# Patient Record
Sex: Female | Born: 1990 | Race: White | Hispanic: No | Marital: Single | Smoking: Never smoker
Health system: Southern US, Community
[De-identification: ages and names within clinical notes are randomized; demographics above are authoritative.]

## PROBLEM LIST (undated history)

## (undated) DIAGNOSIS — E66811 Obesity, class 1: Secondary | ICD-10-CM

## (undated) DIAGNOSIS — Z8742 Personal history of other diseases of the female genital tract: Secondary | ICD-10-CM

## (undated) DIAGNOSIS — T8859XA Other complications of anesthesia, initial encounter: Secondary | ICD-10-CM

## (undated) DIAGNOSIS — Z8619 Personal history of other infectious and parasitic diseases: Secondary | ICD-10-CM

## (undated) DIAGNOSIS — Z9289 Personal history of other medical treatment: Secondary | ICD-10-CM

## (undated) DIAGNOSIS — E119 Type 2 diabetes mellitus without complications: Secondary | ICD-10-CM

## (undated) DIAGNOSIS — Z8489 Family history of other specified conditions: Secondary | ICD-10-CM

## (undated) DIAGNOSIS — E669 Obesity, unspecified: Secondary | ICD-10-CM

## (undated) DIAGNOSIS — E7800 Pure hypercholesterolemia, unspecified: Secondary | ICD-10-CM

## (undated) DIAGNOSIS — T4145XA Adverse effect of unspecified anesthetic, initial encounter: Secondary | ICD-10-CM

## (undated) HISTORY — DX: Personal history of other diseases of the female genital tract: Z87.42

## (undated) HISTORY — DX: Type 2 diabetes mellitus without complications: E11.9

## (undated) HISTORY — DX: Personal history of other medical treatment: Z92.89

## (undated) HISTORY — DX: Obesity, class 1: E66.811

## (undated) HISTORY — DX: Obesity, unspecified: E66.9

## (undated) HISTORY — PX: VAGINA SURGERY: SHX829

## (undated) HISTORY — DX: Personal history of other infectious and parasitic diseases: Z86.19

## (undated) HISTORY — PX: WISDOM TOOTH EXTRACTION: SHX21

---
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
## 2010-05-29 ENCOUNTER — Emergency Department: Payer: Self-pay | Admitting: Emergency Medicine

## 2010-07-15 ENCOUNTER — Ambulatory Visit: Payer: Self-pay

## 2010-08-13 ENCOUNTER — Ambulatory Visit: Payer: Self-pay

## 2012-09-28 ENCOUNTER — Ambulatory Visit: Payer: Self-pay | Admitting: Unknown Physician Specialty

## 2013-07-16 ENCOUNTER — Emergency Department: Payer: Self-pay | Admitting: Emergency Medicine

## 2013-07-16 LAB — URINALYSIS, COMPLETE
BACTERIA: NONE SEEN
Bilirubin,UR: NEGATIVE
Glucose,UR: NEGATIVE mg/dL (ref 0–75)
NITRITE: NEGATIVE
Ph: 6 (ref 4.5–8.0)
Protein: NEGATIVE
SPECIFIC GRAVITY: 1.021 (ref 1.003–1.030)
WBC UR: 3 /HPF (ref 0–5)

## 2013-07-16 LAB — CBC
HCT: 36.6 % (ref 35.0–47.0)
HGB: 12.4 g/dL (ref 12.0–16.0)
MCH: 28.9 pg (ref 26.0–34.0)
MCHC: 33.8 g/dL (ref 32.0–36.0)
MCV: 85 fL (ref 80–100)
PLATELETS: 317 10*3/uL (ref 150–440)
RBC: 4.29 10*6/uL (ref 3.80–5.20)
RDW: 14.1 % (ref 11.5–14.5)
WBC: 7.2 10*3/uL (ref 3.6–11.0)

## 2013-07-16 LAB — BASIC METABOLIC PANEL
Anion Gap: 8 (ref 7–16)
BUN: 17 mg/dL (ref 7–18)
CREATININE: 0.8 mg/dL (ref 0.60–1.30)
Calcium, Total: 8.6 mg/dL (ref 8.5–10.1)
Chloride: 107 mmol/L (ref 98–107)
Co2: 22 mmol/L (ref 21–32)
EGFR (African American): >60
EGFR (Non-African Amer.): >60
Glucose: 75 mg/dL (ref 65–99)
Osmolality: 274 (ref 275–301)
Potassium: 3.2 mmol/L — ABNORMAL LOW (ref 3.5–5.1)
Sodium: 137 mmol/L (ref 136–145)

## 2013-07-16 LAB — HCG, QUANTITATIVE, PREGNANCY: Beta Hcg, Quant.: 4169 m[IU]/mL — ABNORMAL HIGH

## 2013-07-16 LAB — GC/CHLAMYDIA PROBE AMP

## 2013-07-16 LAB — WET PREP, GENITAL

## 2013-10-19 ENCOUNTER — Emergency Department: Payer: Self-pay | Admitting: Emergency Medicine

## 2013-10-20 LAB — BASIC METABOLIC PANEL
Anion Gap: 7 (ref 7–16)
BUN: 19 mg/dL — ABNORMAL HIGH (ref 7–18)
Calcium, Total: 8.9 mg/dL (ref 8.5–10.1)
Chloride: 104 mmol/L (ref 98–107)
Co2: 28 mmol/L (ref 21–32)
Creatinine: 0.91 mg/dL (ref 0.60–1.30)
EGFR (Non-African Amer.): >60
Glucose: 77 mg/dL (ref 65–99)
Osmolality: 279 (ref 275–301)
POTASSIUM: 3.8 mmol/L (ref 3.5–5.1)
Sodium: 139 mmol/L (ref 136–145)

## 2013-10-20 LAB — CBC
HCT: 40 % (ref 35.0–47.0)
HGB: 13 g/dL (ref 12.0–16.0)
MCH: 28.7 pg (ref 26.0–34.0)
MCHC: 32.6 g/dL (ref 32.0–36.0)
MCV: 88 fL (ref 80–100)
Platelet: 291 10*3/uL (ref 150–440)
RBC: 4.54 10*6/uL (ref 3.80–5.20)
RDW: 13.5 % (ref 11.5–14.5)
WBC: 7.4 10*3/uL (ref 3.6–11.0)

## 2013-10-20 LAB — TROPONIN I: Troponin-I: <0.02 ng/mL

## 2014-01-12 ENCOUNTER — Encounter: Payer: Self-pay | Admitting: Maternal & Fetal Medicine

## 2014-02-28 ENCOUNTER — Ambulatory Visit: Payer: Self-pay | Admitting: Obstetrics and Gynecology

## 2014-03-07 ENCOUNTER — Encounter: Payer: Self-pay | Admitting: Pediatric Cardiology

## 2014-03-14 ENCOUNTER — Ambulatory Visit: Payer: Self-pay | Admitting: Obstetrics and Gynecology

## 2014-03-15 ENCOUNTER — Observation Stay: Payer: Self-pay

## 2014-03-15 LAB — URINALYSIS, COMPLETE
BILIRUBIN, UR: NEGATIVE
Blood: NEGATIVE
Glucose,UR: NEGATIVE mg/dL (ref 0–75)
Hyaline Cast: 2
Ketone: NEGATIVE
NITRITE: NEGATIVE
PH: 7 (ref 4.5–8.0)
Protein: NEGATIVE
SPECIFIC GRAVITY: 1.014 (ref 1.003–1.030)
WBC UR: 64 /HPF (ref 0–5)

## 2014-04-18 ENCOUNTER — Observation Stay: Payer: Self-pay | Admitting: Obstetrics and Gynecology

## 2014-04-18 LAB — FETAL FIBRONECTIN
Appearance: NORMAL
Fetal Fibronectin: NEGATIVE

## 2014-04-18 LAB — URINALYSIS, COMPLETE
BLOOD: NEGATIVE
Bilirubin,UR: NEGATIVE
Glucose,UR: 50 mg/dL (ref 0–75)
Ketone: NEGATIVE
NITRITE: NEGATIVE
PH: 7 (ref 4.5–8.0)
PROTEIN: NEGATIVE
RBC,UR: 2 /HPF (ref 0–5)
SPECIFIC GRAVITY: 1.008 (ref 1.003–1.030)
Squamous Epithelial: 1
WBC UR: 7 /HPF (ref 0–5)

## 2014-04-18 LAB — WET PREP, GENITAL

## 2014-04-18 LAB — GC/CHLAMYDIA PROBE AMP

## 2014-04-20 LAB — URINE CULTURE

## 2014-05-13 ENCOUNTER — Observation Stay: Payer: Self-pay

## 2014-05-13 LAB — URINALYSIS, COMPLETE
Bilirubin,UR: NEGATIVE
Glucose,UR: NEGATIVE mg/dL (ref 0–75)
NITRITE: NEGATIVE
PROTEIN: NEGATIVE
Ph: 7 (ref 4.5–8.0)
Specific Gravity: 1.008 (ref 1.003–1.030)
Squamous Epithelial: 6

## 2014-05-15 LAB — URINE CULTURE

## 2014-06-01 ENCOUNTER — Observation Stay: Payer: Self-pay | Admitting: Obstetrics and Gynecology

## 2014-06-29 ENCOUNTER — Inpatient Hospital Stay: Payer: Self-pay | Admitting: Obstetrics and Gynecology

## 2014-08-22 NOTE — H&P (Signed)
L&D Evaluation:  History:  HPI 24 y/o G2P0010 @ 32wks EDC 07/07/14. Here with complaints of pelvic pressure, cramping and pink spotting with wiping. Care @ [facility]. Pregnancy complicated by Type I DM. 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 Denies leaking fluid or red vaginal bleeding, uti sx, baby is active. No recent IC. Blood sugars within normal range.   Presents with above   Patient's Medical History Diabetes  Obesity   Patient's Surgical History none   Medications Pre Natal Vitamins   Allergies NKDA   Social History none   Family History Non-Contributory   ROS:  ROS All systems were reviewed.  HEENT, CNS, GI, GU, Respiratory, CV, Renal and Musculoskeletal systems were found to be normal.   Exam:  Vital Signs stable   Urine Protein not completed   General no apparent distress   Mental Status clear   Chest clear   Heart normal sinus rhythm   Abdomen gravid, non-tender   Estimated Fetal Weight Average for gestational age   Fundal Height 32wks   Edema no edema   Reflexes 1+   Clonus negative   Pelvic no external lesions, cervix closed and thick, LTC PPOOP BOWI  no pink or red bleeding   Mebranes Intact   FHT normal rate with no decels, baseline 130's 140's avg variabililty nl accels   Fetal Heart Rate 136   Ucx rare irritability   Impression:  Impression 32wks cramping   Plan:  Plan EFM/NST, monitor contractions and for cervical change   Comments Occasional irritability per EFM, uterus soft. UA returned 1+ blood, negative protein, leukocytes 2+, bacteria 1+ epithelial 6. Will save for culture. Advised working no longer than 8hrs per shift, which may reduce as pregnancy progresses. Note given for rest tomorrow. Has BW NST and growth US this week. Normal blood sugars at office 05/12/14.   Electronic Signatures: Albertina ParrLugiano, Jashanti Clinkscale B (CNM)  (Signed 30-Jan-16 16:07)  Authored: L&D Evaluation   Last Updated: 30-Jan-16 16:07 by Albertina ParrLugiano, Setsuko Robins B  (CNM)

## 2014-08-22 NOTE — H&P (Signed)
L&D Evaluation:  History:  HPI 24 yo G2P0010 at 38+6 by 14 wk US with T1DM presenting for IOL. No complaints currently. FSBS have been well controlled on Humalog 4/4/8 and Lantus 22 QHS.   Patient's Medical History Diabetes  HLD, Obesity, Kidney stones, mild mitral reguritation   Patient's Surgical History none   Medications Pre Natal Vitamins   Allergies PCN, hives   Social History none   Family History Non-Contributory   ROS:  ROS All systems were reviewed.  HEENT, CNS, GI, GU, Respiratory, CV, Renal and Musculoskeletal systems were found to be normal.   Exam:  Vital Signs stable   General no apparent distress   Mental Status clear   Chest clear   Heart normal sinus rhythm   Abdomen gravid, non-tender   Estimated Fetal Weight Average for gestational age, US 7049th percentile   Fetal Position cephalic   Back no CVAT   Pelvic no external lesions   Mebranes Intact   FHT normal rate with no decels   Plan:  Comments 1. IOL: Plan for cervidil overnight.  2. FWB: Reassuring 3. DM: Lantus 11 tonight. Check FSBS Q4 in early induction. Call MD if >110. Clear liquids.   Electronic Signatures: Areta HaberGoodman, David M (MD)  (Signed 17-Mar-16 20:47)  Authored: L&D Evaluation   Last Updated: 17-Mar-16 20:47 by Areta HaberGoodman, David M (MD)

## 2015-08-13 DIAGNOSIS — Z8619 Personal history of other infectious and parasitic diseases: Secondary | ICD-10-CM

## 2015-08-13 HISTORY — DX: Personal history of other infectious and parasitic diseases: Z86.19

## 2015-08-30 LAB — HM PAP SMEAR: HM Pap smear: NEGATIVE

## 2016-07-03 ENCOUNTER — Ambulatory Visit (INDEPENDENT_AMBULATORY_CARE_PROVIDER_SITE_OTHER): Payer: BLUE CROSS/BLUE SHIELD | Admitting: Obstetrics & Gynecology

## 2016-07-03 ENCOUNTER — Encounter: Payer: Self-pay | Admitting: Obstetrics & Gynecology

## 2016-07-03 VITALS — BP 100/60 | HR 102 | Ht 68.0 in | Wt 218.0 lb

## 2016-07-03 DIAGNOSIS — N939 Abnormal uterine and vaginal bleeding, unspecified: Secondary | ICD-10-CM | POA: Diagnosis not present

## 2016-07-03 NOTE — Progress Notes (Signed)
  HPI:      [name] is a 26 y.o. G1P1001 who LMP was No LMP recorded., presents today for a problem visit.  She complains of 2 day h/o bleeding w cramping after no period for > 1 year w Mirena (x 2 years).  Also some odor w bloody d/c. that  began 2 days ago and its severity is described as moderate.  She has no periods w Mirena, and her pain recently is described as mild menstrual cramping.  She has used the following for attempts at control: thin pad.  Previous evaluation: none. Prior Diagnosis: abnormal bleeding on hormone replacement therapy. Previous Treatment: IUD.  She is single partner, contraception - IUD.  Contraception: IUD. Hx of STDs: none. She is premenopausal.  PMHx: She  has a past medical history of Diabetes mellitus, type 2 (HCC); History of chlamydia; and History of vaginitis. Also,  has no past surgical history on file., family history includes Breast cancer in her paternal grandmother; Heart disease in her maternal grandmother; Hypertension in her mother; Prostate cancer in her paternal grandfather.,  reports that she has never smoked. She has never used smokeless tobacco. She reports that she drinks alcohol. She reports that she does not use drugs.  She has a current medication list which includes the following prescription(s): glucocom blood glucose monitor, insulin glargine, insulin pen needle, levonorgestrel, phentermine, humalog kwikpen, lantus solostar, and simvastatin. Also, is allergic to penicillins and amoxicillin.  Review of Systems  Constitutional: Negative for chills, fever and malaise/fatigue.  HENT: Negative for congestion, sinus pain and sore throat.   Eyes: Negative for blurred vision and pain.  Respiratory: Negative for cough and wheezing.   Cardiovascular: Negative for chest pain and leg swelling.  Gastrointestinal: Negative for abdominal pain, constipation, diarrhea, heartburn, nausea and vomiting.  Genitourinary: Negative for dysuria,  frequency, hematuria and urgency.  Musculoskeletal: Negative for back pain, joint pain, myalgias and neck pain.  Skin: Negative for itching and rash.  Neurological: Negative for dizziness, tremors and weakness.  Endo/Heme/Allergies: Does not bruise/bleed easily.  Psychiatric/Behavioral: Negative for depression. The patient is not nervous/anxious and does not have insomnia.     Objective: BP 100/60   Pulse (!) 102   Ht 5\' 8"  (1.727 m)   Wt 218 lb (98.9 kg)   BMI 33.15 kg/m  Physical Exam  Constitutional: She is oriented to person, place, and time. She appears well-developed and well-nourished. No distress.  Genitourinary: Vagina normal. Pelvic exam was performed with patient supine. There is no rash, tenderness or lesion on the right labia. There is no rash, tenderness or lesion on the left labia. No erythema or bleeding in the vagina.  Genitourinary Comments: IUD strings 1 cm  Abdominal: Soft. She exhibits no distension. There is no tenderness.  Musculoskeletal: Normal range of motion.  Neurological: She is alert and oriented to person, place, and time. No cranial nerve deficit.  Skin: Skin is warm and dry.  Psychiatric: She has a normal mood and affect.    ASSESSMENT/PLAN:  abnormal bleeding on hormone replacement therapy. On IUD.  No other s/sx Infection, mass, inflammation. Monitor for persistance of AUB.  US if so. IUD in place today.  Annamarie MajorPaul Dereke Neumann, MD, Merlinda FrederickFACOG [facility], [facility] 07/03/2016  4:32 PM

## 2016-07-21 ENCOUNTER — Encounter: Payer: Self-pay | Admitting: Certified Nurse Midwife

## 2016-07-21 ENCOUNTER — Ambulatory Visit (INDEPENDENT_AMBULATORY_CARE_PROVIDER_SITE_OTHER): Payer: BLUE CROSS/BLUE SHIELD | Admitting: Certified Nurse Midwife

## 2016-07-21 VITALS — BP 112/72 | HR 98 | Ht 67.0 in | Wt 216.0 lb

## 2016-07-21 DIAGNOSIS — R102 Pelvic and perineal pain: Secondary | ICD-10-CM

## 2016-07-21 DIAGNOSIS — N898 Other specified noninflammatory disorders of vagina: Secondary | ICD-10-CM

## 2016-07-21 DIAGNOSIS — B9689 Other specified bacterial agents as the cause of diseases classified elsewhere: Secondary | ICD-10-CM | POA: Diagnosis not present

## 2016-07-21 DIAGNOSIS — N750 Cyst of Bartholin's gland: Secondary | ICD-10-CM | POA: Diagnosis not present

## 2016-07-21 DIAGNOSIS — Z202 Contact with and (suspected) exposure to infections with a predominantly sexual mode of transmission: Secondary | ICD-10-CM | POA: Diagnosis not present

## 2016-07-21 DIAGNOSIS — N760 Acute vaginitis: Secondary | ICD-10-CM

## 2016-07-21 MED ORDER — TINIDAZOLE 500 MG PO TABS: 1000.0000 mg | ORAL_TABLET | Freq: Every day | ORAL | 0 refills | Status: AC

## 2016-07-21 MED ORDER — AZITHROMYCIN 500 MG PO TABS: 1000.0000 mg | ORAL_TABLET | Freq: Once | ORAL | 0 refills | Status: AC

## 2016-07-21 MED ORDER — CEFTRIAXONE SODIUM 250 MG IJ SOLR
250.0000 mg | Freq: Once | INTRAMUSCULAR | Status: AC
  Administered 2016-07-21: 250 mg via INTRAMUSCULAR

## 2016-07-21 NOTE — Progress Notes (Addendum)
  Gynecology STD Evaluation   Chief Complaint:  Chief Complaint  Patient presents with  . Exposure to STD    History of Present Illness: Patient is a 26 y.o. G1P1001 presents for STD testing.  Her boyfriend of 1 year has not been monogamous and has recently been diagnosed with gonorrhea. She has noticed some increased vaginal discharge and vaginal pain/discomfort. Denies vulvar itching or vaginal bleeding. Her current form of contraception is a Mirena IUD.   PMHx: She  has a past medical history of Diabetes mellitus, type 2 (HCC); History of chlamydia; and History of vaginitis. Also,  has no past surgical history on file., family history includes Breast cancer in her paternal grandmother; Heart disease in her maternal grandmother; Hypertension in her mother; Prostate cancer in her paternal grandfather.,  reports that she has never smoked. She has never used smokeless tobacco. She reports that she drinks alcohol. She reports that she does not use drugs. Allergies  Allergen Reactions  . Penicillins   . Amoxicillin Hives and Rash    (rash 09/1996)  Current medications include Toujeo, simvastatin, Jardiance, and insulin  Review of Systems  Constitutional: Negative for chills and fever.  Gastrointestinal: Negative for abdominal pain.  Genitourinary: Negative for dysuria.       Positive for vaginal discharge and vaginal pain. No vulvar itching or irritation  Skin: Negative for rash.     Objective: BP 112/72   Pulse 98   Ht  (1.702 m)   Wt 98 kg (216 lb)   BMI 33.83 kg/m  Physical Exam  Constitutional: She is oriented to person, place, and time. She appears well-developed and well-nourished. No distress.  Genitourinary:  Genitourinary Comments: Vulva: no inflammation or lesions Right Bartholin Cyst, acorn size (2cm), NT Vagina: thick grey mucopurulent discharge Cervix: IUD strings visible, no inflammation, NT Uterus: NSSC, mobile, NT Adnexa: no masses, NT  Abdominal: There  is no tenderness.  Neurological: She is alert and oriented to person, place, and time.  Skin: Skin is warm and dry.  Psychiatric: She has a normal mood and affect.   Wet prep positive for clue cells and TNTC WBCS. No hyphae or Trichimonas seen  Assessment: 26 y.o. G1P1001 with exposure to gonorrhea and mucopurulent discharge Bacterial vaginosis   Plan: 1) Aptima done  2) Rocephin 250 mgm IM now. Azithromycin 1 GM. Tindamax 1000 mgm daily x 5 days  3) Discussed use of condoms to prevent or reduce risk of STDs. No IC with current partner until 1 week after both have finished course of treatment.  4) If results are positive for gonorrhea, will need TOC in 1 month.  Farrel Conners, CNM

## 2016-07-24 LAB — CHLAMYDIA/GONOCOCCUS/TRICHOMONAS, NAA
CHLAMYDIA BY NAA: NEGATIVE
GONOCOCCUS BY NAA: POSITIVE — AB
Trich vag by NAA: NEGATIVE

## 2016-07-25 ENCOUNTER — Telehealth: Payer: Self-pay | Admitting: Certified Nurse Midwife

## 2016-07-25 NOTE — Telephone Encounter (Signed)
Patient called with results of STD testing: positive for gonorrhea. Has already been treated for [facility] with Rocephin and Azithromycin as has her partner who told her he had gonorrhea. Advised to return in 1 mos for TOC.

## 2016-07-29 ENCOUNTER — Encounter: Payer: Self-pay | Admitting: Certified Nurse Midwife

## 2016-07-29 DIAGNOSIS — N750 Cyst of Bartholin's gland: Secondary | ICD-10-CM | POA: Insufficient documentation

## 2016-07-29 DIAGNOSIS — A549 Gonococcal infection, unspecified: Secondary | ICD-10-CM | POA: Insufficient documentation

## 2016-07-29 LAB — POCT WET PREP (WET MOUNT): Trichomonas Wet Prep HPF POC: ABSENT

## 2016-09-18 ENCOUNTER — Encounter: Payer: Self-pay | Admitting: Certified Nurse Midwife

## 2016-09-18 ENCOUNTER — Ambulatory Visit (INDEPENDENT_AMBULATORY_CARE_PROVIDER_SITE_OTHER): Payer: BLUE CROSS/BLUE SHIELD | Admitting: Certified Nurse Midwife

## 2016-09-18 VITALS — BP 118/68 | HR 92 | Ht 67.0 in | Wt 221.0 lb

## 2016-09-18 DIAGNOSIS — Z113 Encounter for screening for infections with a predominantly sexual mode of transmission: Secondary | ICD-10-CM | POA: Diagnosis not present

## 2016-09-18 DIAGNOSIS — Z124 Encounter for screening for malignant neoplasm of cervix: Secondary | ICD-10-CM | POA: Diagnosis not present

## 2016-09-18 DIAGNOSIS — L089 Local infection of the skin and subcutaneous tissue, unspecified: Secondary | ICD-10-CM | POA: Diagnosis not present

## 2016-09-18 DIAGNOSIS — A749 Chlamydial infection, unspecified: Secondary | ICD-10-CM | POA: Insufficient documentation

## 2016-09-18 DIAGNOSIS — Z01419 Encounter for gynecological examination (general) (routine) without abnormal findings: Secondary | ICD-10-CM

## 2016-09-18 DIAGNOSIS — A599 Trichomoniasis, unspecified: Secondary | ICD-10-CM | POA: Insufficient documentation

## 2016-09-18 MED ORDER — SULFAMETHOXAZOLE-TRIMETHOPRIM 800-160 MG PO TABS: 1.0000 | ORAL_TABLET | Freq: Two times a day (BID) | ORAL | 0 refills | Status: DC

## 2016-09-18 NOTE — Progress Notes (Signed)
Gynecology Annual Exam  PCP: Dr Kathie RhodesS  Chief Complaint:  Chief Complaint  Patient presents with  . Gynecologic Exam    History of Present Illness:Laurie Ortiz presents today for her annual exam. She is a 26 year old Caucasian/White female, G1 P1001, whose LMP was 09/16/2016. She also complains of a tender mass on her left breast which "popped" and drained pus-type drainage, after which the tenderness started to resolve. She can feel the lump and would like it evaluated.   Her menses are irregular and lite and usually last 2 days on her Mirena  IUD.  She reports dysmenorrhea. She will occasionally use Motrin with relief. The patient's past medical history is significant for diabetes and hypercholesterolemia.  Since her last annual GYN exam dated 08/30/2015, she has been treated for Chlamydia and Trichimonas.  She is sexually active. She is currently using an IUD for contraception. The Mirena IUD was placed on 08/2014 elsewhere. She would like full STD testing with her Pap smear.  Her most recent pap smear was obtained 08/30/2015 and was negative.  Mammogram is not applicable. There is a positive history of breast cancer in her paternal grandmother. Genetic testing is not indicated. There is no family history of ovarian cancer. The patient does not do monthly self breast exams.  The patient does not smoke.  The patient does not drink alcohol.  The patient does not use illegal drugs.  The patient does exercise by walking 30 minutes daily and she goes to the gym.   She has a BMI of  34.61 kg/m2 The patient does get adequate calcium in her diet.  She has her labs managed by her PCP.    The patient denies current symptoms of depression.    Review of Systems: Review of Systems  Constitutional: Negative for chills, fever and weight loss.  HENT: Negative for congestion, sinus pain and sore throat.   Eyes: Negative for blurred vision and pain.  Respiratory: Negative for hemoptysis,  shortness of breath and wheezing.   Cardiovascular: Negative for chest pain, palpitations and leg swelling.  Gastrointestinal: Negative for abdominal pain, blood in stool, diarrhea, heartburn, nausea and vomiting.  Genitourinary: Negative for dysuria, frequency, hematuria and urgency.  Musculoskeletal: Negative for back pain, joint pain and myalgias.  Skin: Negative for itching and rash.       Positive for a draining mass on left breast  Neurological: Negative for dizziness, tingling and headaches.  Endo/Heme/Allergies: Negative for environmental allergies and polydipsia. Does not bruise/bleed easily.       Negative for hirsutism   Psychiatric/Behavioral: Negative for depression. The patient is not nervous/anxious and does not have insomnia.     Past Medical History:  Past Medical History:  Diagnosis Date  . Diabetes mellitus, type 2 (HCC)   . History of chlamydia   . History of vaginitis   . Obesity (BMI 30.0-34.9)     Past Surgical History:  Past Surgical History:  Procedure Laterality Date  . WISDOM TOOTH EXTRACTION      Family History:  Family History  Problem Relation Age of Onset  . Hypertension Mother   . Heart disease Maternal Grandmother   . Breast cancer Paternal Grandmother        breast cancer in late 40s-died from breast cancer  . Prostate cancer Paternal Grandfather   . Dementia Paternal Grandfather   . Parkinson's disease Paternal Grandfather     Social History:  Social History   Social History  .  Marital status: Single    Spouse name: N/A  . Number of children: 1  . Years of education: N/A   Occupational History  . Manager    Social History Main Topics  . Smoking status: Never Smoker  . Smokeless tobacco: Never Used  . Alcohol use Yes  . Drug use: No  . Sexual activity: Yes    Partners: Male    Birth control/ protection: IUD   Other Topics Concern  . Not on file   Social History Narrative  . No narrative on file    Allergies:    Allergies  Allergen Reactions  . Penicillins   . Amoxicillin Hives and Rash    (rash 09/1996)    Medications: Prior to Admission medications   Medication Sig Start Date End Date Taking? Authorizing Provider  Blood Glucose Monitoring Suppl (GLUCOCOM BLOOD GLUCOSE MONITOR) DEVI 1 each by XX route as directed. 09/19/13   [provider]  Insulin Pen Needle (FIFTY50 PEN NEEDLES) 31G X 8 MM MISC Use as directed four times daily 01/22/15   [provider]  JARDIANCE 25 MG TABS tablet  07/18/16   [provider]  levonorgestrel (MIRENA) 20 MCG/24HR IUD 1 each by Intrauterine route once.    [provider]  NOVOLOG MIX 70/30 FLEXPEN (70-30) 100 UNIT/ML FlexPen  07/18/16   [provider]  simvastatin (ZOCOR) 20 MG tablet  05/06/16   [provider]  TOUJEO SOLOSTAR 300 UNIT/ML SOPN  07/18/16   [provider]    Physical Exam Vitals: BP 118/68   Pulse 92   Ht 5\' 7"  (1.702 m)   Wt 221 lb (100.2 kg)   LMP 09/16/2016 (Exact Date)   BMI 34.61 kg/m   General: pleasant WF in NAD HEENT: normocephalic, anicteric. Pierced tongue Neck: no thyroid enlargement, no palpable nodules, no cervical lymphadenopathy  Pulmonary: No increased work of breathing, CTAB Cardiovascular: RRR, without murmur  Breast: Breast symmetrical, no tenderness, no palpable nodules or masses on right,  no skin or nipple retraction present, no nipple discharge. On left breast there is a healing boil on the areola at 2 o'clock. There is a dime sized indurated area surrounding the boil. No drainage from the area now. It appears that one of the [facility] glands was infected. NO axillary, infraclavicular or supraclavicular lymphadenopathy. Abdomen: Soft, non-tender, non-distended.  Umbilicus pierced.  No hepatomegaly or masses palpable. No evidence of hernia. Genitourinary:  External: Normal external female genitalia.  Normal urethral meatus, normal Bartholin's and Skene's  glands.    Vagina: Normal vaginal mucosa, no evidence of prolapse.    Cervix: Grossly normal in appearance, no bleeding, non-tender, IUD strings visible  Uterus: Retroverted, normal size, shape, and consistency, mobile, and non-tender  Adnexa: No adnexal masses, non-tender  Rectal: deferred  Lymphatic: no evidence of inguinal lymphadenopathy Extremities: no edema, erythema, or tenderness Neurologic: Grossly intact Psychiatric: mood appropriate, affect full Skin: multiple tatoos on back, upper abdomen    Assessment: 26 y.o. G1P1001 annual gyn exam Skin infection involving the areola on the left  Plan:   1) Breast cancer screening - recommend monthly self breast exam.   2) STI screening was offered and accepted.  3) Cervical cancer screening - Pap was done.  4) Contraception: Mirena in place  5) Routine healthcare maintenance including cholesterol  screening managed by PCP   6) RX for Bactrim DS for skin infection (should cover MRSA if present). Recommended soaks to the area also. FU if not resolving  over the next week or two or if sx worsen  Farrel Conners, CNM

## 2016-09-20 LAB — PAP IG, CT-NG, RFX HPV ALL
Chlamydia, Nuc. Acid Amp: NEGATIVE
GONOCOCCUS BY NUCLEIC ACID AMP: NEGATIVE
PAP SMEAR COMMENT: 0

## 2016-11-12 ENCOUNTER — Other Ambulatory Visit: Payer: Self-pay | Admitting: Obstetrics and Gynecology

## 2016-11-12 ENCOUNTER — Ambulatory Visit (INDEPENDENT_AMBULATORY_CARE_PROVIDER_SITE_OTHER): Payer: BLUE CROSS/BLUE SHIELD | Admitting: Obstetrics and Gynecology

## 2016-11-12 ENCOUNTER — Encounter: Payer: Self-pay | Admitting: Obstetrics and Gynecology

## 2016-11-12 VITALS — BP 120/80 | HR 92 | Ht 67.0 in | Wt 226.0 lb

## 2016-11-12 DIAGNOSIS — B9689 Other specified bacterial agents as the cause of diseases classified elsewhere: Secondary | ICD-10-CM

## 2016-11-12 DIAGNOSIS — Z113 Encounter for screening for infections with a predominantly sexual mode of transmission: Secondary | ICD-10-CM

## 2016-11-12 DIAGNOSIS — N760 Acute vaginitis: Secondary | ICD-10-CM | POA: Diagnosis not present

## 2016-11-12 LAB — POCT WET PREP WITH KOH
CLUE CELLS WET PREP PER HPF POC: POSITIVE
KOH Prep POC: POSITIVE — AB
Trichomonas, UA: NEGATIVE
YEAST WET PREP PER HPF POC: NEGATIVE

## 2016-11-12 MED ORDER — METRONIDAZOLE 500 MG PO TABS: ORAL_TABLET | ORAL | 0 refills | Status: DC

## 2016-11-12 NOTE — Progress Notes (Signed)
Chief Complaint  Patient presents with  . Vaginitis    HPI:      [name] is a 26 y.o. G1P1001 who LMP was No LMP recorded. Patient is not currently having periods (Reason: IUD)., presents today for increased vag d/c, mild irritation, mild pelvic discomfort during sex, no odor, for the past few wks. She was last treated 4/18 for BV with tindamax with relief. She has a hx of recurrent BV sx in the past, usually a few times a yr. Hx of chlamydia in the past, too. Pt has new sex partner since annual 6/18. She is not using condoms, no probiotic use. No other abx use.  She has DM that is controlled per pt report.   Past Medical History:  Diagnosis Date  . Diabetes mellitus, type 2 (HCC)   . History of chlamydia 08/2015  . History of Papanicolaou smear of cervix 04/22/13; 08/30/15   NEG CT/GC NEG; NEG CT-POS/GC/TR NEG  . History of vaginitis   . Obesity (BMI 30.0-34.9)     Past Surgical History:  Procedure Laterality Date  . WISDOM TOOTH EXTRACTION      Family History  Problem Relation Age of Onset  . Hypertension Mother   . Heart disease Maternal Grandmother   . Breast cancer Paternal Grandmother        breast cancer in late 40s-died from breast cancer  . Prostate cancer Paternal Grandfather   . Dementia Paternal Grandfather   . Parkinson's disease Paternal Grandfather     Social History   Social History  . Marital status: Single    Spouse name: N/A  . Number of children: 1  . Years of education: 7412   Occupational History  . Manager [employer]   Social History Main Topics  . Smoking status: Never Smoker  . Smokeless tobacco: Never Used  . Alcohol use Yes     Comment: OCC  . Drug use: No  . Sexual activity: Yes    Partners: Male    Birth control/ protection: IUD   Other Topics Concern  . Not on file   Social History Narrative  . No narrative on file     Current Outpatient Prescriptions:  .  Blood Glucose Monitoring Suppl (GLUCOCOM BLOOD GLUCOSE  MONITOR) DEVI, 1 each by XX route as directed., Disp: , Rfl:  .  Continuous Blood Gluc Sensor (FREESTYLE LIBRE SENSOR SYSTEM) MISC, , Disp: , Rfl:  .  Insulin Pen Needle (FIFTY50 PEN NEEDLES) 31G X 8 MM MISC, Use as directed four times daily, Disp: , Rfl:  .  JARDIANCE 25 MG TABS tablet, , Disp: , Rfl:  .  levonorgestrel (MIRENA) 20 MCG/24HR IUD, 1 each by Intrauterine route once., Disp: , Rfl:  .  metroNIDAZOLE (FLAGYL) 500 MG tablet, Take 1 tab BID for 7 days, Disp: 14 tablet, Rfl: 0 .  NOVOLOG MIX 70/30 FLEXPEN (70-30) 100 UNIT/ML FlexPen, , Disp: , Rfl:  .  simvastatin (ZOCOR) 20 MG tablet, , Disp: , Rfl:  .  sulfamethoxazole-trimethoprim (BACTRIM DS,SEPTRA DS) 800-160 MG tablet, Take 1 tablet by mouth 2 (two) times daily., Disp: 14 tablet, Rfl: 0 .  TOUJEO SOLOSTAR 300 UNIT/ML SOPN, , Disp: , Rfl:  .  TRULICITY 0.75 MG/0.5ML SOPN, , Disp: , Rfl:    ROS:  Review of Systems  Constitutional: Negative for fever.  Gastrointestinal: Negative for blood in stool, constipation, diarrhea, nausea and vomiting.  Genitourinary: Positive for dyspareunia and vaginal discharge. Negative for dysuria, flank pain,  frequency, hematuria, urgency, vaginal bleeding and vaginal pain.  Musculoskeletal: Negative for back pain.  Skin: Negative for rash.     OBJECTIVE:   Vitals:  BP 120/80   Pulse 92   Ht 5\' 7"  (1.702 m)   Wt 226 lb (102.5 kg)   BMI 35.40 kg/m   Physical Exam  Constitutional: She is oriented to person, place, and time and well-developed, well-nourished, and in no distress. Vital signs are normal.  Genitourinary: Uterus normal, cervix normal, right adnexa normal, left adnexa normal and vulva normal. Uterus is not enlarged. Cervix exhibits no motion tenderness and no tenderness. Right adnexum displays no mass and no tenderness. Left adnexum displays no mass and no tenderness. Vulva exhibits no erythema, no exudate, no lesion, no rash and no tenderness. Vagina exhibits no lesion. Thin   white and vaginal discharge found.  Neurological: She is oriented to person, place, and time.  Vitals reviewed.   Results: Results for orders placed or performed in visit on 11/12/16 (from the past 24 hour(s))  POCT Wet Prep with KOH     Status: Abnormal   Collection Time: 11/12/16 11:53 AM  Result Value Ref Range   Trichomonas, UA Negative    Clue Cells Wet Prep HPF POC pos    Epithelial Wet Prep HPF POC  Few, Moderate, Many, Too numerous to count   Yeast Wet Prep HPF POC neg    Bacteria Wet Prep HPF POC  Few   RBC Wet Prep HPF POC     WBC Wet Prep HPF POC     KOH Prep POC Positive (A) Negative     Assessment/Plan: Bacterial vaginosis - Rx flagyl. Will RF if sx recur due to hx of BV. Try condoms/probiotics. F/u prn. - Plan: POCT Wet Prep with KOH, metroNIDAZOLE (FLAGYL) 500 MG tablet  Screening for STD (sexually transmitted disease) - Plan: Chlamydia/Gonococcus/Trichomonas, NAA    Meds ordered this encounter  Medications  . metroNIDAZOLE (FLAGYL) 500 MG tablet    Sig: Take 1 tab BID for 7 days    Dispense:  14 tablet    Refill:  0      Return if symptoms worsen or fail to improve.  Nik Gorrell B. Rian Koon, PA-C 11/12/2016 11:54 AM

## 2016-11-15 LAB — CHLAMYDIA/GONOCOCCUS/TRICHOMONAS, NAA
CHLAMYDIA BY NAA: NEGATIVE
Gonococcus by NAA: NEGATIVE
Trich vag by NAA: NEGATIVE

## 2017-09-11 ENCOUNTER — Ambulatory Visit: Payer: BLUE CROSS/BLUE SHIELD | Admitting: Obstetrics & Gynecology

## 2017-11-24 ENCOUNTER — Encounter: Payer: Self-pay | Admitting: Obstetrics and Gynecology

## 2017-11-24 ENCOUNTER — Ambulatory Visit (INDEPENDENT_AMBULATORY_CARE_PROVIDER_SITE_OTHER): Payer: BLUE CROSS/BLUE SHIELD | Admitting: Obstetrics and Gynecology

## 2017-11-24 VITALS — BP 120/78 | HR 97 | Ht 66.0 in | Wt 238.0 lb

## 2017-11-24 DIAGNOSIS — R102 Pelvic and perineal pain: Secondary | ICD-10-CM | POA: Diagnosis not present

## 2017-11-24 DIAGNOSIS — R350 Frequency of micturition: Secondary | ICD-10-CM

## 2017-11-24 DIAGNOSIS — R81 Glycosuria: Secondary | ICD-10-CM

## 2017-11-24 DIAGNOSIS — Z30431 Encounter for routine checking of intrauterine contraceptive device: Secondary | ICD-10-CM

## 2017-11-24 DIAGNOSIS — N898 Other specified noninflammatory disorders of vagina: Secondary | ICD-10-CM

## 2017-11-24 LAB — POCT URINALYSIS DIPSTICK
Bilirubin, UA: NEGATIVE
GLUCOSE UA: POSITIVE — AB
Leukocytes, UA: NEGATIVE
Nitrite, UA: NEGATIVE
Protein, UA: NEGATIVE
RBC UA: NEGATIVE
SPEC GRAV UA: 1.015 (ref 1.010–1.025)
pH, UA: 5 (ref 5.0–8.0)

## 2017-11-24 LAB — POCT WET PREP WITH KOH
Clue Cells Wet Prep HPF POC: NEGATIVE
KOH Prep POC: NEGATIVE
TRICHOMONAS UA: NEGATIVE
Yeast Wet Prep HPF POC: NEGATIVE

## 2017-11-24 NOTE — Patient Instructions (Signed)
I value your feedback and entrusting us with your care. If you get a [facility] patient survey, I would appreciate you taking the time to let us know about your experience today. Thank you! 

## 2017-11-24 NOTE — Progress Notes (Signed)
Patient, No Pcp Per   Chief Complaint  Patient presents with  . Pelvic Pain    sharp pain and burning when urinates, light bleeding x's 2 days    HPI:      Ms. Felicity PellegriniChristine N Anselmo is a 27 y.o. G1P1001 who LMP was No LMP recorded. (Menstrual status: IUD)., presents today for vaginal pressure and pain for the past 4 days. Pain is intermittent and achy, not improved with NSAIDs. Pt had light spotting day before sx started. Pt has IUD, Mirena placed 08/30/14. Typically amenorrheic with IUD, occas cramping when menses due. She is sex active, no new partners. No postcoital bleeding/dyspareunia. Pt notes increased vag d/c with occas itch, no odor. Hx of BV in past. Has had urinary frequency/urgency/dsyuria. No hematuria, LBP, fevers.    Past Medical History:  Diagnosis Date  . Diabetes mellitus, type 2 (HCC)   . History of chlamydia 08/2015  . History of Papanicolaou smear of cervix 04/22/13; 08/30/15   NEG CT/GC NEG; NEG CT-POS/GC/TR NEG  . History of vaginitis   . Obesity (BMI 30.0-34.9)     Past Surgical History:  Procedure Laterality Date  . WISDOM TOOTH EXTRACTION      Family History  Problem Relation Age of Onset  . Hypertension Mother   . Heart disease Maternal Grandmother   . Breast cancer Paternal Grandmother        breast cancer in late 40s-died from breast cancer  . Prostate cancer Paternal Grandfather   . Dementia Paternal Grandfather   . Parkinson's disease Paternal Grandfather     Social History   Socioeconomic History  . Marital status: Single    Spouse name: Not on file  . Number of children: 1  . Years of education: 7112  . Highest education level: Not on file  Occupational History  . Occupation: Event organiserManager    Employer: [employer]  Social Needs  . Financial resource strain: Not on file  . Food insecurity:    Worry: Not on file    Inability: Not on file  . Transportation needs:    Medical: Not on file    Non-medical: Not on file  Tobacco Use  . Smoking  status: Never Smoker  . Smokeless tobacco: Never Used  Substance and Sexual Activity  . Alcohol use: Yes    Comment: OCC  . Drug use: No  . Sexual activity: Yes    Partners: Male    Birth control/protection: IUD    Comment: Mirena  Lifestyle  . Physical activity:    Days per week: Not on file    Minutes per session: Not on file  . Stress: Not on file  Relationships  . Social connections:    Talks on phone: Not on file    Gets together: Not on file    Attends religious service: Not on file    Active member of club or organization: Not on file    Attends meetings of clubs or organizations: Not on file    Relationship status: Not on file  . Intimate partner violence:    Fear of current or ex partner: Not on file    Emotionally abused: Not on file    Physically abused: Not on file    Forced sexual activity: Not on file  Other Topics Concern  . Not on file  Social History Narrative  . Not on file    Outpatient Medications Prior to Visit  Medication Sig Dispense Refill  . Blood Glucose Monitoring  Suppl (GLUCOCOM BLOOD GLUCOSE MONITOR) DEVI 1 each by XX route as directed.    . Continuous Blood Gluc Sensor (FREESTYLE LIBRE SENSOR SYSTEM) MISC     . Glucose Blood (COOL BLOOD GLUCOSE TEST STRIPS VI) Use to check blood sugars up to 6 times daily    . Insulin Pen Needle (FIFTY50 PEN NEEDLES) 31G X 8 MM MISC Use as directed four times daily    . JARDIANCE 25 MG TABS tablet     . levonorgestrel (MIRENA) 20 MCG/24HR IUD 1 each by Intrauterine route once.    Marland Kitchen NOVOLOG MIX 70/30 FLEXPEN (70-30) 100 UNIT/ML FlexPen     . simvastatin (ZOCOR) 20 MG tablet     . TOUJEO SOLOSTAR 300 UNIT/ML SOPN     . TRULICITY 0.75 MG/0.5ML SOPN     . metroNIDAZOLE (FLAGYL) 500 MG tablet Take 1 tab BID for 7 days 14 tablet 0  . sulfamethoxazole-trimethoprim (BACTRIM DS,SEPTRA DS) 800-160 MG tablet Take 1 tablet by mouth 2 (two) times daily. 14 tablet 0   No facility-administered medications prior to  visit.       ROS:  Review of Systems  Constitutional: Negative for fatigue, fever and unexpected weight change.  Respiratory: Negative for cough, shortness of breath and wheezing.   Cardiovascular: Negative for chest pain, palpitations and leg swelling.  Gastrointestinal: Negative for blood in stool, constipation, diarrhea, nausea and vomiting.  Endocrine: Negative for cold intolerance, heat intolerance and polyuria.  Genitourinary: Positive for dysuria, frequency, pelvic pain, urgency, vaginal bleeding and vaginal discharge. Negative for dyspareunia, flank pain, genital sores, hematuria, menstrual problem and vaginal pain.  Musculoskeletal: Negative for back pain, joint swelling and myalgias.  Skin: Negative for rash.  Neurological: Negative for dizziness, syncope, light-headedness, numbness and headaches.  Hematological: Negative for adenopathy.  Psychiatric/Behavioral: Negative for agitation, confusion, sleep disturbance and suicidal ideas. The patient is not nervous/anxious.    BREAST: No symptoms   OBJECTIVE:   Vitals:  BP 120/78   Pulse 97   Ht 5\' 6"  (1.676 m)   Wt 238 lb (108 kg)   BMI 38.41 kg/m   Physical Exam  Constitutional: She is oriented to person, place, and time. Vital signs are normal. She appears well-developed.  Neck: Normal range of motion.  Pulmonary/Chest: Effort normal.  Abdominal: There is no CVA tenderness.  Genitourinary: There is no rash, tenderness or lesion on the right labia. There is no rash, tenderness or lesion on the left labia. Uterus is tender. Uterus is not enlarged. Cervix exhibits no motion tenderness. Right adnexum displays no mass and no tenderness. Left adnexum displays no mass and no tenderness. No erythema or tenderness in the vagina. Vaginal discharge found.  Genitourinary Comments: IUD STRINGS IN CX  Musculoskeletal: Normal range of motion.  Neurological: She is alert and oriented to person, place, and time. No cranial nerve  deficit.  Psychiatric: She has a normal mood and affect. Her behavior is normal. Judgment and thought content normal.  Vitals reviewed.   Results: Results for orders placed or performed in visit on 11/24/17 (from the past 24 hour(s))  POCT Urinalysis Dipstick     Status: Abnormal   Collection Time: 11/24/17  9:39 AM  Result Value Ref Range   Color, UA yellow    Clarity, UA clear    Glucose, UA Positive (A) Negative   Bilirubin, UA neg    Ketones, UA mod    Spec Grav, UA 1.015 1.010 - 1.025   Blood, UA neg  pH, UA 5.0 5.0 - 8.0   Protein, UA Negative Negative   Urobilinogen, UA     Nitrite, UA neg    Leukocytes, UA Negative Negative   Appearance     Odor    POCT Wet Prep with KOH     Status: Normal   Collection Time: 11/24/17  9:40 AM  Result Value Ref Range   Trichomonas, UA Negative    Clue Cells Wet Prep HPF POC neg    Epithelial Wet Prep HPF POC     Yeast Wet Prep HPF POC neg    Bacteria Wet Prep HPF POC     RBC Wet Prep HPF POC     WBC Wet Prep HPF POC     KOH Prep POC Negative Negative     Assessment/Plan: Pelvic pain - Tender on exam. Neg dip/wet prep. Check GYN u/s. Will call with results.  - Plan: US PELVIS TRANSVANGINAL NON-OB (TV ONLY), Urine Culture  Vaginal discharge - Neg wet prep. Reassurance. - Plan: POCT Wet Prep with KOH  Urinary frequency - Neg dip. Check C&S. Will call with results.  - Plan: POCT Urinalysis Dipstick, Urine Culture  Encounter for routine checking of intrauterine contraceptive device (IUD) - IUD strings in place. Chk placement with u/s.   Glucosuria - Pt with DM, not well controlled per pt report.      Return in about 1 day (around 11/25/2017) for GYN u/s for pelvic pain--[facility] to call pt.  Clair Alfieri B. Gricel Copen, PA-C 11/24/2017 9:43 AM

## 2017-11-26 ENCOUNTER — Telehealth: Payer: Self-pay | Admitting: Obstetrics and Gynecology

## 2017-11-26 DIAGNOSIS — N3000 Acute cystitis without hematuria: Secondary | ICD-10-CM

## 2017-11-26 LAB — URINE CULTURE

## 2017-11-26 MED ORDER — LEVOFLOXACIN 250 MG PO TABS: 250.0000 mg | ORAL_TABLET | Freq: Every day | ORAL | 0 refills | Status: DC

## 2017-11-26 NOTE — Telephone Encounter (Signed)
LM with pos GBS on UTI culture. Rx levaquin since PCN allergy. Hold on GYN u/s till UTI tx to see if cause of pelvic pain. F/u prn.

## 2017-12-17 ENCOUNTER — Ambulatory Visit (INDEPENDENT_AMBULATORY_CARE_PROVIDER_SITE_OTHER): Payer: BLUE CROSS/BLUE SHIELD | Admitting: Obstetrics & Gynecology

## 2017-12-17 ENCOUNTER — Other Ambulatory Visit (HOSPITAL_COMMUNITY)
Admission: RE | Admit: 2017-12-17 | Discharge: 2017-12-17 | Disposition: A | Discharge status: Home / Self Care | Payer: Self-pay | Source: Ambulatory Visit | Attending: Obstetrics & Gynecology | Admitting: Obstetrics & Gynecology

## 2017-12-17 ENCOUNTER — Encounter: Payer: Self-pay | Admitting: Obstetrics & Gynecology

## 2017-12-17 VITALS — BP 130/80 | Ht 66.0 in | Wt 230.0 lb

## 2017-12-17 DIAGNOSIS — R300 Dysuria: Secondary | ICD-10-CM | POA: Diagnosis not present

## 2017-12-17 DIAGNOSIS — N760 Acute vaginitis: Secondary | ICD-10-CM

## 2017-12-17 LAB — POCT URINALYSIS DIPSTICK
BILIRUBIN UA: NEGATIVE
Glucose, UA: POSITIVE — AB
Ketones, UA: POSITIVE
Leukocytes, UA: NEGATIVE
Nitrite, UA: NEGATIVE
PH UA: 6.5 (ref 5.0–8.0)
Protein, UA: POSITIVE — AB
RBC UA: NEGATIVE
Spec Grav, UA: 1.02 (ref 1.010–1.025)
UROBILINOGEN UA: 0.2 U/dL

## 2017-12-17 MED ORDER — METRONIDAZOLE 0.75 % VA GEL: 1.0000 | Freq: Every day | VAGINAL | 0 refills | Status: AC

## 2017-12-17 MED ORDER — NITROFURANTOIN MONOHYD MACRO 100 MG PO CAPS: 100.0000 mg | ORAL_CAPSULE | Freq: Two times a day (BID) | ORAL | 0 refills | Status: DC

## 2017-12-17 NOTE — Progress Notes (Signed)
HPI:      [name] is a 27 y.o. G1P1001 who LMP was No LMP recorded. (Menstrual status: IUD)., presents today for a problem visit.  She complains of:  UTI:  Pt was treated 2 weeks ago for GNS UTI w Levoquin, and feels she has a recurrence w 3 days of dysuria, shooting bladder pains, and frequency.  Vaginitis: Patient complains of an abnormal vaginal odor after sex last night,  for 1 day. Vaginal symptoms include scant d/c and odor..Vulvar symptoms include none.STI Risk: Very low risk of STD exposureDischarge described as: normal and physiologic.Other associated symptoms: none.Menstrual pattern: She had been bleeding none due to IUD. Contraception: IUD  PMHx: She  has a past medical history of Diabetes mellitus, type 2 (HCC), History of chlamydia (08/2015), History of Papanicolaou smear of cervix (04/22/13; 08/30/15), History of vaginitis, and Obesity (BMI 30.0-34.9). Also,  has a past surgical history that includes Wisdom tooth extraction., family history includes Breast cancer in her paternal grandmother; Dementia in her paternal grandfather; Heart disease in her maternal grandmother; Hypertension in her mother; Parkinson's disease in her paternal grandfather; Prostate cancer in her paternal grandfather.,  reports that she has never smoked. She has never used smokeless tobacco. She reports that she drinks alcohol. She reports that she does not use drugs.  She has a current medication list which includes the following prescription(s): glucocom blood glucose monitor, freestyle libre sensor system, glucose blood, insulin pen needle, jardiance, levonorgestrel, metronidazole, nitrofurantoin (macrocrystal-monohydrate), novolog mix 70/30 flexpen, simvastatin, toujeo solostar, and trulicity. Also, is allergic to penicillins and amoxicillin.  Review of Systems  Constitutional: Negative for chills, fever and malaise/fatigue.  HENT: Negative for congestion, sinus pain and sore throat.   Eyes:  Negative for blurred vision and pain.  Respiratory: Negative for cough and wheezing.   Cardiovascular: Negative for chest pain and leg swelling.  Gastrointestinal: Negative for abdominal pain, constipation, diarrhea, heartburn, nausea and vomiting.  Genitourinary: Negative for dysuria, frequency, hematuria and urgency.  Musculoskeletal: Negative for back pain, joint pain, myalgias and neck pain.  Skin: Negative for itching and rash.  Neurological: Negative for dizziness, tremors and weakness.  Endo/Heme/Allergies: Does not bruise/bleed easily.  Psychiatric/Behavioral: Negative for depression. The patient is not nervous/anxious and does not have insomnia.     Objective: BP 130/80   Ht 5\' 6"  (1.676 m)   Wt 230 lb (104.3 kg)   BMI 37.12 kg/m  Physical Exam  Constitutional: She is oriented to person, place, and time. She appears well-developed and well-nourished. No distress.  Genitourinary: Vagina normal and uterus normal. Pelvic exam was performed with patient supine. There is no rash, tenderness or lesion on the right labia. There is no rash, tenderness or lesion on the left labia. No erythema or bleeding in the vagina. Right adnexum does not display mass and does not display tenderness. Left adnexum does not display mass and does not display tenderness. Cervix does not exhibit motion tenderness, discharge, polyp or nabothian cyst.   Uterus is mobile and midaxial. Uterus is not enlarged or exhibiting a mass.  HENT:  Head: Normocephalic and atraumatic.  Nose: Nose normal.  Mouth/Throat: Oropharynx is clear and moist.  Abdominal: Soft. She exhibits no distension. There is no tenderness.  Musculoskeletal: Normal range of motion.  Neurological: She is alert and oriented to person, place, and time. No cranial nerve deficit.  Skin: Skin is warm and dry.  Psychiatric: She has a normal mood and affect.   Microscopic wet-mount exam shows  clue cells.  ASSESSMENT/PLAN:  BV and UTI (GBS)    Dysuria    -  Primary   Relevant Orders   POCT urinalysis dipstick (Completed)   Acute vaginitis       Relevant Orders   Cervicovaginal ancillary only    Likely recurrence of GBS UTI, will treat with best PCN-alternative: Macrobid  BV, Metrogel  Laurie Major, MD, [facility], [facility] 12/17/2017  2:55 PM

## 2017-12-17 NOTE — Patient Instructions (Addendum)
Nitrofurantoin tablets or capsules  What is this medicine?  NITROFURANTOIN (nye troe fyoor AN toyn) is an antibiotic. It is used to treat urinary tract infections.  This medicine may be used for other purposes; ask your health care provider or pharmacist if you have questions.  COMMON BRAND NAME(S): Macrobid, Macrodantin, Urotoin  What should I tell my health care provider before I take this medicine?  They need to know if you have any of these conditions:  -anemia  -diabetes  -glucose-6-phosphate dehydrogenase deficiency  -kidney disease  -liver disease  -lung disease  -other chronic illness  -an unusual or allergic reaction to nitrofurantoin, other antibiotics, other medicines, foods, dyes or preservatives  -pregnant or trying to get pregnant  -breast-feeding  How should I use this medicine?  Take this medicine by mouth with a glass of water. Follow the directions on the prescription label. Take this medicine with food or milk. Take your doses at regular intervals. Do not take your medicine more often than directed. Do not stop taking except on your doctor's advice.  Talk to your pediatrician regarding the use of this medicine in children. While this drug may be prescribed for selected conditions, precautions do apply.  Overdosage: If you think you have taken too much of this medicine contact a poison control center or emergency room at once.  NOTE: This medicine is only for you. Do not share this medicine with others.  What if I miss a dose?  If you miss a dose, take it as soon as you can. If it is almost time for your next dose, take only that dose. Do not take double or extra doses.  What may interact with this medicine?  -antacids containing magnesium trisilicate  -probenecid  -quinolone antibiotics like ciprofloxacin, lomefloxacin, norfloxacin and ofloxacin  -sulfinpyrazone  This list may not describe all possible interactions. Give your health care provider a list of all the medicines, herbs,  non-prescription drugs, or dietary supplements you use. Also tell them if you smoke, drink alcohol, or use illegal drugs. Some items may interact with your medicine.  What should I watch for while using this medicine?  Tell your doctor or health care professional if your symptoms do not improve or if you get new symptoms. Drink several glasses of water a day. If you are taking this medicine for a long time, visit your doctor for regular checks on your progress.  If you are diabetic, you may get a false positive result for sugar in your urine with certain brands of urine tests. Check with your doctor.  What side effects may I notice from receiving this medicine?  Side effects that you should report to your doctor or health care professional as soon as possible:  -allergic reactions like skin rash or hives, swelling of the face, lips, or tongue  -chest pain  -cough  -difficulty breathing  -dizziness, drowsiness  -fever or infection  -joint aches or pains  -pale or blue-tinted skin  -redness, blistering, peeling or loosening of the skin, including inside the mouth  -tingling, burning, pain, or numbness in hands or feet  -unusual bleeding or bruising  -unusually weak or tired  -yellowing of eyes or skin  Side effects that usually do not require medical attention (report to your doctor or health care professional if they continue or are bothersome):  -dark urine  -diarrhea  -headache  -loss of appetite  -nausea or vomiting  -temporary hair loss  This list may not describe all possible   medicine after the expiration date. NOTE: This sheet is a summary. It may not cover all possible information. If you have  questions about this medicine, talk to your doctor, pharmacist, or health care provider.  2018 Elsevier/Gold Standard (2007-10-20 15:56:47)  Metronidazole vaginal gel What is this medicine? METRONIDAZOLE (me troe NI da zole) VAGINAL GEL is an antiinfective. It is used to treat bacterial vaginitis. This medicine may be used for other purposes; ask your health care provider or pharmacist if you have questions. COMMON BRAND NAME(S): MetroGel, MetroGel Vaginal, MetroGel-Vaginal, NUVESSA, Vandazole What should I tell my health care provider before I take this medicine? They need to know if you have any of these conditions: -if you drink alcohol containing drinks -if you have taken disulfiram in the past two weeks -liver disease -peripheral neuropathy -seizures -an unusual or allergic reaction to metronidazole, parabens, nitroimidazoles, or other medicines, foods, dyes, or preservatives -pregnant or trying to get pregnant -breast-feeding How should I use this medicine? This medicine is only for use in the vagina. Do not take by mouth or apply to other areas of the body. Follow the directions on the prescription label. Wash hands before and after use. Screw the applicator to the tube and squeeze the tube gently to fill the applicator. Lie on your back, part and bend your knees. Insert the applicator tip high in the vagina and push the plunger to release the gel into the vagina. Gently remove the applicator. Wash the applicator well with warm water and soap. Use at regular intervals. Finish the full course prescribed by your doctor or health care professional even if you think your condition is better. Do not stop using except on the advice of your doctor or health care professional. Talk to your pediatrician regarding the use of this medicine in children. Special care may be needed. Overdosage: If you think you have taken too much of this medicine contact a poison control center or emergency room at  once. NOTE: This medicine is only for you. Do not share this medicine with others. What if I miss a dose? If you miss a dose, use it as soon as you can. If it is almost time for your next dose, use only that dose. Do not use double or extra doses. What may interact with this medicine? Do not take this medicine with any of the following medications: -alcohol or any product that contains alcohol -cisapride -dofetilide -dronedarone -pimozide -thioridazine -ziprasidone This medicine may also interact with the following medications: -cimetidine -lithium -other medicines that prolong the QT interval (cause an abnormal heart rhythm) -warfarin This list may not describe all possible interactions. Give your health care provider a list of all the medicines, herbs, non-prescription drugs, or dietary supplements you use. Also tell them if you smoke, drink alcohol, or use illegal drugs. Some items may interact with your medicine. What should I watch for while using this medicine? Tell your doctor or health care professional if your symptoms do not start to get better in 2 or 3 days. Avoid alcoholic drinks while you are taking this medicine and for three days afterwards. Alcohol may make you feel dizzy, sick, or flushed. You may get drowsy or dizzy. Do not drive, use machinery, or do anything that needs mental alertness until you know how this medicine affects you. To reduce the risk of dizzy or fainting spells, do not sit or stand up quickly, especially if you are an older patient. Your clothing may get soiled if you  have a vaginal discharge. You can wear a sanitary napkin. Do not use tampons. Wear freshly washed cotton, not synthetic, panties. Do not have sex until you have finished your treatment. Having sex can make the treatment less effective. Your sexual partner may also need treatment. What side effects may I notice from receiving this medicine? Side effects that you should report to your doctor  or health care professional as soon as possible: -dizziness -frequent passing of urine -headache -loss of appetite -nausea -skin rash, itching -stomach pain or cramps -vaginal irritation or discharge -vulvar burning or swelling Side effects that usually do not require medical attention (report to your doctor or health care professional if they continue or are bothersome): -dark urine -mild vaginal burning This list may not describe all possible side effects. Call your doctor for medical advice about side effects. You may report side effects to FDA at 1-800-FDA-1088. Where should I keep my medicine? Keep out of the reach of children. Store at room temperature between 15 and 30 degrees C (59 and 86 degrees F). Do not freeze. Throw away any unused medicine after the expiration date. NOTE: This sheet is a summary. It may not cover all possible information. If you have questions about this medicine, talk to your doctor, pharmacist, or health care provider.  2018 Elsevier/Gold Standard (2012-11-05 14:09:23)

## 2017-12-18 LAB — CERVICOVAGINAL ANCILLARY ONLY
Bacterial vaginitis: POSITIVE — AB
Candida vaginitis: NEGATIVE
Chlamydia: NEGATIVE
Neisseria Gonorrhea: NEGATIVE
TRICH (WINDOWPATH): NEGATIVE

## 2018-03-31 ENCOUNTER — Other Ambulatory Visit (HOSPITAL_COMMUNITY)
Admission: RE | Admit: 2018-03-31 | Discharge: 2018-03-31 | Disposition: A | Discharge status: Home / Self Care | Payer: Self-pay | Source: Ambulatory Visit | Attending: Obstetrics and Gynecology | Admitting: Obstetrics and Gynecology

## 2018-03-31 ENCOUNTER — Ambulatory Visit (INDEPENDENT_AMBULATORY_CARE_PROVIDER_SITE_OTHER): Payer: BLUE CROSS/BLUE SHIELD | Admitting: Obstetrics and Gynecology

## 2018-03-31 ENCOUNTER — Encounter: Payer: Self-pay | Admitting: Obstetrics and Gynecology

## 2018-03-31 VITALS — BP 106/80 | Ht 66.0 in | Wt 242.0 lb

## 2018-03-31 DIAGNOSIS — N921 Excessive and frequent menstruation with irregular cycle: Secondary | ICD-10-CM | POA: Diagnosis not present

## 2018-03-31 DIAGNOSIS — Z30431 Encounter for routine checking of intrauterine contraceptive device: Secondary | ICD-10-CM | POA: Diagnosis not present

## 2018-03-31 DIAGNOSIS — R102 Pelvic and perineal pain: Secondary | ICD-10-CM

## 2018-03-31 DIAGNOSIS — Z975 Presence of (intrauterine) contraceptive device: Secondary | ICD-10-CM

## 2018-03-31 DIAGNOSIS — R8290 Unspecified abnormal findings in urine: Secondary | ICD-10-CM | POA: Diagnosis not present

## 2018-03-31 DIAGNOSIS — Z113 Encounter for screening for infections with a predominantly sexual mode of transmission: Secondary | ICD-10-CM | POA: Insufficient documentation

## 2018-03-31 DIAGNOSIS — M545 Low back pain, unspecified: Secondary | ICD-10-CM

## 2018-03-31 LAB — POCT URINALYSIS DIPSTICK
Bilirubin, UA: NEGATIVE
Glucose, UA: NEGATIVE
Ketones, UA: NEGATIVE
Leukocytes, UA: NEGATIVE
NITRITE UA: NEGATIVE
PROTEIN UA: POSITIVE — AB
Spec Grav, UA: 1.025 (ref 1.010–1.025)

## 2018-03-31 LAB — POCT URINE PREGNANCY: PREG TEST UR: NEGATIVE

## 2018-03-31 NOTE — Progress Notes (Signed)
Patient, No Pcp Per   Chief Complaint  Patient presents with  . Metrorrhagia    woke up sunday with irregular bleeding, looked like period but wasnt per pts words, abdominal and lower back pain since that day, there is some clear discharge, no odor, or itchiness/irritation    HPI:      Laurie Ortiz is a 27 y.o. G1P1001 who LMP was No LMP recorded. (Menstrual status: IUD)., presents today for irreg bleeding for 4 days with cramping/dull ache. Flow starts and stops throughout the day. Pt has had increased LBP with dysuria, urine odor and dark urine for 4 days as well. No frequency/urgency/hematuria. No vag sx, GI sx. Had a new sex partner and wants STD testing. No bleeding/pain with sex. Hx of BV in past. Mirena placed 08/30/14. Usually amenorrheic, occas with spotting.    Past Medical History:  Diagnosis Date  . Diabetes mellitus, type 2 (HCC)   . History of chlamydia 08/2015  . History of Papanicolaou smear of cervix 04/22/13; 08/30/15   NEG CT/GC NEG; NEG CT-POS/GC/TR NEG  . History of vaginitis   . Obesity (BMI 30.0-34.9)     Past Surgical History:  Procedure Laterality Date  . WISDOM TOOTH EXTRACTION      Family History  Problem Relation Age of Onset  . Hypertension Mother   . Heart disease Maternal Grandmother   . Breast cancer Paternal Grandmother        breast cancer in late 40s-died from breast cancer  . Prostate cancer Paternal Grandfather   . Dementia Paternal Grandfather   . Parkinson's disease Paternal Grandfather     Social History   Socioeconomic History  . Marital status: Single    Spouse name: Not on file  . Number of children: 1  . Years of education: 40  . Highest education level: Not on file  Occupational History  . Occupation: Event organiser: [employer]  Social Needs  . Financial resource strain: Not on file  . Food insecurity:    Worry: Not on file    Inability: Not on file  . Transportation needs:    Medical: Not on file   Non-medical: Not on file  Tobacco Use  . Smoking status: Never Smoker  . Smokeless tobacco: Never Used  Substance and Sexual Activity  . Alcohol use: Yes    Comment: OCC  . Drug use: No  . Sexual activity: Yes    Partners: Male    Birth control/protection: I.U.D.    Comment: Mirena  Lifestyle  . Physical activity:    Days per week: Not on file    Minutes per session: Not on file  . Stress: Not on file  Relationships  . Social connections:    Talks on phone: Not on file    Gets together: Not on file    Attends religious service: Not on file    Active member of club or organization: Not on file    Attends meetings of clubs or organizations: Not on file    Relationship status: Not on file  . Intimate partner violence:    Fear of current or ex partner: Not on file    Emotionally abused: Not on file    Physically abused: Not on file    Forced sexual activity: Not on file  Other Topics Concern  . Not on file  Social History Narrative  . Not on file    Outpatient Medications Prior to Visit  Medication  Sig Dispense Refill  . Blood Glucose Monitoring Suppl (GLUCOCOM BLOOD GLUCOSE MONITOR) DEVI 1 each by XX route as directed.    . Continuous Blood Gluc Sensor (FREESTYLE LIBRE SENSOR SYSTEM) MISC     . Glucose Blood (COOL BLOOD GLUCOSE TEST STRIPS VI) Use to check blood sugars up to 6 times daily    . Insulin Pen Needle (FIFTY50 PEN NEEDLES) 31G X 8 MM MISC Use as directed four times daily    . JARDIANCE 25 MG TABS tablet     . levonorgestrel (MIRENA) 20 MCG/24HR IUD 1 each by Intrauterine route once.    . NOVOLOG MIMarland KitchenX 70/30 FLEXPEN (70-30) 100 UNIT/ML FlexPen     . simvastatin (ZOCOR) 20 MG tablet     . TOUJEO SOLOSTAR 300 UNIT/ML SOPN     . TRULICITY 0.75 MG/0.5ML SOPN     . nitrofurantoin, macrocrystal-monohydrate, (MACROBID) 100 MG capsule Take 1 capsule (100 mg total) by mouth 2 (two) times daily. 10 capsule 0   No facility-administered medications prior to visit.        ROS:  Review of Systems  Constitutional: Negative for fatigue, fever and unexpected weight change.  Respiratory: Negative for cough, shortness of breath and wheezing.   Cardiovascular: Negative for chest pain, palpitations and leg swelling.  Gastrointestinal: Negative for blood in stool, constipation, diarrhea, nausea and vomiting.  Endocrine: Negative for cold intolerance, heat intolerance and polyuria.  Genitourinary: Positive for dysuria, vaginal bleeding and vaginal discharge. Negative for dyspareunia, flank pain, frequency, genital sores, hematuria, menstrual problem, pelvic pain, urgency and vaginal pain.  Musculoskeletal: Positive for back pain. Negative for joint swelling and myalgias.  Skin: Negative for rash.  Neurological: Negative for dizziness, syncope, light-headedness, numbness and headaches.  Hematological: Negative for adenopathy.  Psychiatric/Behavioral: Negative for agitation, confusion, sleep disturbance and suicidal ideas. The patient is not nervous/anxious.     OBJECTIVE:   Vitals:  BP 106/80   Ht 5\' 6"  (1.676 m)   Wt 242 lb (109.8 kg)   BMI 39.06 kg/m   Physical Exam Vitals signs reviewed.  Constitutional:      Appearance: She is well-developed. She is not ill-appearing or toxic-appearing.  Neck:     Musculoskeletal: Normal range of motion.  Pulmonary:     Effort: Pulmonary effort is normal.  Abdominal:     General: There is no distension.     Palpations: There is no mass.     Tenderness: There is abdominal tenderness in the suprapubic area. There is no right CVA tenderness or left CVA tenderness.  Genitourinary:    Pubic Area: No rash.      Labia:        Right: No rash, tenderness or lesion.        Left: No rash, tenderness or lesion.      Vagina: Normal. No vaginal discharge, erythema or tenderness.     Cervix: Normal.     Uterus: Normal. Not enlarged and not tender.      Adnexa: Right adnexa normal and left adnexa normal.       Right:  No mass or tenderness.         Left: No mass or tenderness.    Musculoskeletal: Normal range of motion.  Neurological:     General: No focal deficit present.     Mental Status: She is alert and oriented to person, place, and time.     Cranial Nerves: No cranial nerve deficit.  Psychiatric:  Behavior: Behavior normal.        Thought Content: Thought content normal.        Judgment: Judgment normal.     Results: Results for orders placed or performed in visit on 03/31/18 (from the past 24 hour(s))  POCT Urinalysis Dipstick     Status: Abnormal   Collection Time: 03/31/18  1:38 PM  Result Value Ref Range   Color, UA yellow    Clarity, UA clear    Glucose, UA Negative Negative   Bilirubin, UA neg    Ketones, UA neg    Spec Grav, UA 1.025 1.010 - 1.025   Blood, UA mod    pH, UA     Protein, UA Positive (A) Negative   Urobilinogen, UA     Nitrite, UA neg    Leukocytes, UA Negative Negative   Appearance     Odor    POCT urine pregnancy     Status: Normal   Collection Time: 03/31/18  1:38 PM  Result Value Ref Range   Preg Test, Ur Negative Negative     Assessment/Plan: Breakthrough bleeding with IUD - Neg UPT. IUD strings in place. Most likely normal period. - Plan: POCT urine pregnancy  Screening for STD (sexually transmitted disease) - Will call with results. If pos, could be cause of bleeding. - Plan: Cervicovaginal ancillary only  Pelvic pain - Neg UPT, neg UA. Check gon/chlam. If neg and sx persist after menses, will check u/s.   Encounter for routine checking of intrauterine contraceptive device (IUD) - IUD strings in place  Abnormal urine odor - Neg UA. CHeck C&S. Will call with results.  - Plan: POCT Urinalysis Dipstick, Urine Culture  Acute midline low back pain without sciatica - Could be due to cramping. Check C&S. F/u prn.     Return if symptoms worsen or fail to improve.   B. , PA-C 03/31/2018 1:41 PM

## 2018-03-31 NOTE — Patient Instructions (Signed)
I value your feedback and entrusting us with your care. If you get a [facility] patient survey, I would appreciate you taking the time to let us know about your experience today. Thank you! 

## 2018-04-01 LAB — CERVICOVAGINAL ANCILLARY ONLY
Chlamydia: NEGATIVE
Neisseria Gonorrhea: NEGATIVE

## 2018-04-02 LAB — URINE CULTURE

## 2018-04-02 NOTE — Progress Notes (Signed)
Pls let pt know C&S and gon/chlam neg. F/u if sx persist. Next step is u/s.

## 2018-04-02 NOTE — Progress Notes (Signed)
Pt aware.

## 2018-04-21 ENCOUNTER — Ambulatory Visit: Payer: BLUE CROSS/BLUE SHIELD | Admitting: Advanced Practice Midwife

## 2018-05-07 ENCOUNTER — Ambulatory Visit: Payer: BLUE CROSS/BLUE SHIELD | Admitting: Advanced Practice Midwife

## 2018-06-30 ENCOUNTER — Encounter: Payer: Self-pay | Admitting: Emergency Medicine

## 2018-06-30 ENCOUNTER — Inpatient Hospital Stay
Admission: EM | Admit: 2018-06-30 | Discharge: 2018-07-02 | DRG: 639 | Disposition: A | Discharge status: Home / Self Care | Payer: BLUE CROSS/BLUE SHIELD | Source: Ambulatory Visit | Attending: Internal Medicine | Admitting: Internal Medicine

## 2018-06-30 ENCOUNTER — Other Ambulatory Visit: Payer: Self-pay

## 2018-06-30 ENCOUNTER — Inpatient Hospital Stay: Payer: Self-pay

## 2018-06-30 ENCOUNTER — Inpatient Hospital Stay: Payer: BLUE CROSS/BLUE SHIELD

## 2018-06-30 ENCOUNTER — Encounter: Payer: Self-pay | Admitting: Pulmonary Disease

## 2018-06-30 ENCOUNTER — Emergency Department: Payer: BLUE CROSS/BLUE SHIELD

## 2018-06-30 DIAGNOSIS — Z91138 Patient's unintentional underdosing of medication regimen for other reason: Secondary | ICD-10-CM | POA: Diagnosis not present

## 2018-06-30 DIAGNOSIS — T383X6A Underdosing of insulin and oral hypoglycemic [antidiabetic] drugs, initial encounter: Secondary | ICD-10-CM | POA: Diagnosis present

## 2018-06-30 DIAGNOSIS — E876 Hypokalemia: Secondary | ICD-10-CM | POA: Diagnosis present

## 2018-06-30 DIAGNOSIS — Z975 Presence of (intrauterine) contraceptive device: Secondary | ICD-10-CM

## 2018-06-30 DIAGNOSIS — Z794 Long term (current) use of insulin: Secondary | ICD-10-CM

## 2018-06-30 DIAGNOSIS — Z793 Long term (current) use of hormonal contraceptives: Secondary | ICD-10-CM | POA: Diagnosis not present

## 2018-06-30 DIAGNOSIS — Z880 Allergy status to penicillin: Secondary | ICD-10-CM | POA: Diagnosis not present

## 2018-06-30 DIAGNOSIS — Z8249 Family history of ischemic heart disease and other diseases of the circulatory system: Secondary | ICD-10-CM | POA: Diagnosis not present

## 2018-06-30 DIAGNOSIS — E119 Type 2 diabetes mellitus without complications: Secondary | ICD-10-CM

## 2018-06-30 DIAGNOSIS — E10649 Type 1 diabetes mellitus with hypoglycemia without coma: Secondary | ICD-10-CM | POA: Diagnosis not present

## 2018-06-30 DIAGNOSIS — M549 Dorsalgia, unspecified: Secondary | ICD-10-CM | POA: Diagnosis present

## 2018-06-30 DIAGNOSIS — E1010 Type 1 diabetes mellitus with ketoacidosis without coma: Principal | ICD-10-CM | POA: Diagnosis present

## 2018-06-30 DIAGNOSIS — Z79899 Other long term (current) drug therapy: Secondary | ICD-10-CM | POA: Diagnosis not present

## 2018-06-30 DIAGNOSIS — E1110 Type 2 diabetes mellitus with ketoacidosis without coma: Secondary | ICD-10-CM | POA: Diagnosis present

## 2018-06-30 LAB — LIPASE, BLOOD: Lipase: 23 U/L (ref 11–51)

## 2018-06-30 LAB — CBC
HCT: 55.7 % — ABNORMAL HIGH (ref 36.0–46.0)
Hemoglobin: 18 g/dL — ABNORMAL HIGH (ref 12.0–15.0)
MCH: 30.3 pg (ref 26.0–34.0)
MCHC: 32.3 g/dL (ref 30.0–36.0)
MCV: 93.8 fL (ref 80.0–100.0)
Platelets: 390 10*3/uL (ref 150–400)
RBC: 5.94 MIL/uL — AB (ref 3.87–5.11)
RDW: 12.7 % (ref 11.5–15.5)
WBC: 15.4 10*3/uL — ABNORMAL HIGH (ref 4.0–10.5)
nRBC: 0 % (ref 0.0–0.2)

## 2018-06-30 LAB — BASIC METABOLIC PANEL
Anion gap: 14 (ref 5–15)
Anion gap: 8 (ref 5–15)
BUN: 19 mg/dL (ref 6–20)
BUN: 17 mg/dL (ref 6–20)
BUN: 13 mg/dL (ref 6–20)
CO2: <7 mmol/L — ABNORMAL LOW (ref 22–32)
CO2: 8 mmol/L — AB (ref 22–32)
CO2: 11 mmol/L — ABNORMAL LOW (ref 22–32)
Calcium: 8.6 mg/dL — ABNORMAL LOW (ref 8.9–10.3)
Calcium: 7.4 mg/dL — ABNORMAL LOW (ref 8.9–10.3)
Calcium: 7.2 mg/dL — ABNORMAL LOW (ref 8.9–10.3)
Chloride: 108 mmol/L (ref 98–111)
Chloride: 113 mmol/L — ABNORMAL HIGH (ref 98–111)
Chloride: 117 mmol/L — ABNORMAL HIGH (ref 98–111)
Creatinine, Ser: 1.05 mg/dL — ABNORMAL HIGH (ref 0.44–1.00)
Creatinine, Ser: 0.63 mg/dL (ref 0.44–1.00)
Creatinine, Ser: 0.62 mg/dL (ref 0.44–1.00)
GFR calc Af Amer: >60 mL/min (ref >60)
GFR calc Af Amer: >60 mL/min (ref >60)
GFR calc non Af Amer: >60 mL/min (ref >60)
GFR calc non Af Amer: >60 mL/min (ref >60)
GFR calc non Af Amer: >60 mL/min (ref >60)
Glucose, Bld: 162 mg/dL — ABNORMAL HIGH (ref 70–99)
Glucose, Bld: 83 mg/dL (ref 70–99)
Glucose, Bld: 114 mg/dL — ABNORMAL HIGH (ref 70–99)
Potassium: 4.1 mmol/L (ref 3.5–5.1)
Potassium: 3.8 mmol/L (ref 3.5–5.1)
Potassium: 3.2 mmol/L — ABNORMAL LOW (ref 3.5–5.1)
Sodium: 134 mmol/L — ABNORMAL LOW (ref 135–145)
Sodium: 135 mmol/L (ref 135–145)
Sodium: 136 mmol/L (ref 135–145)

## 2018-06-30 LAB — URINALYSIS, COMPLETE (UACMP) WITH MICROSCOPIC
BILIRUBIN URINE: NEGATIVE
Bacteria, UA: NONE SEEN
Glucose, UA: >=500 mg/dL — AB
Ketones, ur: 80 mg/dL — AB
Leukocytes,Ua: NEGATIVE
Nitrite: NEGATIVE
Protein, ur: 100 mg/dL — AB
Specific Gravity, Urine: 1.018 (ref 1.005–1.030)
pH: 5 (ref 5.0–8.0)

## 2018-06-30 LAB — POCT PREGNANCY, URINE: Preg Test, Ur: NEGATIVE

## 2018-06-30 LAB — GLUCOSE, CAPILLARY
GLUCOSE-CAPILLARY: 96 mg/dL (ref 70–99)
Glucose-Capillary: 71 mg/dL (ref 70–99)
Glucose-Capillary: 82 mg/dL (ref 70–99)

## 2018-06-30 LAB — LACTIC ACID, PLASMA
Lactic Acid, Venous: 0.9 mmol/L (ref 0.5–1.9)
Lactic Acid, Venous: 1.8 mmol/L (ref 0.5–1.9)

## 2018-06-30 LAB — PROCALCITONIN: Procalcitonin: <0.1 ng/mL

## 2018-06-30 MED ORDER — SODIUM CHLORIDE 0.9% FLUSH
3.0000 mL | Freq: Two times a day (BID) | INTRAVENOUS | Status: DC
  Administered 2018-06-30 – 2018-07-01 (×2): 3 mL via INTRAVENOUS

## 2018-06-30 MED ORDER — ENOXAPARIN SODIUM 40 MG/0.4ML ~~LOC~~ SOLN: 40.0000 mg | SUBCUTANEOUS | Status: DC

## 2018-06-30 MED ORDER — SODIUM CHLORIDE 0.9 % IV SOLN
INTRAVENOUS | Status: AC
  Administered 2018-06-30: 18:00:00 via INTRAVENOUS

## 2018-06-30 MED ORDER — KETOROLAC TROMETHAMINE 15 MG/ML IJ SOLN
15.0000 mg | Freq: Four times a day (QID) | INTRAMUSCULAR | Status: DC | PRN
  Administered 2018-07-01: 15 mg via INTRAVENOUS
  Filled 2018-06-30: qty 1

## 2018-06-30 MED ORDER — KETOROLAC TROMETHAMINE 30 MG/ML IJ SOLN
30.0000 mg | Freq: Once | INTRAMUSCULAR | Status: AC
  Administered 2018-06-30: 30 mg via INTRAVENOUS
  Filled 2018-06-30: qty 1

## 2018-06-30 MED ORDER — ENOXAPARIN SODIUM 40 MG/0.4ML ~~LOC~~ SOLN
40.0000 mg | SUBCUTANEOUS | Status: DC
  Administered 2018-07-01: 40 mg via SUBCUTANEOUS
  Filled 2018-06-30: qty 0.4

## 2018-06-30 MED ORDER — INSULIN REGULAR(HUMAN) IN NACL 100-0.9 UT/100ML-% IV SOLN
INTRAVENOUS | Status: DC
  Administered 2018-06-30: 0.2 [IU]/h via INTRAVENOUS
  Filled 2018-06-30: qty 100

## 2018-06-30 MED ORDER — ACETAMINOPHEN 650 MG RE SUPP: 650.0000 mg | Freq: Four times a day (QID) | RECTAL | Status: DC | PRN

## 2018-06-30 MED ORDER — ACETAMINOPHEN 325 MG PO TABS
650.0000 mg | ORAL_TABLET | Freq: Four times a day (QID) | ORAL | Status: DC | PRN
  Administered 2018-07-02: 650 mg via ORAL
  Filled 2018-06-30: qty 2

## 2018-06-30 MED ORDER — MORPHINE SULFATE (PF) 2 MG/ML IV SOLN
1.0000 mg | INTRAVENOUS | Status: DC | PRN
  Administered 2018-06-30: 1 mg via INTRAVENOUS
  Filled 2018-06-30: qty 1

## 2018-06-30 MED ORDER — ONDANSETRON HCL 4 MG/2ML IJ SOLN
4.0000 mg | Freq: Four times a day (QID) | INTRAMUSCULAR | Status: DC | PRN
  Administered 2018-07-01: 4 mg via INTRAVENOUS
  Filled 2018-06-30 (×2): qty 2

## 2018-06-30 MED ORDER — SODIUM CHLORIDE 0.9 % IV BOLUS
2000.0000 mL | Freq: Once | INTRAVENOUS | Status: AC
  Administered 2018-06-30: 2000 mL via INTRAVENOUS

## 2018-06-30 MED ORDER — POTASSIUM CHLORIDE 10 MEQ/50ML IV SOLN
10.0000 meq | INTRAVENOUS | Status: AC
  Administered 2018-06-30 – 2018-07-01 (×4): 10 meq via INTRAVENOUS
  Filled 2018-06-30 (×4): qty 50

## 2018-06-30 MED ORDER — ALBUTEROL SULFATE (2.5 MG/3ML) 0.083% IN NEBU: 2.5000 mg | INHALATION_SOLUTION | RESPIRATORY_TRACT | Status: DC | PRN

## 2018-06-30 MED ORDER — POTASSIUM CHLORIDE 10 MEQ/100ML IV SOLN
10.0000 meq | INTRAVENOUS | Status: AC
  Administered 2018-06-30 (×2): 10 meq via INTRAVENOUS
  Filled 2018-06-30 (×2): qty 100

## 2018-06-30 MED ORDER — ONDANSETRON HCL 4 MG/2ML IJ SOLN
4.0000 mg | Freq: Once | INTRAMUSCULAR | Status: AC
  Administered 2018-06-30: 4 mg via INTRAVENOUS
  Filled 2018-06-30: qty 2

## 2018-06-30 MED ORDER — SODIUM CHLORIDE 0.9% FLUSH
10.0000 mL | Freq: Two times a day (BID) | INTRAVENOUS | Status: DC
  Administered 2018-06-30 – 2018-07-01 (×2): 30 mL
  Administered 2018-07-01: 10 mL

## 2018-06-30 MED ORDER — SODIUM CHLORIDE 0.9% FLUSH: 10.0000 mL | INTRAVENOUS | Status: DC | PRN

## 2018-06-30 MED ORDER — SODIUM CHLORIDE 0.9 % IV SOLN
1000.0000 mL | Freq: Once | INTRAVENOUS | Status: AC
  Administered 2018-06-30: 1000 mL via INTRAVENOUS

## 2018-06-30 MED ORDER — DEXTROSE-NACL 5-0.45 % IV SOLN
INTRAVENOUS | Status: DC
  Administered 2018-06-30 – 2018-07-01 (×2): via INTRAVENOUS

## 2018-06-30 MED ORDER — POLYETHYLENE GLYCOL 3350 17 G PO PACK: 17.0000 g | PACK | Freq: Every day | ORAL | Status: DC | PRN

## 2018-06-30 MED ORDER — SODIUM CHLORIDE 0.9 % IV SOLN: INTRAVENOUS | Status: DC

## 2018-06-30 MED ORDER — ONDANSETRON HCL 4 MG PO TABS: 4.0000 mg | ORAL_TABLET | Freq: Four times a day (QID) | ORAL | Status: DC | PRN

## 2018-06-30 NOTE — ED Notes (Addendum)
ED TO INPATIENT HANDOFF REPORT  ED Nurse Name and Phone #: Shanda Bumps [phone]  S Name/Age/Gender Laurie Ortiz 28 y.o. female Room/Bed: ED03A/ED03A  Code Status   Code Status: Full Code  Home/SNF/Other Home Patient oriented to: self, place, time and situation Is this baseline? Yes   Triage Complete: Triage complete  Chief Complaint kidney infection  Triage Note PT arrives with complaints of bilateral flank pain. [facility] whom sent pt to ED for further workup of possible pyelonephritis due to UA results.    Allergies Allergies  Allergen Reactions  . Penicillins     Doesn't take any medications ending in "cillin"  . Amoxicillin Hives and Rash    (rash 09/1996)    Level of Care/Admitting Diagnosis ED Disposition    ED Disposition Condition Comment   Admit  Hospital Area: [facility] [[id number]]  Level of Care: Stepdown [14]  Diagnosis: DKA (diabetic ketoacidoses) [facility]) [[id number]]  Admitting Physician: Milagros Loll [[id number]]  Attending Physician: Milagros Loll [phone]  Estimated length of stay: past midnight tomorrow  Certification:: I certify this patient will need inpatient services for at least 2 midnights  PT Class (Do Not Modify): Inpatient [101]  PT Acc Code (Do Not Modify): Private [1]       B Medical/Surgery History Past Medical History:  Diagnosis Date  . Diabetes mellitus, type 2 (HCC)   . History of chlamydia 08/2015  . History of Papanicolaou smear of cervix 04/22/13; 08/30/15   NEG CT/GC NEG; NEG CT-POS/GC/TR NEG  . History of vaginitis   . Obesity (BMI 30.0-34.9)    Past Surgical History:  Procedure Laterality Date  . WISDOM TOOTH EXTRACTION       A IV Location/Drains/Wounds Patient Lines/Drains/Airways Status   Active Line/Drains/Airways    Name:   Placement date:   Placement time:   Site:   Days:   Peripheral IV 06/30/18 Right Antecubital   06/30/18    1410    Antecubital   less than 1           Intake/Output Last 24 hours No intake or output data in the 24 hours ending 06/30/18 1636  Labs/Imaging Results for orders placed or performed during the hospital encounter of 06/30/18 (from the past 48 hour(s))  Basic metabolic panel     Status: Abnormal   Collection Time: 06/30/18  1:47 PM  Result Value Ref Range   Sodium 134 (L) 135 - 145 mmol/L    Comment: CORRECTED ON 03/18 AT 1536: PREVIOUSLY REPORTED AS 136   Potassium 4.1 3.5 - 5.1 mmol/L   Chloride 108 98 - 111 mmol/L    Comment: CORRECTED ON 03/18 AT 1536: PREVIOUSLY REPORTED AS 110   CO2 <7 (L) 22 - 32 mmol/L   Glucose, Bld 162 (H) 70 - 99 mg/dL    Comment: CORRECTED ON 03/18 AT 1536: PREVIOUSLY REPORTED AS 156   BUN 19 6 - 20 mg/dL   Creatinine, Ser 7.53 (H) 0.44 - 1.00 mg/dL    Comment: CORRECTED ON 03/18 AT 1536: PREVIOUSLY REPORTED AS 1.06   Calcium 8.6 (L) 8.9 - 10.3 mg/dL    Comment: CORRECTED ON 03/18 AT 1536: PREVIOUSLY REPORTED AS 8.7   GFR calc non Af Amer >60 >60 mL/min   GFR calc Af Amer >60 >60 mL/min    Comment: Performed at [facility], [address] [place], [place] [address]  CBC     Status: Abnormal   Collection Time:  06/30/18  1:47 PM  Result Value Ref Range   WBC 15.4 (H) 4.0 - 10.5 K/uL   RBC 5.94 (H) 3.87 - 5.11 MIL/uL   Hemoglobin 18.0 (H) 12.0 - 15.0 g/dL   HCT 16.155.7 (H) 09.636.0 - 04.546.0 %   MCV 93.8 80.0 - 100.0 fL   MCH 30.3 26.0 - 34.0 pg   MCHC 32.3 30.0 - 36.0 g/dL   RDW 40.912.7 81.111.5 - 91.415.5 %   Platelets 390 150 - 400 K/uL   nRBC 0.0 0.0 - 0.2 %    Comment: Performed at [facility], [address] [place], [place] [address]  Urinalysis, Complete w Microscopic     Status: Abnormal   Collection Time: 06/30/18  1:57 PM  Result Value Ref Range   Color, Urine YELLOW (A) YELLOW   APPearance CLEAR (A) CLEAR   Specific Gravity, Urine 1.018 1.005 - 1.030   pH 5.0 5.0 - 8.0   Glucose, UA >=500 (A) NEGATIVE mg/dL   Hgb urine dipstick MODERATE (A) NEGATIVE    Bilirubin Urine NEGATIVE NEGATIVE   Ketones, ur 80 (A) NEGATIVE mg/dL   Protein, ur 621100 (A) NEGATIVE mg/dL   Nitrite NEGATIVE NEGATIVE   Leukocytes,Ua NEGATIVE NEGATIVE   RBC / HPF 0-5 0 - 5 RBC/hpf   WBC, UA 0-5 0 - 5 WBC/hpf   Bacteria, UA NONE SEEN NONE SEEN   Squamous Epithelial / LPF 0-5 0 - 5   Mucus PRESENT     Comment: Performed at [facility], [address] [place], [place] [address]  Pregnancy, urine POC     Status: None   Collection Time: 06/30/18  2:09 PM  Result Value Ref Range   Preg Test, Ur NEGATIVE NEGATIVE    Comment:        THE SENSITIVITY OF THIS METHODOLOGY IS >24 mIU/mL   Blood gas, venous     Status: Abnormal (Preliminary result)   Collection Time: 06/30/18  3:51 PM  Result Value Ref Range   pH, Ven 7.03 (LL) 7.250 - 7.430    Comment: CRITICAL RESULT CALLED TO, READ BACK BY AND VERIFIED WITH: JESSICA RN [id number]    pCO2, Ven 24 (L) 44.0 - 60.0 mmHg   pO2, Ven PENDING 32.0 - 45.0 mmHg   Patient temperature 37.0    Collection site VENOUS    Sample type VENOUS     Comment: Performed at [facility], [address] [place], [place] [address]  Lactic acid, plasma     Status: None   Collection Time: 06/30/18  4:01 PM  Result Value Ref Range   Lactic Acid, Venous 0.9 0.5 - 1.9 mmol/L    Comment: Performed at [facility], [address] [place], [place] [address]   Ct Renal Stone Study  Result Date: 06/30/2018 CLINICAL DATA:  Bilateral flank pain. EXAM: CT ABDOMEN AND PELVIS WITHOUT CONTRAST TECHNIQUE: Multidetector CT imaging of the abdomen and pelvis was performed following the standard protocol without IV contrast. COMPARISON:  None. FINDINGS: Lower chest: Normal. Hepatobiliary: No focal liver abnormality is seen. No gallstones, gallbladder wall thickening, or biliary dilatation. Pancreas: Unremarkable. No pancreatic ductal dilatation or surrounding inflammatory changes. Spleen: Normal in size without focal abnormality.  Adrenals/Urinary Tract: Adrenal glands are unremarkable. Kidneys are normal, without renal calculi, focal lesion, or hydronephrosis. Bladder is unremarkable. Stomach/Bowel: Stomach is within normal limits. Appendix appears normal. No evidence of bowel wall thickening, distention, or inflammatory changes. Vascular/Lymphatic: No significant vascular findings are present.  No enlarged abdominal or pelvic lymph nodes. Reproductive: IUD in place. Uterus and ovaries are otherwise normal. Other: No abdominal wall hernia or abnormality. No abdominopelvic ascites. Musculoskeletal: The bones are normal other than a small ossified loose body which appears to be within the medial aspect of the left hip joint best seen on image 87 of series 5. IMPRESSION: Benign-appearing abdomen and pelvis. Small loose body in the left hip joint. Electronically Signed   By: Francene Boyers M.D.   On: 06/30/2018 15:09    Pending Labs Unresulted Labs (From admission, onward)    Start     Ordered   07/07/18 0500  Creatinine, serum  (enoxaparin (LOVENOX)    CrCl >/= 30 ml/min)  Weekly,   STAT    Comments:  while on enoxaparin therapy    06/30/18 1628   07/01/18 0500  CBC  Tomorrow morning,   STAT     06/30/18 1628   07/01/18 0500  Comprehensive metabolic panel  Tomorrow morning,   STAT     06/30/18 1628   06/30/18 1900  Basic metabolic panel  STAT Now then every 4 hours ,   STAT     06/30/18 1619   06/30/18 1628  HIV antibody (Routine Testing)  Add-on,   AD     06/30/18 1628   06/30/18 1556  Lactic acid, plasma  STAT Now then every 3 hours,   STAT     06/30/18 1555          Vitals/Pain Today's Vitals   06/30/18 1345 06/30/18 1412 06/30/18 1551 06/30/18 1600  BP:   (!) 150/73 (!) 147/84  Pulse: (!) 112  (!) 105 (!) 108  Resp:   20 (!) 22  Temp: (!) 97.5 F (36.4 C)     SpO2: 100%  100% 100%  Weight:      Height:      PainSc:  10-Worst pain ever      Isolation Precautions No active  isolations  Medications Medications  sodium chloride 0.9 % bolus 2,000 mL (has no administration in time range)  0.9 %  sodium chloride infusion (has no administration in time range)  dextrose 5 %-0.45 % sodium chloride infusion (has no administration in time range)  insulin regular, human (MYXREDLIN) 100 units/ 100 mL infusion (has no administration in time range)  potassium chloride 10 mEq in 100 mL IVPB (has no administration in time range)  enoxaparin (LOVENOX) injection 40 mg (has no administration in time range)  sodium chloride flush (NS) 0.9 % injection 3 mL (has no administration in time range)  acetaminophen (TYLENOL) tablet 650 mg (has no administration in time range)    Or  acetaminophen (TYLENOL) suppository 650 mg (has no administration in time range)  polyethylene glycol (MIRALAX / GLYCOLAX) packet 17 g (has no administration in time range)  ondansetron (ZOFRAN) tablet 4 mg (has no administration in time range)    Or  ondansetron (ZOFRAN) injection 4 mg (has no administration in time range)  albuterol (PROVENTIL) (2.5 MG/3ML) 0.083% nebulizer solution 2.5 mg (has no administration in time range)  ketorolac (TORADOL) 30 MG/ML injection 30 mg (30 mg Intravenous Given 06/30/18 1412)  ondansetron (ZOFRAN) injection 4 mg (4 mg Intravenous Given 06/30/18 1412)  0.9 %  sodium chloride infusion (1,000 mLs Intravenous Bolus 06/30/18 1413)    Mobility walks Low fall risk   Focused Assessments flank pain, euglycemic dka, tachypnea   R Recommendations: See Admitting Provider Note  Report given to:  Additional Notes:  Pt originally thought to have pyelo or a kidney stone, but pt is being admitted for euglycemic DKA. Pt takes jardiance, and has not been able to get insulin rx and this is what causes this. Pt is AOx4, independent, etc. We attempted to start a second IV to start fluids with insulin but was unsuccessful. Pt to be started at 3u/hr once fluid boluses are done.

## 2018-06-30 NOTE — Consult Note (Signed)
Name: Laurie Ortiz MRN: [id number] DOB: 04-26-1990    ADMISSION DATE:  06/30/2018 CONSULTATION DATE: 06/30/2018  REFERRING MD : Dr. Elpidio Anis   CHIEF COMPLAINT: N/V and Back Pain   BRIEF PATIENT DESCRIPTION:  28 yo female admitted with euglycemic DKA due to delay in medication refill requiring insulin gtt   SIGNIFICANT EVENTS/STUDIES:  03/18-Pt admitted to stepdown unit requiring insulin gtt   HISTORY OF PRESENT ILLNESS:   This is a 28 yo female with a PMH as listed below who presented to [facility] ER on 03/18 from [facility] with nausea, vomiting, bilateral back pain and suprapubic cramping concerning for possible pyelonephritis.  Per ER notes UA performed at [facility] revealed large amounts of ketones.  Upon arrival to the ER repeat UA negative for nitrite, bacteria, and RBC.  However, UA revealed glucose >500, moderate Hgb urine dipstick, and ketones 80.  Pt denies dysuria, urgency, frequency, or hematuria.  Lab results revealed Na+ 134, CO2 <7, lactic acid 0.9, wbc 15.4, and vbg pH 7.03/pCO2 24.  The pt does take Jardiance for Type I Diabetes Mellitus, however she has not taken her insulin in 1 week due to delay in refill.  Lab results ruled pt in for DKA, therefore insulin gtt initiated. She was subsequently admitted to the stepdown unit by hospitalist team for additional workup and treatment.   PAST MEDICAL HISTORY :   has a past medical history of Diabetes mellitus, type 2 (HCC), History of chlamydia (08/2015), History of Papanicolaou smear of cervix (04/22/13; 08/30/15), History of vaginitis, and Obesity (BMI 30.0-34.9).  has a past surgical history that includes Wisdom tooth extraction. Prior to Admission medications   Medication Sig Start Date End Date Taking? Authorizing Provider  JARDIANCE 25 MG TABS tablet Take 25 mg by mouth daily.  07/18/16  Yes [provider]  NOVOLOG MIX 70/30 FLEXPEN (70-30) 100 UNIT/ML FlexPen Inject 20-25 Units into the skin 2 (two) times daily with a  meal. 20 units every morning and 25 units every evening 07/18/16  Yes [provider]  simvastatin (ZOCOR) 20 MG tablet Take 20 mg by mouth at bedtime.  05/06/16  Yes [provider]  TOUJEO SOLOSTAR 300 UNIT/ML SOPN Inject 20-25 Units into the skin 2 (two) times daily. Takes 20 units every morning and 25 units every evening 07/18/16  Yes [provider]  Blood Glucose Monitoring Suppl (GLUCOCOM BLOOD GLUCOSE MONITOR) DEVI 1 each by XX route as directed. 09/19/13   [provider]  Continuous Blood Gluc Sensor (FREESTYLE LIBRE SENSOR SYSTEM) MISC  09/12/16   [provider]  Glucose Blood (COOL BLOOD GLUCOSE TEST STRIPS VI) Use to check blood sugars up to 6 times daily 01/22/15   [provider]  Insulin Pen Needle (FIFTY50 PEN NEEDLES) 31G X 8 MM MISC Use as directed four times daily 01/22/15   [provider]  levonorgestrel (MIRENA) 20 MCG/24HR IUD 1 each by Intrauterine route once.    [provider]  TRULICITY 0.75 MG/0.5ML SOPN Inject 0.75 mg into the skin once a week. Takes on monday 09/12/16   [provider]   Allergies  Allergen Reactions  . Penicillins     Doesn't take any medications ending in "cillin"  . Amoxicillin Hives and Rash    (rash 09/1996)    FAMILY HISTORY:  family history includes Breast cancer in her paternal grandmother; Dementia in her paternal grandfather; Heart disease in her maternal grandmother; Hypertension in her mother; Parkinson's disease in her  paternal grandfather; Prostate cancer in her paternal grandfather. SOCIAL HISTORY:  reports that she has never smoked. She has never used smokeless tobacco. She reports current alcohol use. She reports that she does not use drugs.  REVIEW OF SYSTEMS:   Constitutional: Negative for fever, chills, weight loss, malaise/fatigue and diaphoresis.  HENT: Negative for hearing loss, ear pain, nosebleeds, congestion, sore throat, neck pain, tinnitus and ear  discharge.   Eyes: Negative for blurred vision, double vision, photophobia, pain, discharge and redness.  Respiratory: Negative for cough, hemoptysis, sputum production, shortness of breath, wheezing and stridor.   Cardiovascular: Negative for chest pain, palpitations, orthopnea, claudication, leg swelling and PND.  Gastrointestinal: heartburn, nausea, vomiting, abdominal pain, diarrhea, constipation, blood in stool and melena.  Genitourinary: Negative for dysuria, urgency, frequency, hematuria and flank pain.  Musculoskeletal: myalgias, back pain, joint pain and falls.  Skin: Negative for itching and rash.  Neurological: Negative for dizziness, tingling, tremors, sensory change, speech change, focal weakness, seizures, loss of consciousness, weakness and headaches.  Endo/Heme/Allergies: Negative for environmental allergies and polydipsia. Does not bruise/bleed easily.  SUBJECTIVE:  c/o lower back pain   VITAL SIGNS: Temp:  [97.5 F (36.4 C)] 97.5 F (36.4 C) (03/18 1345) Pulse Rate:  [69-112] 104 (03/18 1630) Resp:  [18-22] 18 (03/18 1630) BP: (142-158)/(73-92) 142/91 (03/18 1630) SpO2:  [100 %] 100 % (03/18 1630) Weight:  [101.6 kg] 101.6 kg (03/18 1343)  PHYSICAL EXAMINATION: General: well developed, well nourished female, NAD  Neuro: alert and oriented, follows commands  HEENT: supple, no JVD  Cardiovascular: nsr, rrr, no R/G  Lungs: clear throughout, even, non labored  Abdomen: +BS x4, obese, soft, non tender, non distended  Musculoskeletal: normal bulk and tone, no edema  Skin: intact no rashes or lesions   Recent Labs  Lab 06/30/18 1347  NA 134*  K 4.1  CL 108  CO2 <7*  BUN 19  CREATININE 1.05*  GLUCOSE 162*   Recent Labs  Lab 06/30/18 1347  HGB 18.0*  HCT 55.7*  WBC 15.4*  PLT 390   Ct Renal Stone Study  Result Date: 06/30/2018 CLINICAL DATA:  Bilateral flank pain. EXAM: CT ABDOMEN AND PELVIS WITHOUT CONTRAST TECHNIQUE: Multidetector CT imaging of the  abdomen and pelvis was performed following the standard protocol without IV contrast. COMPARISON:  None. FINDINGS: Lower chest: Normal. Hepatobiliary: No focal liver abnormality is seen. No gallstones, gallbladder wall thickening, or biliary dilatation. Pancreas: Unremarkable. No pancreatic ductal dilatation or surrounding inflammatory changes. Spleen: Normal in size without focal abnormality. Adrenals/Urinary Tract: Adrenal glands are unremarkable. Kidneys are normal, without renal calculi, focal lesion, or hydronephrosis. Bladder is unremarkable. Stomach/Bowel: Stomach is within normal limits. Appendix appears normal. No evidence of bowel wall thickening, distention, or inflammatory changes. Vascular/Lymphatic: No significant vascular findings are present. No enlarged abdominal or pelvic lymph nodes. Reproductive: IUD in place. Uterus and ovaries are otherwise normal. Other: No abdominal wall hernia or abnormality. No abdominopelvic ascites. Musculoskeletal: The bones are normal other than a small ossified loose body which appears to be within the medial aspect of the left hip joint best seen on image 87 of series 5. IMPRESSION: Benign-appearing abdomen and pelvis. Small loose body in the left hip joint. Electronically Signed   By: Francene Boyers M.D.   On: 06/30/2018 15:09    ASSESSMENT / PLAN:  Diabetic ketoacidosis  Continue insulin gtt until anion gap closed and serum CO2 >20 BMP q4hrs and CBG q1hr while on insulin gtt  IV fluids per DKA  protocol  Continuous telemetry monitoring  Diabetes coordinator consulted appreciate input  Prn zofran for nausea/vomiting   Leukocytosis-no evidence of infection  Trend WBC and monitor fever curve  Will check PCT if elevated will start empiric abx  Follow urine culture   Acute pain  Prn morphine and toradol for pain management   Sonda Rumble, AGNP  Pulmonary/Critical Care Pager [phone] (please enter 7 digits) PCCM Consult Pager [phone]  (please enter 7 digits)

## 2018-06-30 NOTE — ED Triage Notes (Signed)
PT arrives with complaints of bilateral flank pain. [facility] went to [facility] prior to arrival whom sent pt to ED for further workup of possible pyelonephritis due to UA results.

## 2018-06-30 NOTE — ED Provider Notes (Signed)
[facility] Emergency Department Provider Note   ____________________________________________    I have reviewed the triage vital signs and the nursing notes.   HISTORY  Chief Complaint Flank Pain     HPI Laurie Ortiz is a 28 y.o. female with a history of diabetes who presents with complaints of bilateral back pain.  Sent from urgent care for possible pyelonephritis.  She denies dysuria but does report strong smelling urine, frequent urination.  She has aching in her back bilaterally/all over.  Denies fevers but has felt nauseated.  Reportedly urinalysis at urgent care was abnormal.  She has never had this before.  She does not take anything for this.  She also describes some cramping suprapubically  Past Medical History:  Diagnosis Date  . Diabetes mellitus, type 2 (HCC)   . History of chlamydia 08/2015  . History of Papanicolaou smear of cervix 04/22/13; 08/30/15   NEG CT/GC NEG; NEG CT-POS/GC/TR NEG  . History of vaginitis   . Obesity (BMI 30.0-34.9)     Patient Active Problem List   Diagnosis Date Noted  . Chlamydia 09/18/2016  . Trichimoniasis 09/18/2016  . Gonorrhea 07/29/2016  . Cyst of right Bartholin's gland 07/29/2016  . Exposure to STD 07/21/2016    Past Surgical History:  Procedure Laterality Date  . WISDOM TOOTH EXTRACTION      Prior to Admission medications   Medication Sig Start Date End Date Taking? Authorizing Provider  Blood Glucose Monitoring Suppl (GLUCOCOM BLOOD GLUCOSE MONITOR) DEVI 1 each by XX route as directed. 09/19/13   [provider]  Continuous Blood Gluc Sensor (FREESTYLE LIBRE SENSOR SYSTEM) MISC  09/12/16   [provider]  Glucose Blood (COOL BLOOD GLUCOSE TEST STRIPS VI) Use to check blood sugars up to 6 times daily 01/22/15   [provider]  Insulin Pen Needle (FIFTY50 PEN NEEDLES) 31G X 8 MM MISC Use as directed four times daily 01/22/15   [provider]  JARDIANCE  25 MG TABS tablet  07/18/16   [provider]  levonorgestrel (MIRENA) 20 MCG/24HR IUD 1 each by Intrauterine route once.    [provider]  NOVOLOG MIX 70/30 FLEXPEN (70-30) 100 UNIT/ML FlexPen  07/18/16   [provider]  simvastatin (ZOCOR) 20 MG tablet  05/06/16   [provider]  Nathen May SOLOSTAR 300 UNIT/ML SOPN  07/18/16   [provider]  TRULICITY 0.75 MG/0.5ML SOPN  09/12/16   [provider]     Allergies Penicillins and Amoxicillin  Family History  Problem Relation Age of Onset  . Hypertension Mother   . Heart disease Maternal Grandmother   . Breast cancer Paternal Grandmother        breast cancer in late 40s-died from breast cancer  . Prostate cancer Paternal Grandfather   . Dementia Paternal Grandfather   . Parkinson's disease Paternal Grandfather     Social History Social History   Tobacco Use  . Smoking status: Never Smoker  . Smokeless tobacco: Never Used  Substance Use Topics  . Alcohol use: Yes    Comment: OCC  . Drug use: No    Review of Systems  Constitutional: No fever/chills Eyes: No visual changes.  ENT: No sore throat. Cardiovascular: Denies chest pain. Respiratory: Denies shortness of breath. Gastrointestinal: As above Genitourinary: Negative for dysuria. Musculoskeletal: As above. Skin: Negative for rash. Neurological: Negative for headaches   ____________________________________________   PHYSICAL EXAM:  VITAL SIGNS: ED Triage Vitals  Enc Vitals  Group     BP 06/30/18 1342 (!) 158/92     Pulse Rate 06/30/18 1342 69     Resp 06/30/18 1342 18     Temp 06/30/18 1345 (!) 97.5 F (36.4 C)     Temp src --      SpO2 06/30/18 1345 100 %     Weight 06/30/18 1343 101.6 kg (224 lb)     Height 06/30/18 1343 1.702 m (5\' 7" )     Head Circumference --      Peak Flow --      Pain Score 06/30/18 1343 8     Pain Loc --      Pain Edu? --      Excl. in GC? --     Constitutional: Alert and  oriented.  Eyes: Conjunctivae are normal.   Nose: No congestion/rhinnorhea. Mouth/Throat: Mucous membranes are moist.    Cardiovascular: Normal rate, regular rhythm. Grossly normal heart sounds.  Good peripheral circulation. Respiratory: Normal respiratory effort.  No retractions. Lungs CTAB. Gastrointestinal: Mild suprapubic tenderness. No distention.  No significant CVA tenderness  Musculoskeletal: Back: No vertebral tenderness palpation.  Warm and well perfused Neurologic:  Normal speech and language. No gross focal neurologic deficits are appreciated.  Skin:  Skin is warm, dry and intact. No rash noted. Psychiatric: Mood and affect are normal. Speech and behavior are normal.  ____________________________________________   LABS (all labs ordered are listed, but only abnormal results are displayed)  Labs Reviewed  URINALYSIS, COMPLETE (UACMP) WITH MICROSCOPIC - Abnormal; Notable for the following components:      Result Value   Color, Urine YELLOW (*)    APPearance CLEAR (*)    Glucose, UA >=500 (*)    Hgb urine dipstick MODERATE (*)    Ketones, ur 80 (*)    Protein, ur 100 (*)    All other components within normal limits  BASIC METABOLIC PANEL - Abnormal; Notable for the following components:   Sodium 134 (*)    CO2 <7 (*)    Glucose, Bld 162 (*)    Creatinine, Ser 1.05 (*)    Calcium 8.6 (*)    All other components within normal limits  CBC - Abnormal; Notable for the following components:   WBC 15.4 (*)    RBC 5.94 (*)    Hemoglobin 18.0 (*)    HCT 55.7 (*)    All other components within normal limits  BLOOD GAS, VENOUS  POCT PREGNANCY, URINE   ____________________________________________  EKG  None ____________________________________________  RADIOLOGY  None ____________________________________________   PROCEDURES  Procedure(s) performed: No  Procedures   Critical Care performed: yes  CRITICAL CARE Performed by: Jene Every   Total  critical care time: 30 minutes  Critical care time was exclusive of separately billable procedures and treating other patients.  Critical care was necessary to treat or prevent imminent or life-threatening deterioration.  Critical care was time spent personally by me on the following activities: development of treatment plan with patient and/or surrogate as well as nursing, discussions with consultants, evaluation of patient's response to treatment, examination of patient, obtaining history from patient or surrogate, ordering and performing treatments and interventions, ordering and review of laboratory studies, ordering and review of radiographic studies, pulse oximetry and re-evaluation of patient's condition.  ____________________________________________   INITIAL IMPRESSION / ASSESSMENT AND PLAN / ED COURSE  Pertinent labs & imaging results that were available during my care of the patient were reviewed by me and considered in my medical  decision making (see chart for details).  Patient presents with nausea, tachycardia, mild back pain and foul-smelling urine certainly suspicious for pyelonephritis.  Back pain is not one-sided to suggest ureterolithiasis.  Pending labs, urinalysis, will treat with IV Toradol, IV Zofran, IV fluids  Patient's urinalysis is overall remarkable for small moderate hemoglobin but no leukocytes bacteria or white blood cells.  BMP is markedly abnormal with bicarb less than 7 although glucose is 162.  Upon further investigation patient is taking Jardiance and has not been taking her insulin for the last week because of a delay in having it prescribed hence it appears the patient has euglycemic diabetic ketoacidosis, this would explain her nausea, elevated white blood cell count and discomfort.  She is receiving IV fluids right now I discussed with hospitalist for admission    ____________________________________________   FINAL CLINICAL IMPRESSION(S) / ED DIAGNOSES   Final diagnoses:  Type 2 diabetes mellitus with ketoacidosis without coma, with long-term current use of insulin (HCC)        Note:  This document was prepared using Dragon voice recognition software and may include unintentional dictation errors.   Jene Every, MD 06/30/18 [phone]

## 2018-06-30 NOTE — ED Notes (Signed)
Admitting at bedside 

## 2018-06-30 NOTE — H&P (Signed)
[employer] - [place] at [facility]   PATIENT NAME: Laurie Ortiz    MR#:  [id number]  DATE OF BIRTH:  1990-10-12  DATE OF ADMISSION:  06/30/2018  PRIMARY CARE PHYSICIAN: Patient, No Pcp Per   REQUESTING/REFERRING PHYSICIAN:   CHIEF COMPLAINT:   Chief Complaint  Patient presents with  . Flank Pain    HISTORY OF PRESENT ILLNESS:  Laurie Ortiz  is a 28 y.o. female with a known history of insulin-dependent diabetes mellitus presents to the hospital complaining of nausea, vomiting, back pain.  Patient was seen in urgent care and sent to the emergency room.  She was unable to get a refill on her insulin since Thursday.  She finally got it and took a dose today.  But she felt extremely bad and presented to the emergency room.  Here her blood sugars are only 162 but found to have DKA with elevated ketones, bicarb less than 7 and anion gap 19.  Patient never had DKA in the past.  She is on Jardiance.  PAST MEDICAL HISTORY:   Past Medical History:  Diagnosis Date  . Diabetes mellitus, type 2 (HCC)   . History of chlamydia 08/2015  . History of Papanicolaou smear of cervix 04/22/13; 08/30/15   NEG CT/GC NEG; NEG CT-POS/GC/TR NEG  . History of vaginitis   . Obesity (BMI 30.0-34.9)     PAST SURGICAL HISTORY:   Past Surgical History:  Procedure Laterality Date  . WISDOM TOOTH EXTRACTION      SOCIAL HISTORY:   Social History   Tobacco Use  . Smoking status: Never Smoker  . Smokeless tobacco: Never Used  Substance Use Topics  . Alcohol use: Yes    Comment: OCC    FAMILY HISTORY:   Family History  Problem Relation Age of Onset  . Hypertension Mother   . Heart disease Maternal Grandmother   . Breast cancer Paternal Grandmother        breast cancer in late 40s-died from breast cancer  . Prostate cancer Paternal Grandfather   . Dementia Paternal Grandfather   . Parkinson's disease Paternal Grandfather     DRUG ALLERGIES:   Allergies  Allergen  Reactions  . Penicillins     Doesn't take any medications ending in "cillin"  . Amoxicillin Hives and Rash    (rash 09/1996)   REVIEW OF SYSTEMS:   Review of Systems  Constitutional: Positive for malaise/fatigue. Negative for chills and fever.  HENT: Negative for sore throat.   Eyes: Negative for blurred vision, double vision and pain.  Respiratory: Negative for cough, hemoptysis, shortness of breath and wheezing.   Cardiovascular: Negative for chest pain, palpitations, orthopnea and leg swelling.  Gastrointestinal: Positive for abdominal pain and nausea. Negative for constipation, diarrhea, heartburn and vomiting.  Genitourinary: Positive for flank pain. Negative for dysuria and hematuria.  Musculoskeletal: Negative for back pain and joint pain.  Skin: Negative for rash.  Neurological: Negative for sensory change, speech change, focal weakness and headaches.  Endo/Heme/Allergies: Does not bruise/bleed easily.  Psychiatric/Behavioral: Negative for depression. The patient is not nervous/anxious.    MEDICATIONS AT HOME:   Prior to Admission medications   Medication Sig Start Date End Date Taking? Authorizing Provider  JARDIANCE 25 MG TABS tablet Take 25 mg by mouth daily.  07/18/16  Yes [provider]  NOVOLOG MIX 70/30 FLEXPEN (70-30) 100 UNIT/ML FlexPen Inject 20-25 Units into the skin 2 (two) times daily with a meal. 20 units every morning and 25 units  every evening 07/18/16  Yes [provider]  simvastatin (ZOCOR) 20 MG tablet Take 20 mg by mouth at bedtime.  05/06/16  Yes [provider]  TOUJEO SOLOSTAR 300 UNIT/ML SOPN Inject 20-25 Units into the skin 2 (two) times daily. Takes 20 units every morning and 25 units every evening 07/18/16  Yes [provider]  Blood Glucose Monitoring Suppl (GLUCOCOM BLOOD GLUCOSE MONITOR) DEVI 1 each by XX route as directed. 09/19/13   [provider]  Continuous Blood Gluc Sensor (FREESTYLE LIBRE SENSOR SYSTEM)  MISC  09/12/16   [provider]  Glucose Blood (COOL BLOOD GLUCOSE TEST STRIPS VI) Use to check blood sugars up to 6 times daily 01/22/15   [provider]  Insulin Pen Needle (FIFTY50 PEN NEEDLES) 31G X 8 MM MISC Use as directed four times daily 01/22/15   [provider]  levonorgestrel (MIRENA) 20 MCG/24HR IUD 1 each by Intrauterine route once.    [provider]  TRULICITY 0.75 MG/0.5ML SOPN Inject 0.75 mg into the skin once a week. Takes on monday 09/12/16   [provider]     VITAL SIGNS:  Blood pressure (!) 147/84, pulse (!) 108, temperature (!) 97.5 F (36.4 C), resp. rate (!) 22, height 5\' 7"  (1.702 m), weight 101.6 kg, SpO2 100 %.  PHYSICAL EXAMINATION:  Physical Exam  GENERAL:  28 y.o.-year-old patient lying in the bed with no acute distress.  EYES: Pupils equal, round, reactive to light and accommodation. No scleral icterus. Extraocular muscles intact.  HEENT: Head atraumatic, normocephalic. Oropharynx and nasopharynx clear. No oropharyngeal erythema, dry oral mucosa  NECK:  Supple, no jugular venous distention. No thyroid enlargement, no tenderness.  LUNGS: Normal breath sounds bilaterally, no wheezing, rales, rhonchi. No use of accessory muscles of respiration.  CARDIOVASCULAR: S1, S2 normal. No murmurs, rubs, or gallops.  ABDOMEN: Soft, nontender, nondistended. Bowel sounds present. No organomegaly or mass.  EXTREMITIES: No pedal edema, cyanosis, or clubbing. + 2 pedal & radial pulses b/l.   NEUROLOGIC: Cranial nerves II through XII are intact. No focal Motor or sensory deficits appreciated b/l PSYCHIATRIC: The patient is alert and oriented x 3. Good affect.  SKIN: No obvious rash, lesion, or ulcer.   LABORATORY PANEL:   CBC Recent Labs  Lab 06/30/18 1347  WBC 15.4*  HGB 18.0*  HCT 55.7*  PLT 390    ------------------------------------------------------------------------------------------------------------------  Chemistries  Recent Labs  Lab 06/30/18 1347  NA 134*  K 4.1  CL 108  CO2 <7*  GLUCOSE 162*  BUN 19  CREATININE 1.05*  CALCIUM 8.6*   ------------------------------------------------------------------------------------------------------------------  Cardiac Enzymes No results for input(s): TROPONINI in the last 168 hours. ------------------------------------------------------------------------------------------------------------------  RADIOLOGY:  Ct Renal Stone Study  Result Date: 06/30/2018 CLINICAL DATA:  Bilateral flank pain. EXAM: CT ABDOMEN AND PELVIS WITHOUT CONTRAST TECHNIQUE: Multidetector CT imaging of the abdomen and pelvis was performed following the standard protocol without IV contrast. COMPARISON:  None. FINDINGS: Lower chest: Normal. Hepatobiliary: No focal liver abnormality is seen. No gallstones, gallbladder wall thickening, or biliary dilatation. Pancreas: Unremarkable. No pancreatic ductal dilatation or surrounding inflammatory changes. Spleen: Normal in size without focal abnormality. Adrenals/Urinary Tract: Adrenal glands are unremarkable. Kidneys are normal, without renal calculi, focal lesion, or hydronephrosis. Bladder is unremarkable. Stomach/Bowel: Stomach is within normal limits. Appendix appears normal. No evidence of bowel wall thickening, distention, or inflammatory changes. Vascular/Lymphatic: No significant vascular findings are present. No enlarged abdominal or pelvic lymph nodes. Reproductive: IUD in place. Uterus and ovaries  are otherwise normal. Other: No abdominal wall hernia or abnormality. No abdominopelvic ascites. Musculoskeletal: The bones are normal other than a small ossified loose body which appears to be within the medial aspect of the left hip joint best seen on image 87 of series 5. IMPRESSION: Benign-appearing abdomen and  pelvis. Small loose body in the left hip joint. Electronically Signed   By: Francene Boyers M.D.   On: 06/30/2018 15:09   IMPRESSION AND PLAN:   * DKA with normal blood sugars with patient being on Jardiance. She missed insulin at home. Admit to stepdown unit and start insulin drip with D5 normal saline.  Every hour Accu-Cheks.  Every 4 hours BMP. Consult intensivist  *Metabolic acidosis secondary to DKA  *Abdominal pain and back pain.  Urinalysis normal.  CT scan of the abdomen pelvis showed nothing acute.  Will check lipase level.  Likely due to DKA.  *DVT prophylaxis with Lovenox  All the records are reviewed and case discussed with ED provider. Management plans discussed with the patient, family and they are in agreement.  CODE STATUS: FULL CODE  TOTAL CRITICAL CARE TIME TAKING CARE OF THIS PATIENT: 50 minutes.   Molinda Bailiff Jaremy Nosal M.D on 06/30/2018 at 4:35 PM  Between 7am to 6pm - Pager - [phone]  After 6pm go to [email or web address] - password [email or web address]  [facility] [place] [facility]  Office  [phone]  CC: Primary care physician; Patient, No Pcp Per  Note: This dictation was prepared with Dragon dictation along with smaller phrase technology. Any transcriptional errors that result from this process are unintentional.

## 2018-06-30 NOTE — Progress Notes (Signed)
Peripherally Inserted Central Catheter/Midline Placement  The IV Nurse has discussed with the patient and/or persons authorized to consent for the patient, the purpose of this procedure and the potential benefits and risks involved with this procedure.  The benefits include less needle sticks, lab draws from the catheter, and the patient may be discharged home with the catheter. Risks include, but not limited to, infection, bleeding, blood clot (thrombus formation), and puncture of an artery; nerve damage and irregular heartbeat and possibility to perform a PICC exchange if needed/ordered by physician.  Alternatives to this procedure were also discussed.  Bard Power PICC patient education guide, fact sheet on infection prevention and patient information card has been provided to patient /or left at bedside.    PICC/Midline Placement Documentation  PICC Triple Lumen 06/30/18 PICC Right Basilic 43 cm 0 cm (Active)  Indication for Insertion or Continuance of Line Vasoactive infusions 06/30/2018  9:55 PM  Exposed Catheter (cm) 0 cm 06/30/2018  9:55 PM  Site Assessment Clean;Dry;Intact 06/30/2018  9:55 PM  Lumen #1 Status Blood return noted;Flushed;Saline locked 06/30/2018  9:55 PM  Lumen #2 Status Blood return noted;Flushed;Saline locked 06/30/2018  9:55 PM  Lumen #3 Status Blood return noted;Flushed;Saline locked 06/30/2018  9:55 PM  Dressing Type Transparent;Occlusive;Securing device 06/30/2018  9:55 PM  Dressing Status Clean;Dry;Intact;Antimicrobial disc in place 06/30/2018  9:55 PM  Line Adjustment (NICU/IV Team Only) No 06/30/2018  9:55 PM  Dressing Intervention New dressing 06/30/2018  9:55 PM  Dressing Change Due 07/07/18 06/30/2018  9:55 PM       Netta Corrigan L 06/30/2018, 10:19 PM

## 2018-06-30 NOTE — ED Notes (Signed)
Misty Stanley, RN to call this RN back with any questions about report handoff tool.

## 2018-06-30 NOTE — ED Notes (Signed)
This RN attempted a second IV without success to try to start insulin drip. Dr. Elpidio Anis advised that if two IVs, insulin drip can be started with D5 at 3u/hour with fluid boluses. This RN to tell Misty Stanley, RN getting report for ICU.

## 2018-06-30 NOTE — ED Notes (Signed)
Dr. Elpidio Anis notified of critical pH of 7.03 via message system.

## 2018-06-30 NOTE — ED Notes (Addendum)
Pt sent from [facility] to rule out pylonephritis. Pt is a type one diabetis with NV and back pain.  Urine tested at [facility] and resulted with large amounts of Ketone, Hgb and glucose. Pt has been afebrile with a CBG of 150.   4mg  Zofran given at [facility] for nasuea.

## 2018-06-30 NOTE — ED Notes (Signed)
Patient transported to CT 

## 2018-07-01 LAB — GLUCOSE, CAPILLARY
GLUCOSE-CAPILLARY: 99 mg/dL (ref 70–99)
GLUCOSE-CAPILLARY: 113 mg/dL — AB (ref 70–99)
Glucose-Capillary: 108 mg/dL — ABNORMAL HIGH (ref 70–99)
Glucose-Capillary: 113 mg/dL — ABNORMAL HIGH (ref 70–99)
Glucose-Capillary: 114 mg/dL — ABNORMAL HIGH (ref 70–99)
Glucose-Capillary: 118 mg/dL — ABNORMAL HIGH (ref 70–99)
Glucose-Capillary: 123 mg/dL — ABNORMAL HIGH (ref 70–99)
Glucose-Capillary: 125 mg/dL — ABNORMAL HIGH (ref 70–99)
Glucose-Capillary: 126 mg/dL — ABNORMAL HIGH (ref 70–99)
Glucose-Capillary: 136 mg/dL — ABNORMAL HIGH (ref 70–99)
Glucose-Capillary: 154 mg/dL — ABNORMAL HIGH (ref 70–99)
Glucose-Capillary: 160 mg/dL — ABNORMAL HIGH (ref 70–99)
Glucose-Capillary: 163 mg/dL — ABNORMAL HIGH (ref 70–99)
Glucose-Capillary: 163 mg/dL — ABNORMAL HIGH (ref 70–99)
Glucose-Capillary: 165 mg/dL — ABNORMAL HIGH (ref 70–99)
Glucose-Capillary: 172 mg/dL — ABNORMAL HIGH (ref 70–99)
Glucose-Capillary: 186 mg/dL — ABNORMAL HIGH (ref 70–99)
Glucose-Capillary: 196 mg/dL — ABNORMAL HIGH (ref 70–99)
Glucose-Capillary: 210 mg/dL — ABNORMAL HIGH (ref 70–99)
Glucose-Capillary: 226 mg/dL — ABNORMAL HIGH (ref 70–99)
Glucose-Capillary: 82 mg/dL (ref 70–99)
Glucose-Capillary: 84 mg/dL (ref 70–99)
Glucose-Capillary: 95 mg/dL (ref 70–99)

## 2018-07-01 LAB — COMPREHENSIVE METABOLIC PANEL
ALK PHOS: 43 U/L (ref 38–126)
ALT: 12 U/L (ref 0–44)
AST: 12 U/L — ABNORMAL LOW (ref 15–41)
Albumin: 3.2 g/dL — ABNORMAL LOW (ref 3.5–5.0)
Anion gap: 7 (ref 5–15)
BUN: 12 mg/dL (ref 6–20)
CALCIUM: 7.3 mg/dL — AB (ref 8.9–10.3)
CO2: 13 mmol/L — AB (ref 22–32)
Chloride: 115 mmol/L — ABNORMAL HIGH (ref 98–111)
Creatinine, Ser: 0.63 mg/dL (ref 0.44–1.00)
GFR calc non Af Amer: >60 mL/min (ref >60)
Glucose, Bld: 114 mg/dL — ABNORMAL HIGH (ref 70–99)
Potassium: 3.3 mmol/L — ABNORMAL LOW (ref 3.5–5.1)
Sodium: 135 mmol/L (ref 135–145)
Total Bilirubin: 1.1 mg/dL (ref 0.3–1.2)
Total Protein: 5.9 g/dL — ABNORMAL LOW (ref 6.5–8.1)

## 2018-07-01 LAB — BASIC METABOLIC PANEL
ANION GAP: 8 (ref 5–15)
ANION GAP: 6 (ref 5–15)
Anion gap: 6 (ref 5–15)
Anion gap: 7 (ref 5–15)
Anion gap: 7 (ref 5–15)
Anion gap: 7 (ref 5–15)
BUN: 13 mg/dL (ref 6–20)
BUN: 11 mg/dL (ref 6–20)
BUN: 12 mg/dL (ref 6–20)
BUN: 10 mg/dL (ref 6–20)
BUN: 10 mg/dL (ref 6–20)
BUN: 10 mg/dL (ref 6–20)
CHLORIDE: 116 mmol/L — AB (ref 98–111)
CO2: 13 mmol/L — AB (ref 22–32)
CO2: 14 mmol/L — ABNORMAL LOW (ref 22–32)
CO2: 14 mmol/L — ABNORMAL LOW (ref 22–32)
CO2: 16 mmol/L — ABNORMAL LOW (ref 22–32)
CO2: 16 mmol/L — ABNORMAL LOW (ref 22–32)
CO2: 20 mmol/L — ABNORMAL LOW (ref 22–32)
Calcium: 7.3 mg/dL — ABNORMAL LOW (ref 8.9–10.3)
Calcium: 7.4 mg/dL — ABNORMAL LOW (ref 8.9–10.3)
Calcium: 7.8 mg/dL — ABNORMAL LOW (ref 8.9–10.3)
Calcium: 7.8 mg/dL — ABNORMAL LOW (ref 8.9–10.3)
Calcium: 7.7 mg/dL — ABNORMAL LOW (ref 8.9–10.3)
Calcium: 7.7 mg/dL — ABNORMAL LOW (ref 8.9–10.3)
Chloride: 115 mmol/L — ABNORMAL HIGH (ref 98–111)
Chloride: 115 mmol/L — ABNORMAL HIGH (ref 98–111)
Chloride: 116 mmol/L — ABNORMAL HIGH (ref 98–111)
Chloride: 114 mmol/L — ABNORMAL HIGH (ref 98–111)
Chloride: 112 mmol/L — ABNORMAL HIGH (ref 98–111)
Creatinine, Ser: 0.61 mg/dL (ref 0.44–1.00)
Creatinine, Ser: 0.57 mg/dL (ref 0.44–1.00)
Creatinine, Ser: 0.6 mg/dL (ref 0.44–1.00)
Creatinine, Ser: 0.57 mg/dL (ref 0.44–1.00)
Creatinine, Ser: 0.48 mg/dL (ref 0.44–1.00)
Creatinine, Ser: 0.47 mg/dL (ref 0.44–1.00)
GFR calc Af Amer: >60 mL/min (ref >60)
GFR calc Af Amer: >60 mL/min (ref >60)
GFR calc Af Amer: >60 mL/min (ref >60)
GFR calc Af Amer: >60 mL/min (ref >60)
GFR calc Af Amer: >60 mL/min (ref >60)
GFR calc Af Amer: >60 mL/min (ref >60)
GFR calc non Af Amer: >60 mL/min (ref >60)
GFR calc non Af Amer: >60 mL/min (ref >60)
GFR calc non Af Amer: >60 mL/min (ref >60)
GFR calc non Af Amer: >60 mL/min (ref >60)
GFR calc non Af Amer: >60 mL/min (ref >60)
GFR calc non Af Amer: >60 mL/min (ref >60)
GLUCOSE: 218 mg/dL — AB (ref 70–99)
Glucose, Bld: 118 mg/dL — ABNORMAL HIGH (ref 70–99)
Glucose, Bld: 105 mg/dL — ABNORMAL HIGH (ref 70–99)
Glucose, Bld: 201 mg/dL — ABNORMAL HIGH (ref 70–99)
Glucose, Bld: 175 mg/dL — ABNORMAL HIGH (ref 70–99)
Glucose, Bld: 138 mg/dL — ABNORMAL HIGH (ref 70–99)
POTASSIUM: 3.2 mmol/L — AB (ref 3.5–5.1)
POTASSIUM: 3.2 mmol/L — AB (ref 3.5–5.1)
POTASSIUM: 3.5 mmol/L (ref 3.5–5.1)
Potassium: 3.4 mmol/L — ABNORMAL LOW (ref 3.5–5.1)
Potassium: 3.1 mmol/L — ABNORMAL LOW (ref 3.5–5.1)
Potassium: 3 mmol/L — ABNORMAL LOW (ref 3.5–5.1)
Sodium: 135 mmol/L (ref 135–145)
Sodium: 136 mmol/L (ref 135–145)
Sodium: 137 mmol/L (ref 135–145)
Sodium: 138 mmol/L (ref 135–145)
Sodium: 137 mmol/L (ref 135–145)
Sodium: 139 mmol/L (ref 135–145)

## 2018-07-01 LAB — PHOSPHORUS: Phosphorus: 1.1 mg/dL — ABNORMAL LOW (ref 2.5–4.6)

## 2018-07-01 LAB — CBC
HCT: 39.8 % (ref 36.0–46.0)
Hemoglobin: 13.3 g/dL (ref 12.0–15.0)
MCH: 30.3 pg (ref 26.0–34.0)
MCHC: 33.4 g/dL (ref 30.0–36.0)
MCV: 90.7 fL (ref 80.0–100.0)
Platelets: 242 10*3/uL (ref 150–400)
RBC: 4.39 MIL/uL (ref 3.87–5.11)
RDW: 12.8 % (ref 11.5–15.5)
WBC: 7.2 10*3/uL (ref 4.0–10.5)
nRBC: 0 % (ref 0.0–0.2)

## 2018-07-01 LAB — HEPATIC FUNCTION PANEL
ALK PHOS: 37 U/L — AB (ref 38–126)
ALT: 12 U/L (ref 0–44)
AST: 12 U/L — ABNORMAL LOW (ref 15–41)
Albumin: 3.1 g/dL — ABNORMAL LOW (ref 3.5–5.0)
BILIRUBIN TOTAL: 0.8 mg/dL (ref 0.3–1.2)
Bilirubin, Direct: 0.1 mg/dL (ref 0.0–0.2)
Indirect Bilirubin: 0.7 mg/dL (ref 0.3–0.9)
Total Protein: 6.2 g/dL — ABNORMAL LOW (ref 6.5–8.1)

## 2018-07-01 LAB — MAGNESIUM: Magnesium: 1.7 mg/dL (ref 1.7–2.4)

## 2018-07-01 LAB — MRSA PCR SCREENING: MRSA by PCR: NEGATIVE

## 2018-07-01 MED ORDER — INSULIN ASPART 100 UNIT/ML ~~LOC~~ SOLN
0.0000 [IU] | SUBCUTANEOUS | Status: DC
  Administered 2018-07-02: 3 [IU] via SUBCUTANEOUS
  Filled 2018-07-01: qty 1

## 2018-07-01 MED ORDER — SODIUM CHLORIDE 0.9 % IV SOLN: INTRAVENOUS | Status: DC

## 2018-07-01 MED ORDER — POTASSIUM PHOSPHATES 15 MMOLE/5ML IV SOLN
30.0000 mmol | Freq: Once | INTRAVENOUS | Status: AC
  Administered 2018-07-01: 30 mmol via INTRAVENOUS
  Filled 2018-07-01: qty 10

## 2018-07-01 MED ORDER — INSULIN GLARGINE 100 UNIT/ML ~~LOC~~ SOLN
25.0000 [IU] | Freq: Every day | SUBCUTANEOUS | Status: DC
  Administered 2018-07-01: 25 [IU] via SUBCUTANEOUS
  Filled 2018-07-01 (×2): qty 0.25

## 2018-07-01 MED ORDER — DEXTROSE-NACL 5-0.45 % IV SOLN: INTRAVENOUS | Status: DC

## 2018-07-01 MED ORDER — DEXTROSE-NACL 5-0.45 % IV SOLN
INTRAVENOUS | Status: DC
  Administered 2018-07-01: 12:00:00 via INTRAVENOUS

## 2018-07-01 MED ORDER — KCL-LACTATED RINGERS-D5W 20 MEQ/L IV SOLN
INTRAVENOUS | Status: DC
  Administered 2018-07-01: 06:00:00 via INTRAVENOUS
  Filled 2018-07-01 (×2): qty 1000

## 2018-07-01 MED ORDER — POTASSIUM CHLORIDE CRYS ER 20 MEQ PO TBCR
40.0000 meq | EXTENDED_RELEASE_TABLET | ORAL | Status: AC
  Administered 2018-07-01 (×2): 40 meq via ORAL
  Filled 2018-07-01 (×2): qty 2

## 2018-07-01 MED ORDER — INSULIN ASPART 100 UNIT/ML ~~LOC~~ SOLN: 0.0000 [IU] | Freq: Every day | SUBCUTANEOUS | Status: DC

## 2018-07-01 MED ORDER — INSULIN REGULAR(HUMAN) IN NACL 100-0.9 UT/100ML-% IV SOLN
INTRAVENOUS | Status: DC
  Administered 2018-07-01: 0.5 [IU]/h via INTRAVENOUS
  Filled 2018-07-01: qty 100

## 2018-07-01 MED ORDER — MAGNESIUM SULFATE 2 GM/50ML IV SOLN
2.0000 g | Freq: Once | INTRAVENOUS | Status: AC
  Administered 2018-07-01: 2 g via INTRAVENOUS
  Filled 2018-07-01: qty 50

## 2018-07-01 MED ORDER — DEXTROSE 10 % IV SOLN
INTRAVENOUS | Status: DC
  Administered 2018-07-01: 10:00:00 via INTRAVENOUS

## 2018-07-01 MED ORDER — INSULIN ASPART 100 UNIT/ML ~~LOC~~ SOLN: 0.0000 [IU] | Freq: Three times a day (TID) | SUBCUTANEOUS | Status: DC

## 2018-07-01 MED ORDER — POTASSIUM CHLORIDE CRYS ER 20 MEQ PO TBCR
40.0000 meq | EXTENDED_RELEASE_TABLET | Freq: Once | ORAL | Status: AC
  Administered 2018-07-01: 40 meq via ORAL
  Filled 2018-07-01: qty 2

## 2018-07-01 MED ORDER — KCL IN DEXTROSE-NACL 20-5-0.9 MEQ/L-%-% IV SOLN: INTRAVENOUS | Status: DC

## 2018-07-01 MED ORDER — INSULIN ASPART 100 UNIT/ML ~~LOC~~ SOLN: 5.0000 [IU] | Freq: Three times a day (TID) | SUBCUTANEOUS | Status: DC

## 2018-07-01 NOTE — Progress Notes (Signed)
   07/01/18 1300  Clinical Encounter Type  Visited With Family  Visit Type Initial;Spiritual support  Referral From Chaplain  Consult/Referral To Chaplain  Spiritual Encounters  Spiritual Needs Prayer;Emotional  Chaplain was rounding and stop to visit the patient. Patient was asleep and patient mother was visiting. Chaplain offered prayer for levels to balance out and encouraged the mother of patient. Mother of patient thanked the Chaplain for stopping by.

## 2018-07-01 NOTE — Progress Notes (Signed)
CRITICAL CARE NOTE        SUBJECTIVE FINDINGS & SIGNIFICANT EVENTS   Ambulatory, eating po diet, s/p discussion with diabetic coordinator, on insulin gtt still, albumin low anion gap inaccurate. Restarting insulin gtt  PAST MEDICAL HISTORY   Past Medical History:  Diagnosis Date  . Diabetes mellitus, type 2 (HCC)   . History of chlamydia 08/2015  . History of Papanicolaou smear of cervix 04/22/13; 08/30/15   NEG CT/GC NEG; NEG CT-POS/GC/TR NEG  . History of vaginitis   . Obesity (BMI 30.0-34.9)      SURGICAL HISTORY   Past Surgical History:  Procedure Laterality Date  . WISDOM TOOTH EXTRACTION       FAMILY HISTORY   Family History  Problem Relation Age of Onset  . Hypertension Mother   . Heart disease Maternal Grandmother   . Breast cancer Paternal Grandmother        breast cancer in late 40s-died from breast cancer  . Prostate cancer Paternal Grandfather   . Dementia Paternal Grandfather   . Parkinson's disease Paternal Grandfather      SOCIAL HISTORY   Social History   Tobacco Use  . Smoking status: Never Smoker  . Smokeless tobacco: Never Used  Substance Use Topics  . Alcohol use: Yes    Comment: OCC  . Drug use: No     MEDICATIONS   Current Medication:  Current Facility-Administered Medications:  .  acetaminophen (TYLENOL) tablet 650 mg, 650 mg, Oral, Q6H PRN **OR** acetaminophen (TYLENOL) suppository 650 mg, 650 mg, Rectal, Q6H PRN, Sudini, Srikar, MD .  albuterol (PROVENTIL) (2.5 MG/3ML) 0.083% nebulizer solution 2.5 mg, 2.5 mg, Nebulization, Q2H PRN, Sudini, Srikar, MD .  dextrose 5% in lactated ringers with KCl 20 mEq/L infusion, , Intravenous, Continuous, Harlon Ditty D, NP, Last Rate: 75 mL/hr at 07/01/18 0827 .  enoxaparin (LOVENOX) injection 40 mg, 40 mg,  Subcutaneous, Q24H, Bertram Savin, RPH .  insulin aspart (novoLOG) injection 0-15 Units, 0-15 Units, Subcutaneous, TID WC, Harlon Ditty D, NP .  insulin aspart (novoLOG) injection 0-5 Units, 0-5 Units, Subcutaneous, QHS, Harlon Ditty D, NP .  ketorolac (TORADOL) 15 MG/ML injection 15 mg, 15 mg, Intravenous, Q6H PRN, Eugenie Norrie, NP, 15 mg at 07/01/18 0739 .  morphine 2 MG/ML injection 1 mg, 1 mg, Intravenous, Q4H PRN, Eugenie Norrie, NP, 1 mg at 06/30/18 1947 .  ondansetron (ZOFRAN) tablet 4 mg, 4 mg, Oral, Q6H PRN **OR** ondansetron (ZOFRAN) injection 4 mg, 4 mg, Intravenous, Q6H PRN, Sudini, Srikar, MD .  polyethylene glycol (MIRALAX / GLYCOLAX) packet 17 g, 17 g, Oral, Daily PRN, Sudini, Srikar, MD .  potassium chloride SA (K-DUR,KLOR-CON) CR tablet 40 mEq, 40 mEq, Oral, Once, Bertram Savin, RPH .  sodium chloride flush (NS) 0.9 % injection 10-40 mL, 10-40 mL, Intracatheter, Q12H, Sudini, Srikar, MD, 30 mL at 06/30/18 2246 .  sodium chloride flush (NS) 0.9 % injection 10-40 mL, 10-40 mL, Intracatheter, PRN, Sudini, Srikar, MD .  sodium chloride flush (NS) 0.9 % injection 3 mL, 3 mL, Intravenous, Q12H, Sudini, Srikar, MD, 3 mL at 06/30/18 2246    ALLERGIES   Penicillins and Amoxicillin    REVIEW OF SYSTEMS    System ROS conducted and is negative except as per subjective findings  PHYSICAL EXAMINATION   Vitals:   07/01/18 0733 07/01/18 0800  BP:  118/70  Pulse:  99  Resp:  (!) 23  Temp: 36.8 C   SpO2:  96%  GENERAL: No acute distress HEAD: Normocephalic, atraumatic.  EYES: Pupils equal, round, reactive to light.  No scleral icterus.  MOUTH: Moist mucosal membrane. NECK: Supple. No thyromegaly. No nodules. No JVD.  PULMONARY: Clear to auscultation bilaterally CARDIOVASCULAR: S1 and S2. Regular rate and rhythm. No murmurs, rubs, or gallops.  GASTROINTESTINAL: Soft, nontender, non-distended. No masses. Positive bowel sounds. No hepatosplenomegaly.   MUSCULOSKELETAL: No swelling, clubbing, or edema.  NEUROLOGIC: Mild distress due to acute illness SKIN:intact,warm,dry   LABS AND IMAGING     LAB RESULTS: Recent Labs  Lab 07/01/18 0251 07/01/18 0426 07/01/18 0628  NA 135 135 136  K 3.4* 3.3* 3.1*  CL 116* 115* 115*  CO2 13* 13* 14*  BUN 13 12 11   CREATININE 0.61 0.63 0.57  GLUCOSE 118* 114* 105*   Recent Labs  Lab 06/30/18 1347 07/01/18 0426  HGB 18.0* 13.3  HCT 55.7* 39.8  WBC 15.4* 7.2  PLT 390 242     IMAGING RESULTS: Dg Chest Port 1 View  Result Date: 06/30/2018 CLINICAL DATA:  PICC line placement EXAM: PORTABLE CHEST 1 VIEW COMPARISON:  10/20/2013 FINDINGS: Right PICC line is been placed with the tip in the SVC. Heart and mediastinal contours are within normal limits. No focal opacities or effusions. No acute bony abnormality. IMPRESSION: Right PICC line tip in the SVC.  No active cardiopulmonary disease. Electronically Signed   By: Charlett Nose M.D.   On: 06/30/2018 22:21   Ct Renal Stone Study  Result Date: 06/30/2018 CLINICAL DATA:  Bilateral flank pain. EXAM: CT ABDOMEN AND PELVIS WITHOUT CONTRAST TECHNIQUE: Multidetector CT imaging of the abdomen and pelvis was performed following the standard protocol without IV contrast. COMPARISON:  None. FINDINGS: Lower chest: Normal. Hepatobiliary: No focal liver abnormality is seen. No gallstones, gallbladder wall thickening, or biliary dilatation. Pancreas: Unremarkable. No pancreatic ductal dilatation or surrounding inflammatory changes. Spleen: Normal in size without focal abnormality. Adrenals/Urinary Tract: Adrenal glands are unremarkable. Kidneys are normal, without renal calculi, focal lesion, or hydronephrosis. Bladder is unremarkable. Stomach/Bowel: Stomach is within normal limits. Appendix appears normal. No evidence of bowel wall thickening, distention, or inflammatory changes. Vascular/Lymphatic: No significant vascular findings are present. No enlarged abdominal  or pelvic lymph nodes. Reproductive: IUD in place. Uterus and ovaries are otherwise normal. Other: No abdominal wall hernia or abnormality. No abdominopelvic ascites. Musculoskeletal: The bones are normal other than a small ossified loose body which appears to be within the medial aspect of the left hip joint best seen on image 87 of series 5. IMPRESSION: Benign-appearing abdomen and pelvis. Small loose body in the left hip joint. Electronically Signed   By: Francene Boyers M.D.   On: 06/30/2018 15:09   [place] Ekg Site Rite  Result Date: 06/30/2018 If Site Rite image not attached, placement could not be confirmed due to current cardiac rhythm.     ASSESSMENT AND PLAN    -Multidisciplinary rounds held today  Diabetic ketoacidosis  Continue insulin gtt until anion gap closed -jardiance known to cause acidosis so Hco3 is lower then goal today partially due to this medication- will also order hepatic function for albumin correction of AG -intermittent hypoglycemia overnight - continue q1h fingerstick BMP q4hrs and CBG q1hr  -Diabetic coordinator reviewed current treatment recommendation to restart insulin drip   Leukocytosis-no evidence of infection  Trend WBC and monitor fever curve  PCT - <.1 - suggestion of low risk infectious process Follow urine culture   Acute pain  Prn morphine and toradol for pain  management - toradol this am - lumbago   GI/Nutrition GI PROPHYLAXIS as indicated DIET-->TF's as tolerated Constipation protocol as indicated  ENDO - ICU hypoglycemic\Hyperglycemia protocol -check FSBS per protocol   ELECTROLYTES -follow labs as needed -replace as needed -pharmacy consultation   DVT/GI PRX ordered -SCDs  TRANSFUSIONS AS NEEDED MONITOR FSBS ASSESS the need for LABS as needed   Critical care provider statement:    Critical care time (minutes):  35   Critical care time was exclusive of:  Separately billable procedures and treating other patients    Critical care was necessary to treat or prevent imminent or life-threatening deterioration of the following conditions:  DKA, pain syndrome, critical care monitoring with q1h fingerstick blood glucose monitoring.    Critical care was time spent personally by me on the following activities:  Development of treatment plan with patient or surrogate, discussions with consultants, evaluation of patient's response to treatment, examination of patient, obtaining history from patient or surrogate, ordering and performing treatments and interventions, ordering and review of laboratory studies and re-evaluation of patient's condition.  I assumed direction of critical care for this patient from another provider in my specialty: no    This document was prepared using Dragon voice recognition software and may include unintentional dictation errors.    Vida Rigger, M.D.  Division of Pulmonary & Critical Care Medicine  [facility]

## 2018-07-01 NOTE — Progress Notes (Signed)
Inpatient Diabetes Program Recommendations  AACE/ADA: New Consensus Statement on Inpatient Glycemic Control (2015)  Target Ranges:  Prepandial:   less than 140 mg/dL      Peak postprandial:   less than 180 mg/dL (1-2 hours)      Critically ill patients:  140 - 180 mg/dL   Lab Results  Component Value Date   GLUCAP 163 (H) 07/01/2018  Diabetes history: DM 1 (per Dr. Solum-dx-2011) Outpatient Diabetes medications:  Jardiance 25 mg daily 70/30 20 units q AM and 25 units q PM Toujeo 20 units in AM and 25 units in PM Trulicity 0.75 weekly (takes on Mondays) Current orders for Inpatient glycemic control:  IV insulin/DKA order set  Recommendations:   Spoke at length with patient and mother.  Cost has been an issue for patient stating that her medications are about 500$ per month.  She states that her MD did not call in her Rx. for insulin until yesterday so she went 7 days without her insulin. She continued to take her Jardiance and Trulicity.  Of note, C-peptide=0.8 on 12/13/13 (very low).   She is currently purchasing 4 different diabetes medications per month.   Discussed options for insulin? She states that she has been on Jardiance daily for 2 years. She reports "many" UTI's over the past few months.  She also states that she has been taking Trulicity weekly.  Recommend d/c of both Trulicity and Jardiance at d/c due cost and increased risk of UTI/DKA with SGLT-2 (Jardiance).  We discussed at length DKA and that Jardiance can increase risk.  We also discussed that her pancreas is not making enough insulin to keep her body fed/glucose in her cells.  Therefore her body breaks down muscle and fat which leads to acidosis.    Once acidosis cleared, recommend Lantus 25 units (0.25 units/kg)- 2 hours prior to d/c of insulin drip.  Further recommend Novolog sensitive correction q 4 hours plus Novolog 5 units tid with meals.  She will likely need adjustment in insulin doses based on CBG's.   Upon  D/C consider simplifying insulin/DM medications. Consider Lantus, Novolog and Metformin at d/c? She also would benefit from [facility] CGM to assist with monitoring.  Patient is calling insurance to find out preferred insulins and the cost of CGM per month.  I am also researching 99$ program through insulin companies that may help her with cost.    Patient seems motivated and wants new MD stating that her MD "fired her".  Her last A1C was 9.7%.  She plans to call [facility] endocrinology for appointment.  May need referral.  Patient will call to make appointment and find out if she needs referral.  Will follow.   Thanks  Beryl Meager, RN, BC-ADM Inpatient Diabetes Coordinator Pager [phone] (8a-5p)

## 2018-07-01 NOTE — Progress Notes (Signed)
Pharmacy Electrolyte Monitoring Consult:  Pharmacy consulted to assist in monitoring and replacing electrolytes in this 28 y.o. female admitted on 06/30/2018 with Flank Pain   Labs:  Sodium (mmol/L)  Date Value  07/01/2018 138  10/20/2013 139   Potassium (mmol/L)  Date Value  07/01/2018 3.2 (L)  10/20/2013 3.8   Magnesium (mg/dL)  Date Value  92/42/6834 1.7   Phosphorus (mg/dL)  Date Value  19/62/2297 1.1 (L)   Calcium (mg/dL)  Date Value  98/92/1194 7.8 (L)   Calcium, Total (mg/dL)  Date Value  17/40/8144 8.9   Albumin (g/dL)  Date Value  81/85/6314 3.2 (L)  07/01/2018 3.1 (L)   Corrected calcium: 8.5  Assessment/Plan: Patient received potassium PO x 1, potassium phosphate IV x 1, and magnesium 2g IV x 1.   Will order additional potassium PO Q4hr x 2 doses.   Pharmacy will continue to monitor and adjust per consult.   Tashon Capp L 07/01/2018 4:43 PM

## 2018-07-01 NOTE — Progress Notes (Signed)
[facility] [place] [facility]   PATIENT NAME: Laurie Ortiz    MR#:  [id number]  DATE OF BIRTH:  [birth year]  SUBJECTIVE:   Patient ran out of medications a week ago and now presented with DKA  Abdominal pain resolved  REVIEW OF SYSTEMS:    Review of Systems  Constitutional: Negative for fever, chills weight loss HENT: Negative for ear pain, nosebleeds, congestion, facial swelling, rhinorrhea, neck pain, neck stiffness and ear discharge.   Respiratory: Negative for cough, shortness of breath, wheezing  Cardiovascular: Negative for chest pain, palpitations and leg swelling.  Gastrointestinal: Negative for heartburn, abdominal pain, vomiting, diarrhea or consitpation Genitourinary: Negative for dysuria, urgency, frequency, hematuria Musculoskeletal: Negative for back pain or joint pain Neurological: Negative for dizziness, seizures, syncope, focal weakness,  numbness and headaches.  Hematological: Does not bruise/bleed easily.  Psychiatric/Behavioral: Negative for hallucinations, confusion, dysphoric mood    Tolerating Diet: npo      DRUG ALLERGIES:   Allergies  Allergen Reactions  . Penicillins     Doesn't take any medications ending in "cillin"  . Amoxicillin Hives and Rash    (rash 09/1996)    VITALS:  Blood pressure 118/70, pulse 99, temperature 98.3 F (36.8 C), temperature source Oral, resp. rate (!) 23, height 5\' 7"  (1.702 m), weight 101.7 kg, SpO2 96 %.  PHYSICAL EXAMINATION:  Constitutional: Appears well-developed and well-nourished. No distress. HENT: Normocephalic. Marland Kitchen Oropharynx is clear and moist.  Eyes: Conjunctivae and EOM are normal. PERRLA, no scleral icterus.  Neck: Normal ROM. Neck supple. No JVD. No tracheal deviation. CVS: RRR, S1/S2 +, no murmurs, no gallops, no carotid bruit.  Pulmonary: Effort and breath sounds normal, no stridor, rhonchi, wheezes, rales.  Abdominal: Soft. BS +,  no distension, tenderness, rebound or  guarding.  Musculoskeletal: Normal range of motion. No edema and no tenderness.  Neuro: Alert. CN 2-12 grossly intact. No focal deficits. Skin: Skin is warm and dry. No rash noted. Psychiatric: Normal mood and affect.      LABORATORY PANEL:   CBC Recent Labs  Lab 07/01/18 0426  WBC 7.2  HGB 13.3  HCT 39.8  PLT 242   ------------------------------------------------------------------------------------------------------------------  Chemistries  Recent Labs  Lab 07/01/18 0426 07/01/18 0628 07/01/18 0944  NA 135 136 137  K 3.3* 3.1* 3.2*  CL 115* 115* 115*  CO2 13* 14* 14*  GLUCOSE 114* 105* 218*  BUN 12 11 12   CREATININE 0.63 0.57 0.60  CALCIUM 7.3* 7.4* 7.8*  MG  --  1.7  --   AST 12*  12*  --   --   ALT 12  12  --   --   ALKPHOS 37*  43  --   --   BILITOT 0.8  1.1  --   --    ------------------------------------------------------------------------------------------------------------------  Cardiac Enzymes No results for input(s): TROPONINI in the last 168 hours. ------------------------------------------------------------------------------------------------------------------  RADIOLOGY:  Dg Chest Port 1 View  Result Date: 06/30/2018 CLINICAL DATA:  PICC line placement EXAM: PORTABLE CHEST 1 VIEW COMPARISON:  10/20/2013 FINDINGS: Right PICC line is been placed with the tip in the SVC. Heart and mediastinal contours are within normal limits. No focal opacities or effusions. No acute bony abnormality. IMPRESSION: Right PICC line tip in the SVC.  No active cardiopulmonary disease. Electronically Signed   By: Charlett Nose M.D.   On: 06/30/2018 22:21   Ct Renal Stone Study  Result Date: 06/30/2018 CLINICAL DATA:  Bilateral flank pain. EXAM: CT ABDOMEN AND  PELVIS WITHOUT CONTRAST TECHNIQUE: Multidetector CT imaging of the abdomen and pelvis was performed following the standard protocol without IV contrast. COMPARISON:  None. FINDINGS: Lower chest: Normal.  Hepatobiliary: No focal liver abnormality is seen. No gallstones, gallbladder wall thickening, or biliary dilatation. Pancreas: Unremarkable. No pancreatic ductal dilatation or surrounding inflammatory changes. Spleen: Normal in size without focal abnormality. Adrenals/Urinary Tract: Adrenal glands are unremarkable. Kidneys are normal, without renal calculi, focal lesion, or hydronephrosis. Bladder is unremarkable. Stomach/Bowel: Stomach is within normal limits. Appendix appears normal. No evidence of bowel wall thickening, distention, or inflammatory changes. Vascular/Lymphatic: No significant vascular findings are present. No enlarged abdominal or pelvic lymph nodes. Reproductive: IUD in place. Uterus and ovaries are otherwise normal. Other: No abdominal wall hernia or abnormality. No abdominopelvic ascites. Musculoskeletal: The bones are normal other than a small ossified loose body which appears to be within the medial aspect of the left hip joint best seen on image 87 of series 5. IMPRESSION: Benign-appearing abdomen and pelvis. Small loose body in the left hip joint. Electronically Signed   By: Francene Boyers M.D.   On: 06/30/2018 15:09   [place] Ekg Site Rite  Result Date: 06/30/2018 If Site Rite image not attached, placement could not be confirmed due to current cardiac rhythm.    ASSESSMENT AND PLAN:   28 year old female with type 1 diabetes on insulin and oral medications who presents with abdominal pain and found to have DKA.  1.  DKA: Patient needs to continue on DKA protocol with glucose stabilizer despite anion gap normalizing.  Her bicarb is still low which is indicative of DKA. We will recheck beta hydroxybutyrate.  Appreciate diabetes nurse consult  2.  Abdominal pain: CT the abdomen was essentially unremarkable.  Abdominal pain is due to DKA which is resolved.  3.  Hypokalemia from DKA: Replace PRN Magnesium level this a.m. is 1.7  Discussed with intensivist Management plans  discussed with the patient and she is in agreement.  CODE STATUS:  full  TOTAL TIME TAKING CARE OF THIS PATIENT: 30 minutes.     POSSIBLE D/C tomorrow, DEPENDING ON CLINICAL CONDITION.   Adrian Saran M.D on 07/01/2018 at 11:41 AM  Between 7am to 6pm - Pager - [phone] After 6pm go to [email or web address] - password [email or web address]  [facility]  Office  [phone]  CC: Primary care physician; Patient, No Pcp Per  Note: This dictation was prepared with Dragon dictation along with smaller phrase technology. Any transcriptional errors that result from this process are unintentional.

## 2018-07-01 NOTE — Progress Notes (Signed)
Inpatient Diabetes Program Recommendations  AACE/ADA: New Consensus Statement on Inpatient Glycemic Control (2015)  Target Ranges:  Prepandial:   less than 140 mg/dL      Peak postprandial:   less than 180 mg/dL (1-2 hours)      Critically ill patients:  140 - 180 mg/dL   Lab Results  Component Value Date   GLUCAP 123 (H) 07/01/2018    Review of Glycemic Control Results for Laurie Ortiz, Laurie Ortiz (MRN [id number]) as of 07/01/2018 08:14  Ref. Range 07/01/2018 05:33 07/01/2018 06:25 07/01/2018 07:32  Glucose-Capillary Latest Ref Range: 70 - 99 mg/dL 967 (H) 84 82  Results for [name], [name] (MRN [id number]) as of 07/01/2018 08:14  Ref. Range 07/01/2018 06:28  Sodium Latest Ref Range: 135 - 145 mmol/L 136  Potassium Latest Ref Range: 3.5 - 5.1 mmol/L 3.1 (L)  Chloride Latest Ref Range: 98 - 111 mmol/L 115 (H)  CO2 Latest Ref Range: 22 - 32 mmol/L 14 (L)  Glucose Latest Ref Range: 70 - 99 mg/dL 599 (H)  BUN Latest Ref Range: 6 - 20 mg/dL 11  Creatinine Latest Ref Range: 0.44 - 1.00 mg/dL 3.57  Calcium Latest Ref Range: 8.9 - 10.3 mg/dL 7.4 (L)  Anion gap Latest Ref Range: 5 - 15  7   Diabetes history: DM 1 (per Dr. Solum-dx-2011) Outpatient Diabetes medications:  Jardiance 25 mg daily 70/30 20 units q AM and 25 units q PM Toujeo? Trulicity 0.75 weekly (takes on Mondays) Current orders for Inpatient glycemic control:  Novolog moderate tid with meals and HS  Inpatient Diabetes Program Recommendations:    Note admit with DKA however blood sugars are normal.  Per chart review patient has Type 1 DM (has seen Dr. Tedd Sias in 2017).  Patient is also on Trulicity weekly and SGL-T 2 (Jardiance). Insulin drip needs were 0 with the Glucostabilizer and blood sugars less than 100 mg/dL.  Note that diet has been started.   May consider checking Beta-hyboxyuterate to ensure that ketones are cleared? Also may need to restart insulin drip with higher rate of Dextrose to ensure that insulin is  infusing at all times to clear ketones.  Thanks, Beryl Meager, RN, BC-ADM Inpatient Diabetes Coordinator Pager [phone] (8a-5p)

## 2018-07-02 LAB — URINE CULTURE: Culture: NO GROWTH

## 2018-07-02 LAB — BASIC METABOLIC PANEL
Anion gap: 5 (ref 5–15)
BUN: 9 mg/dL (ref 6–20)
CHLORIDE: 113 mmol/L — AB (ref 98–111)
CO2: 21 mmol/L — ABNORMAL LOW (ref 22–32)
Calcium: 7.8 mg/dL — ABNORMAL LOW (ref 8.9–10.3)
Creatinine, Ser: 0.35 mg/dL — ABNORMAL LOW (ref 0.44–1.00)
GFR calc Af Amer: >60 mL/min (ref >60)
GFR calc non Af Amer: >60 mL/min (ref >60)
Glucose, Bld: 82 mg/dL (ref 70–99)
Potassium: 3.2 mmol/L — ABNORMAL LOW (ref 3.5–5.1)
Sodium: 139 mmol/L (ref 135–145)

## 2018-07-02 LAB — GLUCOSE, CAPILLARY
GLUCOSE-CAPILLARY: 90 mg/dL (ref 70–99)
Glucose-Capillary: 67 mg/dL — ABNORMAL LOW (ref 70–99)
Glucose-Capillary: 80 mg/dL (ref 70–99)
Glucose-Capillary: 85 mg/dL (ref 70–99)
Glucose-Capillary: 123 mg/dL — ABNORMAL HIGH (ref 70–99)
Glucose-Capillary: 221 mg/dL — ABNORMAL HIGH (ref 70–99)

## 2018-07-02 LAB — PHOSPHORUS: Phosphorus: 1.7 mg/dL — ABNORMAL LOW (ref 2.5–4.6)

## 2018-07-02 LAB — HEMOGLOBIN A1C
Hgb A1c MFr Bld: 9.9 % — ABNORMAL HIGH (ref 4.8–5.6)
Mean Plasma Glucose: 237.43 mg/dL

## 2018-07-02 LAB — HIV ANTIBODY (ROUTINE TESTING W REFLEX): HIV Screen 4th Generation wRfx: NONREACTIVE

## 2018-07-02 LAB — MAGNESIUM: Magnesium: 2.2 mg/dL (ref 1.7–2.4)

## 2018-07-02 MED ORDER — INSULIN LISPRO (1 UNIT DIAL) 100 UNIT/ML (KWIKPEN): 10.0000 [IU] | PEN_INJECTOR | Freq: Three times a day (TID) | SUBCUTANEOUS | 11 refills | Status: DC

## 2018-07-02 MED ORDER — POTASSIUM CHLORIDE CRYS ER 20 MEQ PO TBCR
40.0000 meq | EXTENDED_RELEASE_TABLET | Freq: Once | ORAL | Status: AC
  Administered 2018-07-02: 40 meq via ORAL
  Filled 2018-07-02: qty 2

## 2018-07-02 MED ORDER — INSULIN ASPART 100 UNIT/ML ~~LOC~~ SOLN: 3.0000 [IU] | Freq: Three times a day (TID) | SUBCUTANEOUS | Status: DC

## 2018-07-02 MED ORDER — INSULIN LISPRO (1 UNIT DIAL) 100 UNIT/ML (KWIKPEN): PEN_INJECTOR | SUBCUTANEOUS | 11 refills | Status: DC

## 2018-07-02 MED ORDER — INSULIN LISPRO 100 UNIT/ML ~~LOC~~ SOLN: SUBCUTANEOUS | 3 refills | Status: DC

## 2018-07-02 MED ORDER — MAGNESIUM SULFATE 2 GM/50ML IV SOLN: 2.0000 g | Freq: Once | INTRAVENOUS | Status: DC

## 2018-07-02 MED ORDER — SODIUM PHOSPHATES 45 MMOLE/15ML IV SOLN
20.0000 mmol | Freq: Once | INTRAVENOUS | Status: AC
  Administered 2018-07-02: 20 mmol via INTRAVENOUS
  Filled 2018-07-02: qty 6.67

## 2018-07-02 MED ORDER — TOUJEO SOLOSTAR 300 UNIT/ML ~~LOC~~ SOPN: 20.0000 [IU] | PEN_INJECTOR | Freq: Every day | SUBCUTANEOUS | 0 refills | Status: DC

## 2018-07-02 MED ORDER — INSULIN GLARGINE 100 UNIT/ML ~~LOC~~ SOLN
20.0000 [IU] | Freq: Every day | SUBCUTANEOUS | Status: DC
  Filled 2018-07-02: qty 0.2

## 2018-07-02 NOTE — Consult Note (Signed)
PHARMACY CONSULT NOTE - FOLLOW UP  Pharmacy Consult for Electrolyte Monitoring and Replacement   Recent Labs: Potassium (mmol/L)  Date Value  07/02/2018 3.2 (L)  10/20/2013 3.8   Magnesium (mg/dL)  Date Value  76/81/1572 2.2   Calcium (mg/dL)  Date Value  62/06/5595 7.8 (L)   Calcium, Total (mg/dL)  Date Value  41/63/8453 8.9   Albumin (g/dL)  Date Value  64/68/0321 3.2 (L)  07/01/2018 3.1 (L)   Phosphorus (mg/dL)  Date Value  22/48/2500 1.7 (L)   Sodium (mmol/L)  Date Value  07/02/2018 139  10/20/2013 139     Assessment: Pharmacy has been consulted to monitor and replenish electrolytes in this patient with admission for DKA.  Goal of Therapy:  Electrolytes wnl's  Plan:  K= 3.2 > Will give x 1 Phos = 1.7 > Patient given Sodium Phosphate IV 20 mmol Mg = 2.2 > No replenishment warranted  Will recheck K/Phos with am labs per protocol  Albina Billet, PharmD, BCPS Clinical Pharmacist 07/02/2018 10:49 AM

## 2018-07-02 NOTE — Plan of Care (Addendum)
Nutrition Education Note  RD consulted for nutrition education regarding carbohydrate counting for type 1 diabetes management.  RD reviewed Diabetes Coordinator notes.  Diabetes Coordinator recommendations for home carbohydrate coverage for meals and snacks: Humalog 1 unit for every 10 grams of carbohydrate (with each meal and snack)  Spoke with pt and mother at bedside. Pt and mother very receptive to and appreciative of education. Pt reports that when she was first diagnosed with T1DM, she worked closely with her MD to develop and insulin regimen and learned about carbohydrate counting at that time. Pt reports that her new MD did not discuss that with her and it "all went out the window."  RD provided "Carbohydrate Counting for People with Diabetes" handout from the Academy of Nutrition and Dietetics. Discussed different food groups and their effects on blood sugar, emphasizing carbohydrate-containing foods. Provided list of carbohydrates. Encouraged pt to download an app to her cell phone that will help her in counting grams of carbohydrates for meals and snacks. Reviewed the Nutrition Facts Label and discussed importance of looking at serving size first then looking at grams of carbohydrates per serving.  Practiced creating sample meals, calculating grams of carbohydrates, then determining insulin coverage with pt and pt's mother.  Provided examples of ways to balance meals/snacks and encouraged intake of high-fiber, whole grain complex carbohydrates. Teach back method used.  Expect good compliance.  Body mass index is 34.98 kg/m. Pt meets criteria for obesity class I based on current BMI.  Current diet order is Carb Modified, patient is consuming approximately 100% of meals at this time. Labs and medications reviewed. No further nutrition interventions warranted at this time. RD contact information provided. If additional nutrition issues arise, please re-consult RD.   Laurie Reading, MS, RD, LDN Inpatient Clinical Dietitian Pager: [phone] Weekend/After Hours: [phone]

## 2018-07-02 NOTE — Progress Notes (Addendum)
Inpatient Diabetes Program Recommendations  AACE/ADA: New Consensus Statement on Inpatient Glycemic Control (2015)  Target Ranges:  Prepandial:   less than 140 mg/dL      Peak postprandial:   less than 180 mg/dL (1-2 hours)      Critically ill patients:  140 - 180 mg/dL   Lab Results  Component Value Date   GLUCAP 85 07/02/2018    Review of Glycemic Control Results for Laurie Ortiz, Laurie Ortiz (MRN [id number]) as of 07/02/2018 10:05  Ref. Range 07/01/2018 22:57 07/01/2018 23:57 07/02/2018 03:43 07/02/2018 04:00 07/02/2018 07:47  Glucose-Capillary Latest Ref Range: 70 - 99 mg/dL 469 (H) 90 67 (L) 80 85  Diabetes history:DM 1 (per Dr. Solum-dx-2011) Outpatient Diabetes medications: Jardiance 25 mg daily 70/30 20 units q AM and 25 units q PM Toujeo 20 units in AM and 25 units in PM Trulicity 0.75 weekly (takes on Mondays) Current orders for Inpatient glycemic control: Lantus 25 units q HS Inpatient Diabetes Program Recommendations:    Recommendations for discharge medications:  -Toujeo 22 units every night at 10 pm  May increase dose by 1 unit every 3 days if fasting blood sugar (first thing in the morning before eating) is greater than 120 mg/dL -Humalog 1 unit for every 10 grams of carbohydrate (with each meal and snack) -Meal time Humalog correction scale before meals (to be added to meal coverage): 141-180 mg/dL- 1 unit 507-225 mg/dL- 2 units 750-518 mg/dL- 3 units 335-825 mg/dL- 4 units 189-842 mg/dL- 5 units 103-128 mg/dL- 6 units 118-867 mg/dL- 7 units -Bedtime Humalog correction scale:  181-240 mg/dL-1 unit  737-366 mg/dL-2 units  815-947 mg/dL-3 units  076-151 mg/dL-4 units  Check blood sugars 4-5 times a day.  Check first thing in the morning, before each meal and at bedtime.  Also consider restarting Freestyle Libre (Continuous Glucose monitor) for blood sugar monitoring.    MD, patient will need prescriptions for Toujeo insulin pens, Humalog kwik pen [phone]), and  Freestyle Josephine Igo- she will need 2 permonth [phone]).  Reviewed with patient in detail.  Will also give her printed copy of instructions.  Requested dietician consult prior to d/c for review in carbohydrate counting.  Patient wants to see [facility] endocrinology.  She states that she called on 3/19 for appointment and was told that she needs a physician referral.  Will discuss with MD.    Thanks,  Beryl Meager, RN, BC-ADM Inpatient Diabetes Coordinator Pager [phone] (8a-5p)

## 2018-07-02 NOTE — TOC Transition Note (Signed)
Transition of Care [facility]) - CM/SW Discharge Note   Patient Details  Name: Laurie Ortiz MRN: [id number] Date of Birth: 08-May-1990  Transition of Care [facility]) CM/SW Contact:  Chapman Fitch, RN Phone Number: 07/02/2018, 12:27 PM   Clinical Narrative:     [facility] consult for "Patient was dismissed from her PCP/Endocrinology practice last week. I have asked Dr. Juliene Pina to refer her to [facility] endocrinology since they are "referral based only"  RNCM contacted referral center at [phone]   Per the coordinator I can submit the required information for the referral.  Under normal circumstances they then reach out to the patient to set up an initial appointment.  However at this time they are not making any new patient appointments due to the [facility] Virus. Patient will need A1C results with in the last 4 months. This information was relayed to MD, DM coordinator, and patient  Patient to have A1C drawn prior to discharge. RNCM to fax results, notes, and demographics to [phone]  Final next level of care: Home/Self Care Barriers to Discharge: Barriers Resolved   Patient Goals and CMS Choice        Discharge Placement                       Discharge Plan and Services                          Social Determinants of Health (SDOH) Interventions     Readmission Risk Interventions No flowsheet data found.

## 2018-07-02 NOTE — Discharge Summary (Addendum)
[facility] [place] [facility]   PATIENT NAME: Allena KatzChristine Heuerman    MR#:  [id number]  DATE OF BIRTH:  01-31-1991  DATE OF ADMISSION:  06/30/2018 ADMITTING PHYSICIAN: Milagros LollSrikar Sudini, MD  DATE OF DISCHARGE: 07/02/2018  PRIMARY CARE PHYSICIAN: Patient, No Pcp Per    ADMISSION DIAGNOSIS:  Type 1 diabetes mellitus with ketoacidosis without coma, with long-term current use of insulin (HCC) [E11.10, Z79.4]  DISCHARGE DIAGNOSIS:  Active Problems:   DKA (diabetic ketoacidoses) (HCC)   SECONDARY DIAGNOSIS:   Past Medical History:  Diagnosis Date  . Diabetes mellitus, type 1 (HCC)   . History of chlamydia 08/2015  . History of Papanicolaou smear of cervix 04/22/13; 08/30/15   NEG CT/GC NEG; NEG CT-POS/GC/TR NEG  . History of vaginitis   . Obesity (BMI 30.0-34.9)     HOSPITAL COURSE:   28 year old female with type 1 diabetes on insulin and oral medications who presents with abdominal pain and found to have DKA.  1.  DKA: DKA has resolved. She will be discharged on Toujeo, Humalog as recommended by diabetes nurse coordinator.   Her primary care physician will need to make a [facility].    2.  Abdominal pain: CT the abdomen was essentially unremarkable.  Abdominal pain is due to DKA which is resolved.  3.  Hypokalemia from DKA: Replaced  4. Low Phos : Repleted  DISCHARGE CONDITIONS AND DIET:   Stable diabetic diet  CONSULTS OBTAINED:  Treatment Team:  Pccm, [facility], MD  DRUG ALLERGIES:   Allergies  Allergen Reactions  . Penicillins     Doesn't take any medications ending in "cillin"  . Amoxicillin Hives and Rash    (rash 09/1996)    DISCHARGE MEDICATIONS:   Allergies as of 07/02/2018      Reactions   Penicillins    Doesn't take any medications ending in "cillin"   Amoxicillin Hives, Rash   (rash 09/1996)      Medication List    STOP taking these medications   Jardiance 25 MG Tabs tablet Generic drug:   empagliflozin   NovoLOG Mix 70/30 FlexPen (70-30) 100 UNIT/ML FlexPen Generic drug:  insulin aspart protamine - aspart   Trulicity 0.75 MG/0.5ML Sopn Generic drug:  Dulaglutide     TAKE these medications   COOL BLOOD GLUCOSE TEST STRIPS VI Use to check blood sugars up to 6 times daily   Fifty50 Pen Needles 31G X 8 MM Misc Generic drug:  Insulin Pen Needle Use as directed four times daily   FreeStyle Libre Sensor System Misc   GlucoCom Blood Glucose Monitor Devi 1 each by XX route as directed.   insulin lispro 100 UNIT/ML KwikPen Commonly known as:  HumaLOG KwikPen -Humalog 1 unit for every 10 grams of carbohydrate (with each meal and snack) -Meal time Humalog correction scale before meals (to be added to meal coverage): 141-180 mg/dL- 1 unit 191-478181-220 mg/dL- 2 units 295-621221-260 mg/dL- 3 units 308-657261-300 mg/dL- 4 units 846-962301-340 mg/dL- 5 units 952-841341-380 mg/dL- 6 units 324-401381-420 mg/dL- 7 units -Bedtime Humalog correction scale:             181-240 mg/dL-1 unit             027-253241-300 mg/dL-2 units             664-403301-360 mg/dL-3 units             474-259361-420 mg/dL-4 units  Check blood sugars 4-5 times a day.  Check first thing in  the morning, before each meal and at bedtime   levonorgestrel 20 MCG/24HR IUD Commonly known as:  MIRENA 1 each by Intrauterine route once.   simvastatin 20 MG tablet Commonly known as:  ZOCOR Take 20 mg by mouth at bedtime.   Toujeo SoloStar 300 UNIT/ML Sopn Generic drug:  Insulin Glargine (1 Unit Dial) Inject 20-25 Units into the skin at bedtime. Takes 22 at night 10:00 PM What changed:    when to take this  additional instructions         Today   CHIEF COMPLAINT:  Patient doing well   VITAL SIGNS:  Blood pressure 121/76, pulse 83, temperature 97.6 F (36.4 C), temperature source Oral, resp. rate 18, height 5\' 7"  (1.702 m), weight 101.3 kg, SpO2 99 %.   REVIEW OF SYSTEMS:  Review of Systems  Constitutional: Negative.  Negative for chills,  fever and malaise/fatigue.  HENT: Negative.  Negative for ear discharge, ear pain, hearing loss, nosebleeds and sore throat.   Eyes: Negative.  Negative for blurred vision and pain.  Respiratory: Negative.  Negative for cough, hemoptysis, shortness of breath and wheezing.   Cardiovascular: Negative.  Negative for chest pain, palpitations and leg swelling.  Gastrointestinal: Negative.  Negative for abdominal pain, blood in stool, diarrhea, nausea and vomiting.  Genitourinary: Negative.  Negative for dysuria.  Musculoskeletal: Negative.  Negative for back pain.  Skin: Negative.   Neurological: Negative for dizziness, tremors, speech change, focal weakness, seizures and headaches.  Endo/Heme/Allergies: Negative.  Does not bruise/bleed easily.  Psychiatric/Behavioral: Negative.  Negative for depression, hallucinations and suicidal ideas.     PHYSICAL EXAMINATION:  GENERAL:  28 y.o.-year-old patient lying in the bed with no acute distress.  NECK:  Supple, no jugular venous distention. No thyroid enlargement, no tenderness.  LUNGS: Normal breath sounds bilaterally, no wheezing, rales,rhonchi  No use of accessory muscles of respiration.  CARDIOVASCULAR: S1, S2 normal. No murmurs, rubs, or gallops.  ABDOMEN: Soft, non-tender, non-distended. Bowel sounds present. No organomegaly or mass.  EXTREMITIES: No pedal edema, cyanosis, or clubbing.  PSYCHIATRIC: The patient is alert and oriented x 3.  SKIN: No obvious rash, lesion, or ulcer.   DATA REVIEW:   CBC Recent Labs  Lab 07/01/18 0426  WBC 7.2  HGB 13.3  HCT 39.8  PLT 242    Chemistries  Recent Labs  Lab 07/01/18 0426  07/02/18 0401  NA 135   < > 139  K 3.3*   < > 3.2*  CL 115*   < > 113*  CO2 13*   < > 21*  GLUCOSE 114*   < > 82  BUN 12   < > 9  CREATININE 0.63   < > 0.35*  CALCIUM 7.3*   < > 7.8*  MG  --    < > 2.2  AST 12*  12*  --   --   ALT 12  12  --   --   ALKPHOS 37*  43  --   --   BILITOT 0.8  1.1  --   --     < > = values in this interval not displayed.    Cardiac Enzymes No results for input(s): TROPONINI in the last 168 hours.  Microbiology Results  @MICRORSLT48 @  RADIOLOGY:  Dg Chest Port 1 View  Result Date: 06/30/2018 CLINICAL DATA:  PICC line placement EXAM: PORTABLE CHEST 1 VIEW COMPARISON:  10/20/2013 FINDINGS: Right PICC line is been placed with the tip in the SVC. Heart and  mediastinal contours are within normal limits. No focal opacities or effusions. No acute bony abnormality. IMPRESSION: Right PICC line tip in the SVC.  No active cardiopulmonary disease. Electronically Signed   By: Charlett Nose M.D.   On: 06/30/2018 22:21   Ct Renal Stone Study  Result Date: 06/30/2018 CLINICAL DATA:  Bilateral flank pain. EXAM: CT ABDOMEN AND PELVIS WITHOUT CONTRAST TECHNIQUE: Multidetector CT imaging of the abdomen and pelvis was performed following the standard protocol without IV contrast. COMPARISON:  None. FINDINGS: Lower chest: Normal. Hepatobiliary: No focal liver abnormality is seen. No gallstones, gallbladder wall thickening, or biliary dilatation. Pancreas: Unremarkable. No pancreatic ductal dilatation or surrounding inflammatory changes. Spleen: Normal in size without focal abnormality. Adrenals/Urinary Tract: Adrenal glands are unremarkable. Kidneys are normal, without renal calculi, focal lesion, or hydronephrosis. Bladder is unremarkable. Stomach/Bowel: Stomach is within normal limits. Appendix appears normal. No evidence of bowel wall thickening, distention, or inflammatory changes. Vascular/Lymphatic: No significant vascular findings are present. No enlarged abdominal or pelvic lymph nodes. Reproductive: IUD in place. Uterus and ovaries are otherwise normal. Other: No abdominal wall hernia or abnormality. No abdominopelvic ascites. Musculoskeletal: The bones are normal other than a small ossified loose body which appears to be within the medial aspect of the left hip joint best seen on image  87 of series 5. IMPRESSION: Benign-appearing abdomen and pelvis. Small loose body in the left hip joint. Electronically Signed   By: Francene Boyers M.D.   On: 06/30/2018 15:09   [place] Ekg Site Rite  Result Date: 06/30/2018 If Site Rite image not attached, placement could not be confirmed due to current cardiac rhythm.     Allergies as of 07/02/2018      Reactions   Penicillins    Doesn't take any medications ending in "cillin"   Amoxicillin Hives, Rash   (rash 09/1996)      Medication List    STOP taking these medications   Jardiance 25 MG Tabs tablet Generic drug:  empagliflozin   NovoLOG Mix 70/30 FlexPen (70-30) 100 UNIT/ML FlexPen Generic drug:  insulin aspart protamine - aspart   Trulicity 0.75 MG/0.5ML Sopn Generic drug:  Dulaglutide     TAKE these medications   COOL BLOOD GLUCOSE TEST STRIPS VI Use to check blood sugars up to 6 times daily   Fifty50 Pen Needles 31G X 8 MM Misc Generic drug:  Insulin Pen Needle Use as directed four times daily   FreeStyle Libre Sensor System Misc   GlucoCom Blood Glucose Monitor Devi 1 each by XX route as directed.   insulin lispro 100 UNIT/ML KwikPen Commonly known as:  HumaLOG KwikPen -Humalog 1 unit for every 10 grams of carbohydrate (with each meal and snack) -Meal time Humalog correction scale before meals (to be added to meal coverage): 141-180 mg/dL- 1 unit 916-384 mg/dL- 2 units 665-993 mg/dL- 3 units 570-177 mg/dL- 4 units 939-030 mg/dL- 5 units 092-330 mg/dL- 6 units 076-226 mg/dL- 7 units -Bedtime Humalog correction scale:             181-240 mg/dL-1 unit             333-545 mg/dL-2 units             625-638 mg/dL-3 units             937-342 mg/dL-4 units  Check blood sugars 4-5 times a day.  Check first thing in the morning, before each meal and at bedtime   levonorgestrel 20 MCG/24HR IUD Commonly known as:  MIRENA 1 each by Intrauterine route once.   simvastatin 20 MG tablet Commonly known as:   ZOCOR Take 20 mg by mouth at bedtime.   Toujeo SoloStar 300 UNIT/ML Sopn Generic drug:  Insulin Glargine (1 Unit Dial) Inject 20-25 Units into the skin at bedtime. Takes 22 at night 10:00 PM What changed:    when to take this  additional instructions           Management plans discussed with the patient and she is in agreement. Stable for discharge home  Patient should follow up with pcp  CODE STATUS:     Code Status Orders  (From admission, onward)         Start     Ordered   06/30/18 1628  Full code  Continuous     06/30/18 1628        Code Status History    This patient has a current code status but no historical code status.      TOTAL TIME TAKING CARE OF THIS PATIENT: 38 minutes.    Note: This dictation was prepared with Dragon dictation along with smaller phrase technology. Any transcriptional errors that result from this process are unintentional.  Adrian Saran M.D on 07/02/2018 at 11:42 AM  Between 7am to 6pm - Pager - [phone] After 6pm go to [email or web address] - password [email or web address]  [facility]  Office  [phone]  CC: Primary care physician; Patient, No Pcp Per

## 2018-07-05 ENCOUNTER — Other Ambulatory Visit: Payer: Self-pay

## 2018-07-05 ENCOUNTER — Encounter: Payer: Self-pay | Admitting: Emergency Medicine

## 2018-07-05 ENCOUNTER — Emergency Department
Admission: EM | Admit: 2018-07-05 | Discharge: 2018-07-05 | Disposition: A | Discharge status: Home / Self Care | Payer: BLUE CROSS/BLUE SHIELD | Attending: Emergency Medicine | Admitting: Emergency Medicine

## 2018-07-05 ENCOUNTER — Telehealth: Payer: Self-pay | Admitting: *Deleted

## 2018-07-05 DIAGNOSIS — Z79899 Other long term (current) drug therapy: Secondary | ICD-10-CM | POA: Insufficient documentation

## 2018-07-05 DIAGNOSIS — N3091 Cystitis, unspecified with hematuria: Secondary | ICD-10-CM | POA: Diagnosis not present

## 2018-07-05 DIAGNOSIS — L03113 Cellulitis of right upper limb: Secondary | ICD-10-CM | POA: Insufficient documentation

## 2018-07-05 DIAGNOSIS — E876 Hypokalemia: Secondary | ICD-10-CM | POA: Diagnosis not present

## 2018-07-05 DIAGNOSIS — Z794 Long term (current) use of insulin: Secondary | ICD-10-CM | POA: Diagnosis not present

## 2018-07-05 DIAGNOSIS — R824 Acetonuria: Secondary | ICD-10-CM

## 2018-07-05 DIAGNOSIS — N3001 Acute cystitis with hematuria: Secondary | ICD-10-CM

## 2018-07-05 DIAGNOSIS — E119 Type 2 diabetes mellitus without complications: Secondary | ICD-10-CM | POA: Diagnosis not present

## 2018-07-05 LAB — BASIC METABOLIC PANEL
Anion gap: 9 (ref 5–15)
BUN: 23 mg/dL — ABNORMAL HIGH (ref 6–20)
CO2: 24 mmol/L (ref 22–32)
Calcium: 8.7 mg/dL — ABNORMAL LOW (ref 8.9–10.3)
Chloride: 105 mmol/L (ref 98–111)
Creatinine, Ser: 0.65 mg/dL (ref 0.44–1.00)
GFR calc Af Amer: >60 mL/min (ref >60)
GFR calc non Af Amer: >60 mL/min (ref >60)
Glucose, Bld: 161 mg/dL — ABNORMAL HIGH (ref 70–99)
POTASSIUM: 2.9 mmol/L — AB (ref 3.5–5.1)
Sodium: 138 mmol/L (ref 135–145)

## 2018-07-05 LAB — URINALYSIS, COMPLETE (UACMP) WITH MICROSCOPIC
BILIRUBIN URINE: NEGATIVE
GLUCOSE, UA: 150 mg/dL — AB
HGB URINE DIPSTICK: NEGATIVE
Ketones, ur: 20 mg/dL — AB
NITRITE: NEGATIVE
Protein, ur: 30 mg/dL — AB
Specific Gravity, Urine: 1.029 (ref 1.005–1.030)
pH: 5 (ref 5.0–8.0)

## 2018-07-05 LAB — CBC
HCT: 40.9 % (ref 36.0–46.0)
Hemoglobin: 13.9 g/dL (ref 12.0–15.0)
MCH: 30.2 pg (ref 26.0–34.0)
MCHC: 34 g/dL (ref 30.0–36.0)
MCV: 88.7 fL (ref 80.0–100.0)
Platelets: 244 10*3/uL (ref 150–400)
RBC: 4.61 MIL/uL (ref 3.87–5.11)
RDW: 12.3 % (ref 11.5–15.5)
WBC: 6.5 10*3/uL (ref 4.0–10.5)
nRBC: 0 % (ref 0.0–0.2)

## 2018-07-05 LAB — POCT PREGNANCY, URINE: Preg Test, Ur: NEGATIVE

## 2018-07-05 MED ORDER — SODIUM CHLORIDE 0.9 % IV BOLUS
1000.0000 mL | Freq: Once | INTRAVENOUS | Status: AC
  Administered 2018-07-05: 1000 mL via INTRAVENOUS

## 2018-07-05 MED ORDER — ONDANSETRON 4 MG PO TBDP: 4.0000 mg | ORAL_TABLET | Freq: Three times a day (TID) | ORAL | 0 refills | Status: DC | PRN

## 2018-07-05 MED ORDER — SODIUM CHLORIDE 0.9 % IV SOLN
1.0000 g | Freq: Once | INTRAVENOUS | Status: AC
  Administered 2018-07-05: 1 g via INTRAVENOUS
  Filled 2018-07-05: qty 10

## 2018-07-05 MED ORDER — CEPHALEXIN 500 MG PO CAPS: 500.0000 mg | ORAL_CAPSULE | Freq: Four times a day (QID) | ORAL | 0 refills | Status: AC

## 2018-07-05 MED ORDER — POTASSIUM CHLORIDE CRYS ER 20 MEQ PO TBCR
40.0000 meq | EXTENDED_RELEASE_TABLET | Freq: Once | ORAL | Status: AC
  Administered 2018-07-05: 40 meq via ORAL
  Filled 2018-07-05: qty 2

## 2018-07-05 MED ORDER — NITROFURANTOIN MONOHYD MACRO 100 MG PO CAPS: 100.0000 mg | ORAL_CAPSULE | Freq: Two times a day (BID) | ORAL | 0 refills | Status: DC

## 2018-07-05 NOTE — Discharge Instructions (Addendum)

## 2018-07-05 NOTE — ED Provider Notes (Signed)
[facility] Emergency Department Provider Note  ____________________________________________  Time seen: Approximately 5:34 PM  I have reviewed the triage vital signs and the nursing notes.   HISTORY  Chief Complaint lab check   HPI Laurie Ortiz is a 28 y.o. female with a history of type 1 diabetes who presents for evaluation of ketonuria.  Patient was admitted to the hospital and discharged 3 days ago for DKA.  She reports that she continues to spill ketones in her urine.  She reports that her sugars have been normal at home.  She reports mild nausea.  She denies abdominal pain, vomiting, diarrhea.  She endorses compliance with her insulin.  She denies cough, congestion, fever or chills, chest pain or shortness of breath.  She denies dysuria or hematuria. She is complaining of redness and swelling at the site of recent PICC line on the R arm  Past Medical History:  Diagnosis Date   Diabetes mellitus, type 2 (HCC)    History of chlamydia 08/2015   History of Papanicolaou smear of cervix 04/22/13; 08/30/15   NEG CT/GC NEG; NEG CT-POS/GC/TR NEG   History of vaginitis    Obesity (BMI 30.0-34.9)     Patient Active Problem List   Diagnosis Date Noted   DKA (diabetic ketoacidoses) (HCC) 06/30/2018   Chlamydia 09/18/2016   Trichimoniasis 09/18/2016   Gonorrhea 07/29/2016   Cyst of right Bartholin's gland 07/29/2016   Exposure to STD 07/21/2016    Past Surgical History:  Procedure Laterality Date   WISDOM TOOTH EXTRACTION      Prior to Admission medications   Medication Sig Start Date End Date Taking? Authorizing Provider  Blood Glucose Monitoring Suppl (GLUCOCOM BLOOD GLUCOSE MONITOR) DEVI 1 each by XX route as directed. 09/19/13   [provider]  cephALEXin (KEFLEX) 500 MG capsule Take 1 capsule (500 mg total) by mouth 4 (four) times daily for 10 days. 07/05/18 07/15/18  Nita SickleVeronese, [place], MD  Continuous Blood Gluc Sensor  (FREESTYLE LIBRE SENSOR SYSTEM) MISC  09/12/16   [provider]  Glucose Blood (COOL BLOOD GLUCOSE TEST STRIPS VI) Use to check blood sugars up to 6 times daily 01/22/15   [provider]  insulin lispro (HUMALOG KWIKPEN) 100 UNIT/ML KwikPen -Humalog 1 unit for every 10 grams of carbohydrate (with each meal and snack) -Meal time Humalog correction scale before meals (to be added to meal coverage): 141-180 mg/dL- 1 unit 161-096181-220 mg/dL- 2 units 045-409221-260 mg/dL- 3 units 811-914261-300 mg/dL- 4 units 782-956301-340 mg/dL- 5 units 213-086341-380 mg/dL- 6 units 578-469381-420 mg/dL- 7 units -Bedtime Humalog correction scale:             181-240 mg/dL-1 unit             629-528241-300 mg/dL-2 units             413-244301-360 mg/dL-3 units             010-272361-420 mg/dL-4 units  Check blood sugars 4-5 times a day.  Check first thing in the morning, before each meal and at bedtime 07/02/18   Adrian SaranMody, [place], MD  Insulin Pen Needle (FIFTY50 PEN NEEDLES) 31G X 8 MM MISC Use as directed four times daily 01/22/15   [provider]  levonorgestrel (MIRENA) 20 MCG/24HR IUD 1 each by Intrauterine route once.    [provider]  ondansetron (ZOFRAN ODT) 4 MG disintegrating tablet Take 1 tablet (4 mg total) by mouth every 8 (eight) hours as needed. 07/05/18  Don Perking, [place], MD  simvastatin (ZOCOR) 20 MG tablet Take 20 mg by mouth at bedtime.  05/06/16   [provider]  TOUJEO SOLOSTAR 300 UNIT/ML SOPN Inject 20-25 Units into the skin at bedtime. Takes 22 at night 10:00 PM 07/02/18   Adrian Saran, MD    Allergies Penicillins and Amoxicillin  Family History  Problem Relation Age of Onset   Hypertension Mother    Heart disease Maternal Grandmother    Breast cancer Paternal Grandmother        breast cancer in late 40s-died from breast cancer   Prostate cancer Paternal Grandfather    Dementia Paternal Grandfather    Parkinson's disease Paternal Grandfather     Social History Social History   Tobacco  Use   Smoking status: Never Smoker   Smokeless tobacco: Never Used  Substance Use Topics   Alcohol use: Yes    Comment: OCC   Drug use: No    Review of Systems  Constitutional: Negative for fever. Eyes: Negative for visual changes. ENT: Negative for sore throat. Neck: No neck pain  Cardiovascular: Negative for chest pain. Respiratory: Negative for shortness of breath. Gastrointestinal: Negative for abdominal pain, vomiting or diarrhea. + nausea Genitourinary: Negative for dysuria. + ketones in urine Musculoskeletal: Negative for back pain. + R arm redness and swelling Skin: Negative for rash. Neurological: Negative for headaches, weakness or numbness. Psych: No SI or HI  ____________________________________________   PHYSICAL EXAM:  VITAL SIGNS: ED Triage Vitals  Enc Vitals Group     BP 07/05/18 1630 (!) 152/91     Pulse Rate 07/05/18 1630 (!) 109     Resp --      Temp 07/05/18 1630 (!) 97.5 F (36.4 C)     Temp Source 07/05/18 1630 Oral     SpO2 07/05/18 1630 100 %     Weight 07/05/18 1630 230 lb (104.3 kg)     Height 07/05/18 1630 5\' 7"  (1.702 m)     Head Circumference --      Peak Flow --      Pain Score 07/05/18 1642 0     Pain Loc --      Pain Edu? --      Excl. in GC? --     Constitutional: Alert and oriented. Well appearing and in no apparent distress. HEENT:      Head: Normocephalic and atraumatic.         Eyes: Conjunctivae are normal. Sclera is non-icteric.       Mouth/Throat: Mucous membranes are moist.       Neck: Supple with no signs of meningismus. Cardiovascular: Tachycardic with regular rhythm. No murmurs, gallops, or rubs. 2+ symmetrical distal pulses are present in all extremities. No JVD. Respiratory: Normal respiratory effort. Lungs are clear to auscultation bilaterally. No wheezes, crackles, or rhonchi.  Gastrointestinal: Soft, non tender, and non distended with positive bowel sounds. No rebound or guarding. Musculoskeletal: There is  redness, swelling, warmth and tenderness around the area of previous PICC line.  Neurologic: Normal speech and language. Face is symmetric. Moving all extremities. No gross focal neurologic deficits are appreciated. Skin: Skin is warm, dry and intact. No rash noted. Psychiatric: Mood and affect are normal. Speech and behavior are normal.  ____________________________________________   LABS (all labs ordered are listed, but only abnormal results are displayed)  Labs Reviewed  BASIC METABOLIC PANEL - Abnormal; Notable for the following components:      Result Value   Potassium 2.9 (*)  Glucose, Bld 161 (*)    BUN 23 (*)    Calcium 8.7 (*)    All other components within normal limits  URINALYSIS, COMPLETE (UACMP) WITH MICROSCOPIC - Abnormal; Notable for the following components:   Color, Urine YELLOW (*)    APPearance CLOUDY (*)    Glucose, UA 150 (*)    Ketones, ur 20 (*)    Protein, ur 30 (*)    Leukocytes,Ua SMALL (*)    Bacteria, UA RARE (*)    All other components within normal limits  URINE CULTURE  CBC  POC URINE PREG, ED  POCT PREGNANCY, URINE  CBG MONITORING, ED   ____________________________________________  EKG  none  ____________________________________________  RADIOLOGY  none  ____________________________________________   PROCEDURES  Procedure(s) performed: None Procedures Critical Care performed:  None ____________________________________________   INITIAL IMPRESSION / ASSESSMENT AND PLAN / ED COURSE  28 y.o. female with a history of type 1 diabetes who presents for evaluation of ketonuria x 3 days since being discharged from the hospital for DKA.  Blood glucose has been normal at home.  Patient does have cellulitis at the site of her PICC line on her right arm with no evidence of drainable collection. Labs also show a UTI.  No signs of DKA or sepsis. Small amount of ketones on UA. Will give IVF and IV rocephin for UTI and cellulitis. Will  demarcate the cellulitic area for close monitoring.    Patient remains well appearing. Received IVF and rocephin. Will dc home on keflex and f/u with PCP. Discussed standard return preacutions   As part of my medical decision making, I reviewed the following data within the electronic MEDICAL RECORD NUMBER Nursing notes reviewed and incorporated, Labs reviewed , Old chart reviewed, Notes from prior ED visits and [place] Controlled Substance Database    Pertinent labs & imaging results that were available during my care of the patient were reviewed by me and considered in my medical decision making (see chart for details).    ____________________________________________   FINAL CLINICAL IMPRESSION(S) / ED DIAGNOSES  Final diagnoses:  Acute cystitis with hematuria  Ketonuria  Cellulitis of right upper extremity  Hypokalemia      NEW MEDICATIONS STARTED DURING THIS VISIT:  ED Discharge Orders         Ordered    nitrofurantoin, macrocrystal-monohydrate, (MACROBID) 100 MG capsule  2 times daily,   Status:  Discontinued     07/05/18 1733    ondansetron (ZOFRAN ODT) 4 MG disintegrating tablet  Every 8 hours PRN,   Status:  Discontinued     07/05/18 1733    ondansetron (ZOFRAN ODT) 4 MG disintegrating tablet  Every 8 hours PRN     07/05/18 1745    cephALEXin (KEFLEX) 500 MG capsule  4 times daily     07/05/18 1745           Note:  This document was prepared using Dragon voice recognition software and may include unintentional dictation errors.    Don Perking, [place], MD 07/05/18 [phone]

## 2018-07-05 NOTE — ED Notes (Signed)
Pt verbalized understanding of d/c instructions, RX, and f/u care. No further questions at this time. Pt ambulatory to the exit with steady gait  

## 2018-07-05 NOTE — ED Triage Notes (Signed)
Here for check of labs to make sure not in DKA. Pt PCP is closed.  Was admitted last week for DKA and in ICU.  Pt sent home Friday but still spilling large amounts of ketones in urine and so doctor told her to come get checked. VSS. Unlabored. Ambulatory. No fevers.

## 2018-07-06 LAB — URINE CULTURE

## 2018-07-13 LAB — BLOOD GAS, VENOUS
PATIENT TEMPERATURE: 37
pCO2, Ven: 24 mmHg — ABNORMAL LOW (ref 44.0–60.0)
pH, Ven: 7.03 — CL (ref 7.250–7.430)

## 2018-07-21 ENCOUNTER — Other Ambulatory Visit: Payer: Self-pay | Admitting: Obstetrics and Gynecology

## 2018-07-21 ENCOUNTER — Telehealth: Payer: Self-pay

## 2018-07-21 MED ORDER — FLUCONAZOLE 150 MG PO TABS: 150.0000 mg | ORAL_TABLET | Freq: Once | ORAL | 0 refills | Status: AC

## 2018-07-21 NOTE — Telephone Encounter (Signed)
Pt aware.

## 2018-07-21 NOTE — Telephone Encounter (Signed)
Pls let pt know Rx diflucan eRxd.

## 2018-07-21 NOTE — Telephone Encounter (Signed)
Pt c/o having yeast infection symptoms. Itching, white d/c, burning. Could you send in something for her please? Or does she need to come in?

## 2018-08-25 ENCOUNTER — Other Ambulatory Visit: Payer: Self-pay

## 2018-08-25 ENCOUNTER — Ambulatory Visit (INDEPENDENT_AMBULATORY_CARE_PROVIDER_SITE_OTHER): Payer: BLUE CROSS/BLUE SHIELD

## 2018-08-25 ENCOUNTER — Telehealth: Payer: Self-pay

## 2018-08-25 DIAGNOSIS — N3001 Acute cystitis with hematuria: Secondary | ICD-10-CM

## 2018-08-25 DIAGNOSIS — M545 Low back pain, unspecified: Secondary | ICD-10-CM

## 2018-08-25 DIAGNOSIS — R300 Dysuria: Secondary | ICD-10-CM

## 2018-08-25 LAB — POCT URINALYSIS DIPSTICK
Appearance: ABNORMAL
Bilirubin, UA: NEGATIVE
Glucose, UA: POSITIVE — AB
Leukocytes, UA: NEGATIVE
Nitrite, UA: NEGATIVE
Odor: NORMAL
Protein, UA: POSITIVE — AB
Spec Grav, UA: <=1.005 — AB (ref 1.010–1.025)
Urobilinogen, UA: 0.2 E.U./dL
pH, UA: 5 (ref 5.0–8.0)

## 2018-08-25 MED ORDER — NITROFURANTOIN MONOHYD MACRO 100 MG PO CAPS: 100.0000 mg | ORAL_CAPSULE | Freq: Two times a day (BID) | ORAL | 0 refills | Status: AC

## 2018-08-25 NOTE — Telephone Encounter (Signed)
Pt is 99% sure she has a UTI; has back pain, urine is dark and has an odor, she's tired, has burning now and then.  [phone]

## 2018-08-25 NOTE — Telephone Encounter (Signed)
Spoke w/pt. Apt scheduled for nurse visit today at 3:20.

## 2018-08-25 NOTE — Telephone Encounter (Signed)
Pt aware of UA with hematuria. Pt denies any vag bleeding currently. Hx of GBS on C&S in past but has PCN allergy. Rx macrobid. Check C&S. F/u prn.

## 2018-08-25 NOTE — Telephone Encounter (Signed)
U/A done. Note routed to [facility] for review. Culture drawn.

## 2018-08-25 NOTE — Telephone Encounter (Signed)
Pls see if pt can come by for UA with nurse, then send for C&S.

## 2018-08-25 NOTE — Progress Notes (Signed)
Patient presents to office to produce urine specimen for dysuria and low back pain. U/A performed per [facility] orders. Results recorded.

## 2018-08-25 NOTE — Addendum Note (Signed)
Addended by: Kathlene Cote on: 08/25/2018 03:58 PM   Modules accepted: Orders

## 2018-08-28 ENCOUNTER — Other Ambulatory Visit: Payer: Self-pay | Admitting: Obstetrics and Gynecology

## 2018-08-28 LAB — URINE CULTURE

## 2018-08-28 MED ORDER — SULFAMETHOXAZOLE-TRIMETHOPRIM 800-160 MG PO TABS: 1.0000 | ORAL_TABLET | Freq: Two times a day (BID) | ORAL | 0 refills | Status: AC

## 2018-08-30 ENCOUNTER — Other Ambulatory Visit (HOSPITAL_COMMUNITY)
Admission: RE | Admit: 2018-08-30 | Discharge: 2018-08-30 | Disposition: A | Discharge status: Home / Self Care | Payer: BLUE CROSS/BLUE SHIELD | Source: Ambulatory Visit | Attending: Obstetrics & Gynecology | Admitting: Obstetrics & Gynecology

## 2018-08-30 ENCOUNTER — Other Ambulatory Visit: Payer: Self-pay

## 2018-08-30 ENCOUNTER — Ambulatory Visit (INDEPENDENT_AMBULATORY_CARE_PROVIDER_SITE_OTHER): Payer: BLUE CROSS/BLUE SHIELD | Admitting: Obstetrics & Gynecology

## 2018-08-30 ENCOUNTER — Encounter: Payer: Self-pay | Admitting: Obstetrics & Gynecology

## 2018-08-30 VITALS — BP 130/80 | Ht 67.0 in | Wt 250.0 lb

## 2018-08-30 DIAGNOSIS — N898 Other specified noninflammatory disorders of vagina: Secondary | ICD-10-CM | POA: Insufficient documentation

## 2018-08-30 DIAGNOSIS — Z113 Encounter for screening for infections with a predominantly sexual mode of transmission: Secondary | ICD-10-CM

## 2018-08-30 MED ORDER — FLUCONAZOLE 150 MG PO TABS: 150.0000 mg | ORAL_TABLET | Freq: Once | ORAL | 3 refills | Status: AC

## 2018-08-30 NOTE — Patient Instructions (Signed)
Fluconazole tablets What is this medicine? FLUCONAZOLE (floo KON na zole) is an antifungal medicine. It is used to treat certain kinds of fungal or yeast infections. This medicine may be used for other purposes; ask your health care provider or pharmacist if you have questions. COMMON BRAND NAME(S): Diflucan What should I tell my health care provider before I take this medicine? They need to know if you have any of these conditions: -history of irregular heart beat -kidney disease -an unusual or allergic reaction to fluconazole, other azole antifungals, medicines, foods, dyes, or preservatives -pregnant or trying to get pregnant -breast-feeding How should I use this medicine? Take this medicine by mouth. Follow the directions on the prescription label. Do not take your medicine more often than directed. Talk to your pediatrician regarding the use of this medicine in children. Special care may be needed. This medicine has been used in children as young as 6 months of age. Overdosage: If you think you have taken too much of this medicine contact a poison control center or emergency room at once. NOTE: This medicine is only for you. Do not share this medicine with others. What if I miss a dose? If you miss a dose, take it as soon as you can. If it is almost time for your next dose, take only that dose. Do not take double or extra doses. What may interact with this medicine? Do not take this medicine with any of the following medications: -astemizole -certain medicines for irregular heart beat like dofetilide, dronedarone, quinidine -cisapride -erythromycin -lomitapide -other medicines that prolong the QT interval (cause an abnormal heart rhythm) -pimozide -terfenadine -thioridazine -tolvaptan -ziprasidone This medicine may also interact with the following medications: -antiviral medicines for HIV or AIDS -birth control pills -certain antibiotics like rifabutin, rifampin -certain  medicines for blood pressure like amlodipine, isradipine, felodipine, hydrochlorothiazide, losartan, nifedipine -certain medicines for cancer like cyclophosphamide, vinblastine, vincristine -certain medicines for cholesterol like atorvastatin, lovastatin, fluvastatin, simvastatin -certain medicines for depression, anxiety, or psychotic disturbances like amitriptyline, midazolam, nortriptyline, triazolam -certain medicines for diabetes like glipizide, glyburide, tolbutamide -certain medicines for pain like alfentanil, fentanyl, methadone -certain medicines for seizures like carbamazepine, phenytoin -certain medicines that treat or prevent blood clots like warfarin -halofantrine -medicines that lower your chance of fighting infection like cyclosporine, prednisone, tacrolimus -NSAIDS, medicines for pain and inflammation, like celecoxib, diclofenac, flurbiprofen, ibuprofen, meloxicam, naproxen -other medicines for fungal infections -sirolimus -theophylline -tofacitinib This list may not describe all possible interactions. Give your health care provider a list of all the medicines, herbs, non-prescription drugs, or dietary supplements you use. Also tell them if you smoke, drink alcohol, or use illegal drugs. Some items may interact with your medicine. What should I watch for while using this medicine? Visit your doctor or health care professional for regular checkups. If you are taking this medicine for a long time you may need blood work. Tell your doctor if your symptoms do not improve. Some fungal infections need many weeks or months of treatment to cure. Alcohol can increase possible damage to your liver. Avoid alcoholic drinks. If you have a vaginal infection, do not have sex until you have finished your treatment. You can wear a sanitary napkin. Do not use tampons. Wear freshly washed cotton, not synthetic, panties. What side effects may I notice from receiving this medicine? Side effects that  you should report to your doctor or health care professional as soon as possible: -allergic reactions like skin rash or itching, hives, swelling of the   lips, mouth, tongue, or throat -dark urine -feeling dizzy or faint -irregular heartbeat or chest pain -redness, blistering, peeling or loosening of the skin, including inside the mouth -trouble breathing -unusual bruising or bleeding -vomiting -yellowing of the eyes or skin Side effects that usually do not require medical attention (report to your doctor or health care professional if they continue or are bothersome): -changes in how food tastes -diarrhea -headache -stomach upset or nausea This list may not describe all possible side effects. Call your doctor for medical advice about side effects. You may report side effects to FDA at 1-800-FDA-1088. Where should I keep my medicine? Keep out of the reach of children. Store at room temperature below 30 degrees C (86 degrees F). Throw away any medicine after the expiration date. NOTE: This sheet is a summary. It may not cover all possible information. If you have questions about this medicine, talk to your doctor, pharmacist, or health care provider.  2019 Elsevier/Gold Standard (2012-11-06 19:37:38)  

## 2018-08-30 NOTE — Progress Notes (Signed)
HPI:      [name] is a 28 y.o. G1P1001 who LMP was No LMP recorded. (Menstrual status: IUD)., presents today for a problem visit.  She complains of:  Vaginitis: Patient complains of an abnormal vaginal discharge for 2 weeksfollowing ABX for UTI recently. Vaginal symptoms include discharge described as white, creamy and malodorous and odor.Vulvar symptoms include none.STI Risk: Possible STD exposureDischarge described as: white and malodorous.Other associated symptoms: none.Menstrual pattern: She had been bleeding rarely w IUD (Mirena). Contraception: IUD  Frequent yeast infections, may be due to her diabetes and weight gain as well.  PMHx: She  has a past medical history of Diabetes mellitus, type 2 (HCC), History of chlamydia (08/2015), History of Papanicolaou smear of cervix (04/22/13; 08/30/15), History of vaginitis, and Obesity (BMI 30.0-34.9). Also,  has a past surgical history that includes Wisdom tooth extraction., family history includes Breast cancer in her paternal grandmother; Dementia in her paternal grandfather; Heart disease in her maternal grandmother; Hypertension in her mother; Parkinson's disease in her paternal grandfather; Prostate cancer in her paternal grandfather.,  reports that she has never smoked. She has never used smokeless tobacco. She reports current alcohol use. She reports that she does not use drugs.  She has a current medication list which includes the following prescription(s): glucocom blood glucose monitor, freestyle libre sensor system, glucose blood, insulin lispro, insulin pen needle, levonorgestrel, simvastatin, sulfamethoxazole-trimethoprim, toujeo solostar, nitrofurantoin (macrocrystal-monohydrate), and ondansetron. Also, is allergic to penicillins and amoxicillin.  Review of Systems  Constitutional: Negative for chills, fever and malaise/fatigue.  HENT: Negative for congestion, sinus pain and sore throat.   Eyes: Negative for blurred vision and  pain.  Respiratory: Negative for cough and wheezing.   Cardiovascular: Negative for chest pain and leg swelling.  Gastrointestinal: Negative for abdominal pain, constipation, diarrhea, heartburn, nausea and vomiting.  Genitourinary: Negative for dysuria, frequency, hematuria and urgency.  Musculoskeletal: Negative for back pain, joint pain, myalgias and neck pain.  Skin: Negative for itching and rash.  Neurological: Negative for dizziness, tremors and weakness.  Endo/Heme/Allergies: Does not bruise/bleed easily.  Psychiatric/Behavioral: Negative for depression. The patient is not nervous/anxious and does not have insomnia.     Objective: BP 130/80   Ht 5\' 7"  (1.702 m)   Wt 250 lb (113.4 kg)   BMI 39.16 kg/m  Physical Exam Constitutional:      General: She is not in acute distress.    Appearance: She is well-developed.  Genitourinary:     Pelvic exam was performed with patient supine.     Vagina and uterus normal.     No vaginal erythema or bleeding.     No cervical motion tenderness, discharge, polyp or nabothian cyst.     Uterus is mobile.     Uterus is not enlarged.     No uterine mass detected.    Uterus is midaxial.     No right or left adnexal mass present.     Right adnexa not tender.     Left adnexa not tender.  HENT:     Head: Normocephalic and atraumatic.     Nose: Nose normal.  Abdominal:     General: There is no distension.     Palpations: Abdomen is soft.     Tenderness: There is no abdominal tenderness.  Musculoskeletal: Normal range of motion.  Neurological:     Mental Status: She is alert and oriented to person, place, and time.     Cranial Nerves: No cranial nerve deficit.  Skin:    General: Skin is warm and dry.    WET PREP:   positive hyphae Findings are consistent with monilia vaginitis.   ASSESSMENT/PLAN:    Problem List Items Addressed This Visit    Screen for STD (sexually transmitted disease)    -  Primary   Relevant Orders   Cytology -  PAP/ STD testing   Vaginal discharge       Relevant Orders   Wet Prep Cultures    Treat w Diflucan and monitor sx's for recurrence Suppressive therapy discussed for frequent recurrences  Annamarie Major, MD, [facility], [facility] 08/30/2018  11:44 AM

## 2018-09-01 LAB — CYTOLOGY - PAP
Chlamydia: NEGATIVE
Diagnosis: NEGATIVE
Neisseria Gonorrhea: NEGATIVE
Trichomonas: NEGATIVE

## 2018-09-22 ENCOUNTER — Other Ambulatory Visit: Payer: Self-pay | Admitting: Obstetrics and Gynecology

## 2018-09-22 ENCOUNTER — Encounter: Payer: Self-pay | Admitting: Obstetrics and Gynecology

## 2018-09-22 ENCOUNTER — Other Ambulatory Visit: Payer: Self-pay

## 2018-09-22 ENCOUNTER — Ambulatory Visit (INDEPENDENT_AMBULATORY_CARE_PROVIDER_SITE_OTHER): Payer: BC Managed Care – PPO | Admitting: Obstetrics and Gynecology

## 2018-09-22 VITALS — BP 118/78 | Ht 67.0 in | Wt 251.0 lb

## 2018-09-22 DIAGNOSIS — Z30432 Encounter for removal of intrauterine contraceptive device: Secondary | ICD-10-CM

## 2018-09-22 NOTE — Progress Notes (Signed)
   Chief Complaint  Patient presents with  . Contraception    IUD Removal desires pregnancy     History of Present Illness:  Laurie Ortiz is a 28 y.o. that had a Mirena IUD placed approximately 4 years ago. Since that time, she denies dyspareunia, pelvic pain, non-menstrual bleeding, vaginal d/c, heavy bleeding. She is interested in conception.    BP 118/78 (BP Location: Left Arm, Patient Position: Sitting, Cuff Size: Normal)   Ht 5\' 7"  (1.702 m)   Wt 251 lb (113.9 kg)   BMI 39.31 kg/m   Pelvic exam:  Two IUD strings present seen coming from the cervical os. EGBUS, vaginal vault and cervix: within normal limits  IUD Removal Strings of IUD identified and grasped.  IUD removed without problem with Boseman forceps.  Pt tolerated this well.  IUD noted to be intact.  Assessment:  Encounter for IUD removal    Plan: IUD removed and plan for conception. Start PNVs. F/u with pos UPT for NOB.   Alicia B. Copland, PA-C 09/22/2018 9:57 AM

## 2018-09-22 NOTE — Patient Instructions (Signed)
I value your feedback and entrusting us with your care. If you get a [facility] patient survey, I would appreciate you taking the time to let us know about your experience today. Thank you! 

## 2018-10-13 DIAGNOSIS — A6000 Herpesviral infection of urogenital system, unspecified: Secondary | ICD-10-CM

## 2018-10-13 HISTORY — DX: Herpesviral infection of urogenital system, unspecified: A60.00

## 2018-11-08 ENCOUNTER — Other Ambulatory Visit: Payer: Self-pay

## 2018-11-08 ENCOUNTER — Ambulatory Visit (INDEPENDENT_AMBULATORY_CARE_PROVIDER_SITE_OTHER): Payer: BC Managed Care – PPO | Admitting: Obstetrics and Gynecology

## 2018-11-08 ENCOUNTER — Other Ambulatory Visit (HOSPITAL_COMMUNITY)
Admission: RE | Admit: 2018-11-08 | Discharge: 2018-11-08 | Disposition: A | Discharge status: Home / Self Care | Payer: BC Managed Care – PPO | Source: Ambulatory Visit | Attending: Obstetrics and Gynecology | Admitting: Obstetrics and Gynecology

## 2018-11-08 ENCOUNTER — Encounter: Payer: Self-pay | Admitting: Obstetrics and Gynecology

## 2018-11-08 VITALS — BP 136/80 | Ht 68.0 in | Wt 259.2 lb

## 2018-11-08 DIAGNOSIS — R81 Glycosuria: Secondary | ICD-10-CM

## 2018-11-08 DIAGNOSIS — R350 Frequency of micturition: Secondary | ICD-10-CM | POA: Diagnosis not present

## 2018-11-08 DIAGNOSIS — N898 Other specified noninflammatory disorders of vagina: Secondary | ICD-10-CM | POA: Diagnosis not present

## 2018-11-08 DIAGNOSIS — Z113 Encounter for screening for infections with a predominantly sexual mode of transmission: Secondary | ICD-10-CM

## 2018-11-08 DIAGNOSIS — Z30011 Encounter for initial prescription of contraceptive pills: Secondary | ICD-10-CM

## 2018-11-08 LAB — POCT URINALYSIS DIPSTICK
Bilirubin, UA: NEGATIVE
Blood, UA: NEGATIVE
Glucose, UA: POSITIVE — AB
Ketones, UA: NEGATIVE
Leukocytes, UA: NEGATIVE
Nitrite, UA: NEGATIVE
Protein, UA: NEGATIVE
Spec Grav, UA: 1.02 (ref 1.010–1.025)
pH, UA: 5 (ref 5.0–8.0)

## 2018-11-08 LAB — GLUCOSE, POCT (MANUAL RESULT ENTRY): POC Glucose: 325 mg/dl — AB (ref 70–99)

## 2018-11-08 LAB — POCT WET PREP WITH KOH
Clue Cells Wet Prep HPF POC: NEGATIVE
KOH Prep POC: NEGATIVE
Trichomonas, UA: NEGATIVE
Yeast Wet Prep HPF POC: NEGATIVE

## 2018-11-08 MED ORDER — LEVONORGEST-ETH ESTRAD 91-DAY 0.15-0.03 &0.01 MG PO TABS: 1.0000 | ORAL_TABLET | Freq: Every day | ORAL | 2 refills | Status: DC

## 2018-11-08 NOTE — Patient Instructions (Signed)
I value your feedback and entrusting us with your care. If you get a [facility] patient survey, I would appreciate you taking the time to let us know about your experience today. Thank you! 

## 2018-11-08 NOTE — Progress Notes (Signed)
Patient, No Pcp Per   Chief Complaint  Patient presents with  . Vaginitis    discharge,itching/irritation, no odor x 2 days  . Urinary Tract Infection    lower back pain, urinary frequency and burning, strong odor in urine, no blood x 2 days    HPI:      [name] is a 28 y.o. G1P1001 who LMP was Patient's last menstrual period was 10/26/2018 (exact date)., presents today for increased vag d/c with irritation for a couple days. Has odor during sex but none otherwise. Also notes some tears in vag skin that are uncomfortable. She has had urinary frequency with good flow, dysuria, odor for several days. Has LBP anyway, but no pelvic pain, fevers, flu-like sx. Hx of Proteus on C&S 08/25/18, resolved with abx use.  Pt is sex active, no new partners. Removed IUD 6/20 to conceive but she and partner aren't doing well, so doesn't want pregnancy now. Would like to restart OCPs. Did seasonique in past without side effects. No hx of HTN, DVTs, migraines. Has type 2 DM, not well controlled recently due to stress. Hx of DKA. Hx of chlamydia in past.   Past Medical History:  Diagnosis Date  . Diabetes mellitus, type 2 ([facility])   . History of chlamydia 08/2015  . History of Papanicolaou smear of cervix 04/22/13; 08/30/15   NEG CT/GC NEG; NEG CT-POS/GC/TR NEG  . History of vaginitis   . Obesity (BMI 30.0-34.9)     Past Surgical History:  Procedure Laterality Date  . WISDOM TOOTH EXTRACTION      Family History  Problem Relation Age of Onset  . Hypertension Mother   . Heart disease Maternal Grandmother   . Breast cancer Paternal Grandmother        breast cancer in late 40s-died from breast cancer  . Prostate cancer Paternal Grandfather   . Dementia Paternal Grandfather   . Parkinson's disease Paternal Grandfather     Social History   Socioeconomic History  . Marital status: Single    Spouse name: Not on file  . Number of children: 1  . Years of education: 23  . Highest  education level: Not on file  Occupational History  . Occupation: Best boy: [employer]  . Financial resource strain: Not on file  . Food insecurity    Worry: Not on file    Inability: Not on file  . Transportation needs    Medical: Not on file    Non-medical: Not on file  Tobacco Use  . Smoking status: Never Smoker  . Smokeless tobacco: Never Used  Substance and Sexual Activity  . Alcohol use: Yes    Comment: OCC  . Drug use: No  . Sexual activity: Yes    Partners: Male    Birth control/protection: I.U.D.    Comment: Mirena  Lifestyle  . Physical activity    Days per week: Not on file    Minutes per session: Not on file  . Stress: Not on file  Relationships  . Social Herbalist on phone: Not on file    Gets together: Not on file    Attends religious service: Not on file    Active member of club or organization: Not on file    Attends meetings of clubs or organizations: Not on file    Relationship status: Not on file  . Intimate partner violence    Fear of current or  ex partner: Not on file    Emotionally abused: Not on file    Physically abused: Not on file    Forced sexual activity: Not on file  Other Topics Concern  . Not on file  Social History Narrative  . Not on file    Outpatient Medications Prior to Visit  Medication Sig Dispense Refill  . Blood Glucose Monitoring Suppl (GLUCOCOM BLOOD GLUCOSE MONITOR) DEVI 1 each by XX route as directed.    . Continuous Blood Gluc Sensor (FREESTYLE LIBRE SENSOR SYSTEM) MISC     . Glucose Blood (COOL BLOOD GLUCOSE TEST STRIPS VI) Use to check blood sugars up to 6 times daily    . insulin lispro (HUMALOG KWIKPEN) 100 UNIT/ML KwikPen -Humalog 1 unit for every 10 grams of carbohydrate (with each meal and snack) -Meal time Humalog correction scale before meals (to be added to meal coverage): 141-180 mg/dL- 1 unit 161-096181-220 mg/dL- 2 units 045-409221-260 mg/dL- 3 units 811-914261-300 mg/dL- 4 units 782-956301-340  mg/dL- 5 units 213-086341-380 mg/dL- 6 units 578-469381-420 mg/dL- 7 units -Bedtime Humalog correction scale:             181-240 mg/dL-1 unit             629-528241-300 mg/dL-2 units             413-244301-360 mg/dL-3 units             010-272361-420 mg/dL-4 units  Check blood sugars 4-5 times a day.  Check first thing in the morning, before each meal and at bedtime 15 mL 11  . Insulin Pen Needle (FIFTY50 PEN NEEDLES) 31G X 8 MM MISC Use as directed four times daily    . NOVOLOG FLEXPEN 100 UNIT/ML FlexPen USE ATTATCHED SLIDING SCALE AND INJECT 4 5 TIMES DAILY    . ondansetron (ZOFRAN ODT) 4 MG disintegrating tablet Take 1 tablet (4 mg total) by mouth every 8 (eight) hours as needed. 20 tablet 0  . simvastatin (ZOCOR) 20 MG tablet Take 20 mg by mouth at bedtime.     Nathen May. TOUJEO SOLOSTAR 300 UNIT/ML SOPN Inject 20-25 Units into the skin at bedtime. Takes 22 at night 10:00 PM 22 pen 0   No facility-administered medications prior to visit.       ROS:  Review of Systems  Constitutional: Negative for fatigue, fever and unexpected weight change.  Respiratory: Negative for cough, shortness of breath and wheezing.   Cardiovascular: Negative for chest pain, palpitations and leg swelling.  Gastrointestinal: Negative for blood in stool, constipation, diarrhea, nausea and vomiting.  Endocrine: Negative for cold intolerance, heat intolerance and polyuria.  Genitourinary: Positive for dysuria, frequency, genital sores and vaginal discharge. Negative for dyspareunia, flank pain, hematuria, menstrual problem, pelvic pain, urgency, vaginal bleeding and vaginal pain.  Musculoskeletal: Negative for back pain, joint swelling and myalgias.  Skin: Negative for rash.  Neurological: Negative for dizziness, syncope, light-headedness, numbness and headaches.  Hematological: Negative for adenopathy.  Psychiatric/Behavioral: Negative for agitation, confusion, sleep disturbance and suicidal ideas. The patient is not nervous/anxious.      OBJECTIVE:   Vitals:  BP 136/80   Ht 5\' 8"  (1.727 m)   Wt 259 lb 3.2 oz (117.6 kg)   LMP 10/26/2018 (Exact Date)   BMI 39.41 kg/m   Physical Exam Vitals signs reviewed.  Constitutional:      Appearance: She is well-developed.  Neck:     Musculoskeletal: Normal range of motion.  Pulmonary:     Effort: Pulmonary effort is  normal.  Genitourinary:    Pubic Area: No rash.      Labia:        Right: Lesion present. No rash or tenderness.        Left: Lesion present. No rash or tenderness.      Vagina: Vaginal discharge present. No erythema or tenderness.     Cervix: Normal.     Uterus: Normal. Not enlarged and not tender.      Adnexa: Right adnexa normal and left adnexa normal.       Right: No mass or tenderness.         Left: No mass or tenderness.      Musculoskeletal: Normal range of motion.  Lymphadenopathy:     Lower Body: No right inguinal adenopathy. No left inguinal adenopathy.  Skin:    General: Skin is warm and dry.  Neurological:     General: No focal deficit present.     Mental Status: She is alert and oriented to person, place, and time.  Psychiatric:        Mood and Affect: Mood normal.        Behavior: Behavior normal.        Thought Content: Thought content normal.        Judgment: Judgment normal.     Results: Results for orders placed or performed in visit on 11/08/18 (from the past 24 hour(s))  POCT Wet Prep with KOH     Status: Normal   Collection Time: 11/08/18 11:56 AM  Result Value Ref Range   Trichomonas, UA Negative    Clue Cells Wet Prep HPF POC neg    Epithelial Wet Prep HPF POC     Yeast Wet Prep HPF POC neg    Bacteria Wet Prep HPF POC     RBC Wet Prep HPF POC     WBC Wet Prep HPF POC     KOH Prep POC Negative Negative  POCT Urinalysis Dipstick     Status: Abnormal   Collection Time: 11/08/18 11:56 AM  Result Value Ref Range   Color, UA yellow    Clarity, UA cloudy    Glucose, UA Positive (A) Negative   Bilirubin, UA neg     Ketones, UA neg    Spec Grav, UA 1.020 1.010 - 1.025   Blood, UA neg    pH, UA 5.0 5.0 - 8.0   Protein, UA Negative Negative   Urobilinogen, UA     Nitrite, UA neg    Leukocytes, UA Negative Negative   Appearance     Odor    POCT Glucose (CBG)     Status: Abnormal   Collection Time: 11/08/18 11:58 AM  Result Value Ref Range   POC Glucose 325 (A) 70 - 99 mg/dl   4+ URINE GLUCOSE--recently ate  Assessment/Plan: Vaginal lesion - Plan: HSV NAA, Few ulcerative lesions bilat labia minora. Resolving. Check culture. 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 Valtrex Rx not needed. Sitz baths. Discussed episodic vs preventive tx. Will f/u with results.   Vaginal discharge - Plan: POCT Wet Prep with KOH, Cervicovaginal ancillary only, Neg wet prep. Check gon/chlam/BV on swab. Will f/u with results.   Screening for STD (sexually transmitted disease) - Plan: Cervicovaginal ancillary only,   Urinary frequency - Plan: POCT Urinalysis Dipstick, Urine Culture, Neg UA for UTI. Check C&S. If neg, most likely polyuria due to poor DM control.   Glucosuria - Plan: POCT Glucose (CBG), Elevated blood sugar. Pt just ate,  taking insulin.   Encounter for initial prescription of contraceptive pills - Plan: Levonorgestrel-Ethinyl Estradiol (AMETHIA) 0.15-0.03 &0.01 MG tablet, OCP start with next menses/condoms. F/u prn.     Meds ordered this encounter  Medications  . Levonorgestrel-Ethinyl Estradiol (AMETHIA) 0.15-0.03 &0.01 MG tablet    Sig: Take 1 tablet by mouth daily.    Dispense:  1 Package    Refill:  2    Order Specific Question:   Supervising Provider    Answer:   Nadara MustardHARRIS, ROBERT P [id number]]      Return if symptoms worsen or fail to improve.  Clay Menser B. Emileigh Kellett, PA-C 11/08/2018 12:03 PM

## 2018-11-10 LAB — URINE CULTURE

## 2018-11-11 LAB — CERVICOVAGINAL ANCILLARY ONLY
Bacterial vaginitis: POSITIVE — AB
Chlamydia: NEGATIVE
Neisseria Gonorrhea: NEGATIVE
Trichomonas: NEGATIVE

## 2018-11-12 ENCOUNTER — Other Ambulatory Visit: Payer: Self-pay | Admitting: Obstetrics and Gynecology

## 2018-11-12 DIAGNOSIS — U071 COVID-19: Secondary | ICD-10-CM

## 2018-11-12 HISTORY — DX: COVID-19: U07.1

## 2018-11-12 MED ORDER — METRONIDAZOLE 500 MG PO TABS: 500.0000 mg | ORAL_TABLET | Freq: Two times a day (BID) | ORAL | 0 refills | Status: AC

## 2018-11-12 NOTE — Progress Notes (Signed)
Pls let pt know STD testing neg. Has BV which explains d/c. Rx eRxd. Still waiting on herpes culture.

## 2018-11-12 NOTE — Progress Notes (Signed)
Pt aware.

## 2018-11-14 ENCOUNTER — Encounter: Payer: Self-pay | Admitting: Emergency Medicine

## 2018-11-14 ENCOUNTER — Other Ambulatory Visit: Payer: Self-pay

## 2018-11-14 ENCOUNTER — Emergency Department
Admission: EM | Admit: 2018-11-14 | Discharge: 2018-11-14 | Disposition: A | Discharge status: Home / Self Care | Payer: BC Managed Care – PPO | Attending: Emergency Medicine | Admitting: Emergency Medicine

## 2018-11-14 DIAGNOSIS — E1165 Type 2 diabetes mellitus with hyperglycemia: Secondary | ICD-10-CM | POA: Insufficient documentation

## 2018-11-14 DIAGNOSIS — Z794 Long term (current) use of insulin: Secondary | ICD-10-CM | POA: Insufficient documentation

## 2018-11-14 DIAGNOSIS — R739 Hyperglycemia, unspecified: Secondary | ICD-10-CM

## 2018-11-14 DIAGNOSIS — R42 Dizziness and giddiness: Secondary | ICD-10-CM | POA: Diagnosis present

## 2018-11-14 DIAGNOSIS — E860 Dehydration: Secondary | ICD-10-CM

## 2018-11-14 LAB — CBC
HCT: 43.3 % (ref 36.0–46.0)
Hemoglobin: 14.4 g/dL (ref 12.0–15.0)
MCH: 30.1 pg (ref 26.0–34.0)
MCHC: 33.3 g/dL (ref 30.0–36.0)
MCV: 90.4 fL (ref 80.0–100.0)
Platelets: 181 10*3/uL (ref 150–400)
RBC: 4.79 MIL/uL (ref 3.87–5.11)
RDW: 12.4 % (ref 11.5–15.5)
WBC: 3.1 10*3/uL — ABNORMAL LOW (ref 4.0–10.5)
nRBC: 0 % (ref 0.0–0.2)

## 2018-11-14 LAB — BLOOD GAS, VENOUS
Acid-base deficit: 4.4 mmol/L — ABNORMAL HIGH (ref 0.0–2.0)
Bicarbonate: 21 mmol/L (ref 20.0–28.0)
FIO2: 0.21
O2 Saturation: 75.2 %
Patient temperature: 37
pCO2, Ven: 39 mmHg — ABNORMAL LOW (ref 44.0–60.0)
pH, Ven: 7.34 (ref 7.250–7.430)
pO2, Ven: 43 mmHg (ref 32.0–45.0)

## 2018-11-14 LAB — URINALYSIS, COMPLETE (UACMP) WITH MICROSCOPIC
Bilirubin Urine: NEGATIVE
Glucose, UA: >=500 mg/dL — AB
Ketones, ur: 80 mg/dL — AB
Nitrite: NEGATIVE
Protein, ur: NEGATIVE mg/dL
Specific Gravity, Urine: 1.039 — ABNORMAL HIGH (ref 1.005–1.030)
pH: 5 (ref 5.0–8.0)

## 2018-11-14 LAB — HSV NAA
HSV 1 NAA: NEGATIVE
HSV 2 NAA: POSITIVE — AB

## 2018-11-14 LAB — BASIC METABOLIC PANEL
Anion gap: 9 (ref 5–15)
BUN: 20 mg/dL (ref 6–20)
CO2: 21 mmol/L — ABNORMAL LOW (ref 22–32)
Calcium: 8.4 mg/dL — ABNORMAL LOW (ref 8.9–10.3)
Chloride: 101 mmol/L (ref 98–111)
Creatinine, Ser: 1.2 mg/dL — ABNORMAL HIGH (ref 0.44–1.00)
GFR calc Af Amer: >60 mL/min (ref >60)
GFR calc non Af Amer: >60 mL/min (ref >60)
Glucose, Bld: 370 mg/dL — ABNORMAL HIGH (ref 70–99)
Potassium: 3.9 mmol/L (ref 3.5–5.1)
Sodium: 131 mmol/L — ABNORMAL LOW (ref 135–145)

## 2018-11-14 LAB — GLUCOSE, CAPILLARY: Glucose-Capillary: 328 mg/dL — ABNORMAL HIGH (ref 70–99)

## 2018-11-14 LAB — POCT PREGNANCY, URINE: Preg Test, Ur: NEGATIVE

## 2018-11-14 MED ORDER — SODIUM CHLORIDE 0.9 % IV BOLUS
1000.0000 mL | Freq: Once | INTRAVENOUS | Status: AC
  Administered 2018-11-14: 1000 mL via INTRAVENOUS

## 2018-11-14 MED ORDER — SODIUM CHLORIDE 0.9% FLUSH: 3.0000 mL | Freq: Once | INTRAVENOUS | Status: DC

## 2018-11-14 NOTE — ED Provider Notes (Signed)
[facility] Emergency Department Provider Note   ____________________________________________   I have reviewed the triage vital signs and the nursing notes.   HISTORY  Chief Complaint Flank Pain and Dizziness   History limited by: Not Limited   HPI Laurie Ortiz is a 28 y.o. female who presents to the emergency department today because of concern for possible dehydration and DKA. The patient has felt ill for the past week. She states that she has had some back pain, dizziness and weakness. Does have history of DKA and is concerned she might have it again. Denies missing any doses of insulin. She has not had any fevers. No change in urine.  Records reviewed. Per medical record review patient has a history of DKA  Past Medical History:  Diagnosis Date  . Diabetes mellitus, type 2 (HCC)   . History of chlamydia 08/2015  . History of Papanicolaou smear of cervix 04/22/13; 08/30/15   NEG CT/GC NEG; NEG CT-POS/GC/TR NEG  . History of vaginitis   . Obesity (BMI 30.0-34.9)     Patient Active Problem List   Diagnosis Date Noted  . DKA (diabetic ketoacidoses) (HCC) 06/30/2018  . Chlamydia 09/18/2016  . Trichimoniasis 09/18/2016  . Gonorrhea 07/29/2016  . Cyst of right Bartholin's gland 07/29/2016  . Exposure to STD 07/21/2016    Past Surgical History:  Procedure Laterality Date  . WISDOM TOOTH EXTRACTION      Prior to Admission medications   Medication Sig Start Date End Date Taking? Authorizing Provider  Blood Glucose Monitoring Suppl (GLUCOCOM BLOOD GLUCOSE MONITOR) DEVI 1 each by XX route as directed. 09/19/13   [provider]  Continuous Blood Gluc Sensor (FREESTYLE LIBRE SENSOR SYSTEM) MISC  09/12/16   [provider]  Glucose Blood (COOL BLOOD GLUCOSE TEST STRIPS VI) Use to check blood sugars up to 6 times daily 01/22/15   [provider]  insulin lispro (HUMALOG KWIKPEN) 100 UNIT/ML KwikPen -Humalog 1 unit for every  10 grams of carbohydrate (with each meal and snack) -Meal time Humalog correction scale before meals (to be added to meal coverage): 141-180 mg/dL- 1 unit 161-096181-220 mg/dL- 2 units 045-409221-260 mg/dL- 3 units 811-914261-300 mg/dL- 4 units 782-956301-340 mg/dL- 5 units 213-086341-380 mg/dL- 6 units 578-469381-420 mg/dL- 7 units -Bedtime Humalog correction scale:             181-240 mg/dL-1 unit             629-528241-300 mg/dL-2 units             413-244301-360 mg/dL-3 units             010-272361-420 mg/dL-4 units  Check blood sugars 4-5 times a day.  Check first thing in the morning, before each meal and at bedtime 07/02/18   Adrian SaranMody, Sital, MD  Insulin Pen Needle (FIFTY50 PEN NEEDLES) 31G X 8 MM MISC Use as directed four times daily 01/22/15   [provider]  Levonorgestrel-Ethinyl Estradiol (AMETHIA) 0.15-0.03 &0.01 MG tablet Take 1 tablet by mouth daily. 11/08/18   Copland, Ilona SorrelAlicia B, PA-C  metroNIDAZOLE (FLAGYL) 500 MG tablet Take 1 tablet (500 mg total) by mouth 2 (two) times daily for 7 days. 11/12/18 11/19/18  Copland, Helmut MusterAlicia B, PA-C  NOVOLOG FLEXPEN 100 UNIT/ML FlexPen USE ATTATCHED SLIDING SCALE AND INJECT 4 5 TIMES DAILY 09/14/18   [provider]  ondansetron (ZOFRAN ODT) 4 MG disintegrating tablet Take 1 tablet (4 mg total) by mouth every 8 (eight) hours as needed. 07/05/18  Alfred Levins, [place], MD  simvastatin (ZOCOR) 20 MG tablet Take 20 mg by mouth at bedtime.  05/06/16   [provider]  TOUJEO SOLOSTAR 300 UNIT/ML SOPN Inject 20-25 Units into the skin at bedtime. Takes 22 at night 10:00 PM 07/02/18   Bettey Costa, MD    Allergies Penicillins and Amoxicillin  Family History  Problem Relation Age of Onset  . Hypertension Mother   . Heart disease Maternal Grandmother   . Breast cancer Paternal Grandmother        breast cancer in late 40s-died from breast cancer  . Prostate cancer Paternal Grandfather   . Dementia Paternal Grandfather   . Parkinson's disease Paternal Grandfather     Social History Social  History   Tobacco Use  . Smoking status: Never Smoker  . Smokeless tobacco: Never Used  Substance Use Topics  . Alcohol use: Yes    Comment: OCC  . Drug use: No    Review of Systems Constitutional: No fever/chills. Positive for generalized weakness. Eyes: No visual changes. ENT: No sore throat. Cardiovascular: Denies chest pain. Respiratory: Denies shortness of breath. Gastrointestinal: No abdominal pain.  No nausea, no vomiting.  No diarrhea.   Genitourinary: Negative for dysuria. Musculoskeletal: Positive for back pain. Skin: Negative for rash. Neurological: Negative for headaches, focal weakness or numbness.  ____________________________________________   PHYSICAL EXAM:  VITAL SIGNS: ED Triage Vitals  Enc Vitals Group     BP 11/14/18 0742 (!) 148/98     Pulse Rate 11/14/18 0742 (!) 104     Resp 11/14/18 0742 16     Temp 11/14/18 0742 98.2 F (36.8 C)     Temp Source 11/14/18 0742 Oral     SpO2 11/14/18 0742 97 %     Weight 11/14/18 0745 259 lb 3.2 oz (117.6 kg)     Height 11/14/18 0745 5\' 8"  (1.727 m)     Head Circumference --      Peak Flow --      Pain Score 11/14/18 0745 8   Constitutional: Alert and oriented.  Eyes: Conjunctivae are normal.  ENT      Head: Normocephalic and atraumatic.      Nose: No congestion/rhinnorhea.      Mouth/Throat: Mucous membranes are moist.      Neck: No stridor. Hematological/Lymphatic/Immunilogical: No cervical lymphadenopathy. Cardiovascular: Normal rate, regular rhythm.  No murmurs, rubs, or gallops.  Respiratory: Normal respiratory effort without tachypnea nor retractions. Breath sounds are clear and equal bilaterally. No wheezes/rales/rhonchi. Gastrointestinal: Soft and non tender. No rebound. No guarding.  Genitourinary: Deferred Musculoskeletal: Normal range of motion in all extremities. No lower extremity edema. Neurologic:  Normal speech and language. No gross focal neurologic deficits are appreciated.  Skin:  Skin  is warm, dry and intact. No rash noted. Psychiatric: Mood and affect are normal. Speech and behavior are normal. Patient exhibits appropriate insight and judgment.  ____________________________________________    LABS (pertinent positives/negatives)  BMP na 131, co2 21, glu 370, cr 1.20, anion gap 9 CBC wbc 3.1, hgb 14.4, plt 181 Upreg neg UA coudy, moderate hgb dipstick, trace leukocytes, 6-10 rbc, 11-20 wbc, rare bacteria, squamous epi 11-20 VBG pH 7.34 ____________________________________________   EKG  I, Nance Pear, attending physician, personally viewed and interpreted this EKG  EKG Time: 0820 Rate: 95 Rhythm: sinus rhythm Axis: right axis deviation Intervals: qtc 464 QRS: narrow, q waves v2 ST changes: no st elevation Impression: abnormal ekg   ____________________________________________    RADIOLOGY  None  ____________________________________________  PROCEDURES  Procedures  ____________________________________________   INITIAL IMPRESSION / ASSESSMENT AND PLAN / ED COURSE  Pertinent labs & imaging results that were available during my care of the patient were reviewed by me and considered in my medical decision making (see chart for details).   Patient presented to the emergency department because of concern for dehydration and possible DKA. While patient did have elevated blood sugar and ketones, she did not have an elevated anion gap or acidosis to suggest DKA. Creatinine was slightly elevated suggesting some dehydration. Patient was given IV fluids in the emergency department. Will discharge to follow up with primary care.  ____________________________________________   FINAL CLINICAL IMPRESSION(S) / ED DIAGNOSES  Final diagnoses:  Dehydration  Hyperglycemia     Note: This dictation was prepared with Dragon dictation. Any transcriptional errors that result from this process are unintentional     Phineas SemenGoodman, Girlie Veltri, MD 11/14/18  1027

## 2018-11-14 NOTE — Discharge Instructions (Addendum)
Please seek medical attention for any high fevers, chest pain, shortness of breath, change in behavior, persistent vomiting, bloody stool or any other new or concerning symptoms.  

## 2018-11-14 NOTE — ED Notes (Signed)
Pt states she is a diabetic and her sugars have been a "little wacky" lately due to being sick and stress- pt states she just ate before coming to the ED

## 2018-11-14 NOTE — ED Triage Notes (Signed)
Pt arrived via POV with reports of feeling dizzy, bilateral flank pain, and feeling like she may be in DKA.  Pt states sxs worse over the past week.   Pt reports increased thirst as well.

## 2018-11-16 ENCOUNTER — Telehealth: Payer: Self-pay | Admitting: Obstetrics and Gynecology

## 2018-11-16 ENCOUNTER — Other Ambulatory Visit: Payer: Self-pay

## 2018-11-16 DIAGNOSIS — Z20822 Contact with and (suspected) exposure to covid-19: Secondary | ICD-10-CM

## 2018-11-16 MED ORDER — VALACYCLOVIR HCL 500 MG PO TABS: 500.0000 mg | ORAL_TABLET | Freq: Every day | ORAL | 2 refills | Status: DC

## 2018-11-16 NOTE — Telephone Encounter (Signed)
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 Initial sx improving, new lesions developed. Discussed daily vs episodic tx. Pt prefers daily. Rx valtrex eRxd. Discussed partner testing with HSV 2 IgG prn. F/u prn.

## 2018-11-17 ENCOUNTER — Telehealth: Payer: Self-pay | Admitting: *Deleted

## 2018-11-17 LAB — NOVEL CORONAVIRUS, NAA: SARS-CoV-2, NAA: DETECTED — AB

## 2018-11-17 NOTE — Telephone Encounter (Signed)
    Pt returned call for covid results,reviewed positive covid 19 results. Reports "Feel fine, just with loss of taste, smell." Reviewed quarantine precautions; self isolate for 10 days from onset of symptoms, with 3 consecutive days fever free without fever reducing medications. Leave home for medical issues only; if must go out wear mask. Reviewed household precautions and preventive care measures, including: frequent hand-washing, wiping down of high touch areas ie: doorknobs, counter tops. Avoid touching your face, and isolate, distance from rest of household. Reviewed symptoms which warrant an ED visit. Ptverbalizes understanding. Will alert [place] [facility]

## 2018-11-19 NOTE — Telephone Encounter (Signed)
Per pt. Request, mailed COVID results to her home address.

## 2018-11-23 ENCOUNTER — Other Ambulatory Visit: Payer: Self-pay

## 2018-11-23 ENCOUNTER — Encounter: Payer: Self-pay | Admitting: Emergency Medicine

## 2018-11-23 ENCOUNTER — Emergency Department: Payer: BC Managed Care – PPO

## 2018-11-23 ENCOUNTER — Emergency Department
Admission: EM | Admit: 2018-11-23 | Discharge: 2018-11-24 | Disposition: A | Discharge status: Other Acute Inpatient Hospital | Payer: BC Managed Care – PPO | Attending: Emergency Medicine | Admitting: Emergency Medicine

## 2018-11-23 DIAGNOSIS — R079 Chest pain, unspecified: Secondary | ICD-10-CM | POA: Diagnosis present

## 2018-11-23 DIAGNOSIS — R9431 Abnormal electrocardiogram [ECG] [EKG]: Secondary | ICD-10-CM | POA: Diagnosis not present

## 2018-11-23 DIAGNOSIS — Z79899 Other long term (current) drug therapy: Secondary | ICD-10-CM | POA: Diagnosis not present

## 2018-11-23 DIAGNOSIS — E119 Type 2 diabetes mellitus without complications: Secondary | ICD-10-CM | POA: Diagnosis not present

## 2018-11-23 DIAGNOSIS — U071 COVID-19: Secondary | ICD-10-CM | POA: Diagnosis not present

## 2018-11-23 LAB — CBC
HCT: 42.3 % (ref 36.0–46.0)
Hemoglobin: 14.6 g/dL (ref 12.0–15.0)
MCH: 29.9 pg (ref 26.0–34.0)
MCHC: 34.5 g/dL (ref 30.0–36.0)
MCV: 86.7 fL (ref 80.0–100.0)
Platelets: 278 10*3/uL (ref 150–400)
RBC: 4.88 MIL/uL (ref 3.87–5.11)
RDW: 11.9 % (ref 11.5–15.5)
WBC: 4.8 10*3/uL (ref 4.0–10.5)
nRBC: 0 % (ref 0.0–0.2)

## 2018-11-23 LAB — BASIC METABOLIC PANEL
Anion gap: 15 (ref 5–15)
BUN: 14 mg/dL (ref 6–20)
CO2: 17 mmol/L — ABNORMAL LOW (ref 22–32)
Calcium: 8.6 mg/dL — ABNORMAL LOW (ref 8.9–10.3)
Chloride: 100 mmol/L (ref 98–111)
Creatinine, Ser: 0.7 mg/dL (ref 0.44–1.00)
GFR calc Af Amer: >60 mL/min (ref >60)
GFR calc non Af Amer: >60 mL/min (ref >60)
Glucose, Bld: 509 mg/dL (ref 70–99)
Potassium: 3.7 mmol/L (ref 3.5–5.1)
Sodium: 132 mmol/L — ABNORMAL LOW (ref 135–145)

## 2018-11-23 LAB — TROPONIN I (HIGH SENSITIVITY)
Troponin I (High Sensitivity): 2 ng/L (ref <18)
Troponin I (High Sensitivity): 3 ng/L (ref <18)

## 2018-11-23 LAB — GLUCOSE, CAPILLARY: Glucose-Capillary: 261 mg/dL — ABNORMAL HIGH (ref 70–99)

## 2018-11-23 LAB — SARS CORONAVIRUS 2 BY RT PCR (HOSPITAL ORDER, PERFORMED IN ~~LOC~~ HOSPITAL LAB): SARS Coronavirus 2: POSITIVE — AB

## 2018-11-23 MED ORDER — SODIUM CHLORIDE 0.9 % IV BOLUS
1000.0000 mL | Freq: Once | INTRAVENOUS | Status: AC
  Administered 2018-11-23: 1000 mL via INTRAVENOUS

## 2018-11-23 NOTE — ED Notes (Signed)
Pt signed physical transfer form due to topaz pad not working.

## 2018-11-23 NOTE — ED Provider Notes (Signed)
[facility] Emergency Department Provider Note   ____________________________________________   First MD Initiated Contact with Patient 11/23/18 1535     (approximate)  I have reviewed the triage vital signs and the nursing notes.   HISTORY  Chief Complaint Chest Pain and COVID+   HPI Laurie Ortiz is a 28 y.o. female patient was COVID +11 days ago.  She had a brief episode with no sense of smell or taste and some shortness of breath and cough.  This is resolved and she was feeling good up until yesterday evening when she got chest pain shortness of breath while moving or exercising.  Additionally her heart rates up today.  Pain is not worse with deep breathing.  Patient has a history of diabetes.  She has no fever no chills she has had a cough productive of small amounts of whitish phlegm since yesterday shortness of breath with exertion and chest pain which is tight with exertion.  Gets better when she sits still.         Past Medical History:  Diagnosis Date  . Diabetes mellitus, type 2 ([place])   . History of chlamydia 08/2015  . History of Papanicolaou smear of cervix 04/22/13; 08/30/15   NEG CT/GC NEG; NEG CT-POS/GC/TR NEG  . History of vaginitis   . Obesity (BMI 30.0-34.9)     Patient Active Problem List   Diagnosis Date Noted  . DKA (diabetic ketoacidoses) ([place]) 06/30/2018  . Chlamydia 09/18/2016  . Trichimoniasis 09/18/2016  . Gonorrhea 07/29/2016  . Cyst of right Bartholin's gland 07/29/2016  . Exposure to STD 07/21/2016    Past Surgical History:  Procedure Laterality Date  . WISDOM TOOTH EXTRACTION      Prior to Admission medications   Medication Sig Start Date End Date Taking? Authorizing Provider  Ascorbic Acid (VITAMIN C) 1000 MG tablet Take 2,000 mg by mouth daily.   Yes [provider]  insulin lispro (HUMALOG KWIKPEN) 100 UNIT/ML KwikPen -Humalog 1 unit for every 10 grams of carbohydrate (with each meal and snack)  -Meal time Humalog correction scale before meals (to be added to meal coverage): 141-180 mg/dL- 1 unit 181-220 mg/dL- 2 units 221-260 mg/dL- 3 units 261-300 mg/dL- 4 units 301-340 mg/dL- 5 units 341-380 mg/dL- 6 units 381-420 mg/dL- 7 units -Bedtime Humalog correction scale:             181-240 mg/dL-1 unit             241-300 mg/dL-2 units             301-360 mg/dL-3 units             361-420 mg/dL-4 units  Check blood sugars 4-5 times a day.  Check first thing in the morning, before each meal and at bedtime 07/02/18  Yes Mody, Ulice Bold, MD  Levonorgestrel-Ethinyl Estradiol (AMETHIA) 0.15-0.03 &0.01 MG tablet Take 1 tablet by mouth daily. 1/74/94  Yes Copland, Elmo Putt B, PA-C  ondansetron (ZOFRAN ODT) 4 MG disintegrating tablet Take 1 tablet (4 mg total) by mouth every 8 (eight) hours as needed. 07/05/18  Yes Veronese, [place], MD  simvastatin (ZOCOR) 20 MG tablet Take 20 mg by mouth at bedtime.  05/06/16  Yes [provider]  TOUJEO SOLOSTAR 300 UNIT/ML SOPN Inject 20-25 Units into the skin at bedtime. Takes 22 at night 10:00 PM Patient taking differently: Inject 20 Units into the skin at bedtime.  07/02/18  Yes Bettey Costa, MD  valACYclovir (VALTREX) 500 MG tablet  Take 1 tablet (500 mg total) by mouth daily. 11/16/18  Yes Copland, Alicia B, PA-C  zinc gluconate 50 MG tablet Take 50 mg by mouth daily.   Yes [provider]    Allergies Penicillins and Amoxicillin  Family History  Problem Relation Age of Onset  . Hypertension Mother   . Heart disease Maternal Grandmother   . Breast cancer Paternal Grandmother        breast cancer in late 40s-died from breast cancer  . Prostate cancer Paternal Grandfather   . Dementia Paternal Grandfather   . Parkinson's disease Paternal Grandfather     Social History Social History   Tobacco Use  . Smoking status: Never Smoker  . Smokeless tobacco: Never Used  Substance Use Topics  . Alcohol use: Yes    Comment: OCC  . Drug  use: No    Review of Systems  Constitutional: No fever/chills Eyes: No visual changes. ENT: No sore throat. Cardiovascular:  chest pain. Respiratory:  shortness of breath. Gastrointestinal: No abdominal pain.  No nausea, no vomiting.  No diarrhea.  No constipation. Genitourinary: Negative for dysuria. Musculoskeletal: Negative for back pain. Skin: Negative for rash. Neurological: Negative for headaches, focal weakness    ____________________________________________   PHYSICAL EXAM:  VITAL SIGNS: ED Triage Vitals  Enc Vitals Group     BP 11/23/18 1352 140/86     Pulse Rate 11/23/18 1352 (!) 114     Resp 11/23/18 1352 20     Temp 11/23/18 1352 98.4 F (36.9 C)     Temp Source 11/23/18 1352 Oral     SpO2 11/23/18 1352 98 %     Weight 11/23/18 1354 259 lb 0.7 oz (117.5 kg)     Height 11/23/18 1354 5\' 8"  (1.727 m)     Head Circumference --      Peak Flow --      Pain Score 11/23/18 1354 4     Pain Loc --      Pain Edu? --      Excl. in GC? --     Constitutional: Alert and oriented. Well appearing and in no acute distress. Eyes: Conjunctivae are normal. PERRL. EOMI. Head: Atraumatic. Nose: No congestion/rhinnorhea. Mouth/Throat: Mucous membranes are moist.  Oropharynx non-erythematous. Neck: No stridor.  Cardiovascular: Normal rate, regular rhythm. Grossly normal heart sounds.  Good peripheral circulation. Respiratory: Normal respiratory effort.  No retractions. Lungs CTAB. Gastrointestinal: Soft and nontender. No distention. No abdominal bruits. No CVA tenderness. Musculoskeletal: No lower extremity tenderness nor edema.   Neurologic:  Normal speech and language. No gross focal neurologic deficits are appreciated. . Skin:  Skin is warm, dry and intact. No rash noted. Psychiatric: Mood and affect are normal. Speech and behavior are normal.  ____________________________________________   LABS (all labs ordered are listed, but only abnormal results are displayed)   Labs Reviewed  SARS CORONAVIRUS 2 (HOSPITAL ORDER, PERFORMED IN [facility]) - Abnormal; Notable for the following components:      Result Value   SARS Coronavirus 2 POSITIVE (*)    All other components within normal limits  BASIC METABOLIC PANEL - Abnormal; Notable for the following components:   Sodium 132 (*)    CO2 17 (*)    Glucose, Bld 509 (*)    Calcium 8.6 (*)    All other components within normal limits  BLOOD GAS, VENOUS - Abnormal; Notable for the following components:   pCO2, Ven 43 (*)    All other components within  normal limits  GLUCOSE, CAPILLARY - Abnormal; Notable for the following components:   Glucose-Capillary 261 (*)    All other components within normal limits  CBC  POC URINE PREG, ED  CBG MONITORING, ED  TROPONIN I (HIGH SENSITIVITY)  TROPONIN I (HIGH SENSITIVITY)   ____________________________________________  EKG  EKG read interpreted by me shows sinus tachycardia rate of 112 normal axis there are flipped T's in 3 and F and flattening in lead II and in V5 and V6 there was an EKG done on August 2 which probably had limb leads reversed that showed right axis with flipped T's in 1 and L some flattening in 2 but normal T's in 3 and F and normal T's in V5 and V6 there was a 30 EKG done 19 October 2013 which looks more like today's EKG with normal axis flipped T's in 3 some flattening and F but normal T's in V5 and V6.  I believe that the EKG on the second probably did have limb lead reversal. Second EKG done today shows normal sinus rhythm rate of 88 normal axis flattening in 2 and F and T wave inversion in lead III also flattening in V5 and V6 is actually V345 and 6. ____________________________________________  RADIOLOGY  ED MD interpretation: Chest x-ray read by radiology reviewed by me is normal  Official radiology report(s): Dg Chest 2 View  Result Date: 11/23/2018 CLINICAL DATA:  Chest pain, shortness of breath. COVID-19 positive test 11 days  prior EXAM: CHEST - 2 VIEW COMPARISON:  Chest radiograph 06/30/2018 FINDINGS: No consolidation, features of edema, pneumothorax, or effusion. Pulmonary vascularity is normally distributed. The cardiomediastinal contours are unremarkable. No acute osseous or soft tissue abnormality. Multilevel degenerative changes are present in the imaged portions of the spine. IMPRESSION: No active cardiopulmonary disease. Electronically Signed   By: Kreg ShropshirePrice  DeHay M.D.   On: 11/23/2018 14:27    ____________________________________________   PROCEDURES  Procedure(s) performed (including Critical Care):  Procedures   ____________________________________________   INITIAL IMPRESSION / ASSESSMENT AND PLAN / ED COURSE  Patient with chest pain and shortness of breath with exertion some EKG changes negative troponin I discussed this with our hospitalist who agrees that we should admit her and watch her overnight but since she is COVID positive still he wants to send her to [facility].  I discussed the patient with Dr. and on call for cardiologist cardiology and he also agrees that we should watch the patient overnight and [facility].  Then I discussed the patient with [facility] hospitalist who feels the [facility] needs to [facility] because they do not have cardiology availability [facility] so now I am waiting for the medical [facility]ist at [facility]. ----------------------------------------- 9:05 PM on 11/23/2018 -----------------------------------------  [facility] for bed now             ____________________________________________   FINAL CLINICAL IMPRESSION(S) / ED DIAGNOSES  Final diagnoses:  Abnormal EKG  Exertional chest pain     ED Discharge Orders    None       Note:  This document was prepared using Dragon voice recognition software and may include unintentional dictation errors.     Arnaldo NatalMalinda, Paul F, MD 11/23/18 2105

## 2018-11-23 NOTE — ED Notes (Signed)
Pt assisted to bathroom with no issue.

## 2018-11-23 NOTE — ED Triage Notes (Signed)
Patient reports centralized chest pain since this morning. States she woke up with and had a hard time catching her breath. Patient reports testing positive for COVID 11 days ago. Patient states that she had very mild symptoms for the last few days until waking up this morning feeling bad.

## 2018-11-24 ENCOUNTER — Observation Stay (HOSPITAL_COMMUNITY)
Admission: AD | Admit: 2018-11-24 | Discharge: 2018-11-24 | Disposition: A | Discharge status: Home / Self Care | Payer: BC Managed Care – PPO | Source: Other Acute Inpatient Hospital | Attending: Internal Medicine | Admitting: Internal Medicine

## 2018-11-24 ENCOUNTER — Encounter (HOSPITAL_COMMUNITY): Payer: Self-pay | Admitting: Family Medicine

## 2018-11-24 ENCOUNTER — Observation Stay (HOSPITAL_COMMUNITY): Payer: BC Managed Care – PPO

## 2018-11-24 DIAGNOSIS — R079 Chest pain, unspecified: Secondary | ICD-10-CM | POA: Diagnosis present

## 2018-11-24 DIAGNOSIS — Z79899 Other long term (current) drug therapy: Secondary | ICD-10-CM | POA: Insufficient documentation

## 2018-11-24 DIAGNOSIS — E669 Obesity, unspecified: Secondary | ICD-10-CM | POA: Insufficient documentation

## 2018-11-24 DIAGNOSIS — R072 Precordial pain: Secondary | ICD-10-CM

## 2018-11-24 DIAGNOSIS — Z793 Long term (current) use of hormonal contraceptives: Secondary | ICD-10-CM | POA: Insufficient documentation

## 2018-11-24 DIAGNOSIS — Z794 Long term (current) use of insulin: Secondary | ICD-10-CM | POA: Diagnosis not present

## 2018-11-24 DIAGNOSIS — E1165 Type 2 diabetes mellitus with hyperglycemia: Secondary | ICD-10-CM | POA: Diagnosis present

## 2018-11-24 DIAGNOSIS — Z880 Allergy status to penicillin: Secondary | ICD-10-CM | POA: Diagnosis not present

## 2018-11-24 DIAGNOSIS — U071 COVID-19: Secondary | ICD-10-CM | POA: Diagnosis not present

## 2018-11-24 DIAGNOSIS — R0789 Other chest pain: Principal | ICD-10-CM | POA: Insufficient documentation

## 2018-11-24 DIAGNOSIS — Z6830 Body mass index (BMI) 30.0-30.9, adult: Secondary | ICD-10-CM | POA: Diagnosis not present

## 2018-11-24 LAB — TROPONIN I (HIGH SENSITIVITY): Troponin I (High Sensitivity): <2 ng/L (ref <18)

## 2018-11-24 LAB — GLUCOSE, CAPILLARY
Glucose-Capillary: 272 mg/dL — ABNORMAL HIGH (ref 70–99)
Glucose-Capillary: 265 mg/dL — ABNORMAL HIGH (ref 70–99)

## 2018-11-24 LAB — MRSA PCR SCREENING: MRSA by PCR: NEGATIVE

## 2018-11-24 MED ORDER — ASPIRIN 300 MG RE SUPP
300.0000 mg | RECTAL | Status: AC
  Filled 2018-11-24: qty 1

## 2018-11-24 MED ORDER — INSULIN ASPART 100 UNIT/ML ~~LOC~~ SOLN
0.0000 [IU] | Freq: Every day | SUBCUTANEOUS | Status: DC
  Administered 2018-11-24: 3 [IU] via SUBCUTANEOUS

## 2018-11-24 MED ORDER — ENOXAPARIN SODIUM 40 MG/0.4ML ~~LOC~~ SOLN
40.0000 mg | SUBCUTANEOUS | Status: DC
  Administered 2018-11-24: 40 mg via SUBCUTANEOUS
  Filled 2018-11-24: qty 0.4

## 2018-11-24 MED ORDER — SIMVASTATIN 20 MG PO TABS: 20.0000 mg | ORAL_TABLET | Freq: Every day | ORAL | Status: DC

## 2018-11-24 MED ORDER — VALACYCLOVIR HCL 500 MG PO TABS
500.0000 mg | ORAL_TABLET | Freq: Every day | ORAL | Status: DC
  Administered 2018-11-24: 500 mg via ORAL
  Filled 2018-11-24: qty 1

## 2018-11-24 MED ORDER — ASPIRIN 81 MG PO CHEW
324.0000 mg | CHEWABLE_TABLET | ORAL | Status: AC
  Administered 2018-11-24: 324 mg via ORAL
  Filled 2018-11-24: qty 4

## 2018-11-24 MED ORDER — ENSURE ENLIVE PO LIQD
237.0000 mL | Freq: Two times a day (BID) | ORAL | Status: DC
  Administered 2018-11-24: 237 mL via ORAL

## 2018-11-24 MED ORDER — ASPIRIN EC 81 MG PO TBEC: 81.0000 mg | DELAYED_RELEASE_TABLET | Freq: Every day | ORAL | Status: DC

## 2018-11-24 MED ORDER — ACETAMINOPHEN 325 MG PO TABS: 650.0000 mg | ORAL_TABLET | ORAL | Status: DC | PRN

## 2018-11-24 MED ORDER — ONDANSETRON HCL 4 MG/2ML IJ SOLN: 4.0000 mg | Freq: Four times a day (QID) | INTRAMUSCULAR | Status: DC | PRN

## 2018-11-24 MED ORDER — INSULIN GLARGINE 100 UNIT/ML ~~LOC~~ SOLN
12.0000 [IU] | Freq: Every day | SUBCUTANEOUS | Status: DC
  Administered 2018-11-24: 12 [IU] via SUBCUTANEOUS
  Filled 2018-11-24 (×2): qty 0.12

## 2018-11-24 MED ORDER — IOHEXOL 350 MG/ML SOLN
100.0000 mL | Freq: Once | INTRAVENOUS | Status: AC | PRN
  Administered 2018-11-24: 56 mL via INTRAVENOUS

## 2018-11-24 MED ORDER — NITROGLYCERIN 0.4 MG SL SUBL: 0.4000 mg | SUBLINGUAL_TABLET | SUBLINGUAL | Status: DC | PRN

## 2018-11-24 MED ORDER — INSULIN ASPART 100 UNIT/ML ~~LOC~~ SOLN
0.0000 [IU] | Freq: Three times a day (TID) | SUBCUTANEOUS | Status: DC
  Administered 2018-11-24: 5 [IU] via SUBCUTANEOUS

## 2018-11-24 NOTE — Discharge Instructions (Signed)
Person Under Monitoring Name: Laurie Ortiz  Location: [place] [address]   Infection Prevention Recommendations for Individuals Confirmed to have, or Being Evaluated for, 2019 Novel Coronavirus (COVID-19) Infection Who Receive Care at Home  Individuals who are confirmed to have, or are being evaluated for, COVID-19 should follow the prevention steps below until a healthcare provider or local or state health department says they can return to normal activities.  Stay home except to get medical care You should restrict activities outside your home, except for getting medical care. Do not go to work, school, or public areas, and do not use public transportation or taxis.  Call ahead before visiting your doctor Before your medical appointment, call the healthcare provider and tell them that you have, or are being evaluated for, COVID-19 infection. This will help the healthcare providers office take steps to keep other people from getting infected. Ask your healthcare provider to call the local or state health department.  Monitor your symptoms Seek prompt medical attention if your illness is worsening (e.g., difficulty breathing). Before going to your medical appointment, call the healthcare provider and tell them that you have, or are being evaluated for, COVID-19 infection. Ask your healthcare provider to call the local or state health department.  Wear a facemask You should wear a facemask that covers your nose and mouth when you are in the same room with other people and when you visit a healthcare provider. People who live with or visit you should also wear a facemask while they are in the same room with you.  Separate yourself from other people in your home As much as possible, you should stay in a different room from other people in your home. Also, you should use a separate bathroom, if available.  Avoid sharing household items You should not  share dishes, drinking glasses, cups, eating utensils, towels, bedding, or other items with other people in your home. After using these items, you should wash them thoroughly with soap and water.  Cover your coughs and sneezes Cover your mouth and nose with a tissue when you cough or sneeze, or you can cough or sneeze into your sleeve. Throw used tissues in a lined trash can, and immediately wash your hands with soap and water for at least 20 seconds or use an alcohol-based hand rub.  Wash your [facility] your hands often and thoroughly with soap and water for at least 20 seconds. You can use an alcohol-based hand sanitizer if soap and water are not available and if your hands are not visibly dirty. Avoid touching your eyes, nose, and mouth with unwashed hands.   Prevention Steps for Caregivers and Household Members of Individuals Confirmed to have, or Being Evaluated for, COVID-19 Infection Being Cared for in the Home  If you live with, or provide care at home for, a person confirmed to have, or being evaluated for, COVID-19 infection please follow these guidelines to prevent infection:  Follow healthcare providers instructions Make sure that you understand and can help the patient follow any healthcare provider instructions for all care.  Provide for the patients basic needs You should help the patient with basic needs in the home and provide support for getting groceries, prescriptions, and other personal needs.  Monitor the patients symptoms If they are getting sicker, call his or her medical provider and tell them that the patient has, or is being evaluated for, COVID-19 infection. This will help the healthcare providers  office take steps to keep other people from getting infected. Ask the healthcare provider to call the local or state health department.  Limit the number of people who have contact with the patient  If possible, have only one caregiver for the  patient.  Other household members should stay in another home or place of residence. If this is not possible, they should stay  in another room, or be separated from the patient as much as possible. Use a separate bathroom, if available.  Restrict visitors who do not have an essential need to be in the home.  Keep older adults, very young children, and other sick people away from the patient Keep older adults, very young children, and those who have compromised immune systems or chronic health conditions away from the patient. This includes people with chronic heart, lung, or kidney conditions, diabetes, and cancer.  Ensure good ventilation Make sure that shared spaces in the home have good air flow, such as from an air conditioner or an opened window, weather permitting.  Wash your hands often  Wash your hands often and thoroughly with soap and water for at least 20 seconds. You can use an alcohol based hand sanitizer if soap and water are not available and if your hands are not visibly dirty.  Avoid touching your eyes, nose, and mouth with unwashed hands.  Use disposable paper towels to dry your hands. If not available, use dedicated cloth towels and replace them when they become wet.  Wear a facemask and gloves  Wear a disposable facemask at all times in the room and gloves when you touch or have contact with the patients blood, body fluids, and/or secretions or excretions, such as sweat, saliva, sputum, nasal mucus, vomit, urine, or feces.  Ensure the mask fits over your nose and mouth tightly, and do not touch it during use.  Throw out disposable facemasks and gloves after using them. Do not reuse.  Wash your hands immediately after removing your facemask and gloves.  If your personal clothing becomes contaminated, carefully remove clothing and launder. Wash your hands after handling contaminated clothing.  Place all used disposable facemasks, gloves, and other waste in a lined  container before disposing them with other household waste.  Remove gloves and wash your hands immediately after handling these items.  Do not share dishes, glasses, or other household items with the patient  Avoid sharing household items. You should not share dishes, drinking glasses, cups, eating utensils, towels, bedding, or other items with a patient who is confirmed to have, or being evaluated for, COVID-19 infection.  After the person uses these items, you should wash them thoroughly with soap and water.  Wash laundry thoroughly  Immediately remove and wash clothes or bedding that have blood, body fluids, and/or secretions or excretions, such as sweat, saliva, sputum, nasal mucus, vomit, urine, or feces, on them.  Wear gloves when handling laundry from the patient.  Read and follow directions on labels of laundry or clothing items and detergent. In general, wash and dry with the warmest temperatures recommended on the label.  Clean all areas the individual has used often  Clean all touchable surfaces, such as counters, tabletops, doorknobs, bathroom fixtures, toilets, phones, keyboards, tablets, and bedside tables, every day. Also, clean any surfaces that may have blood, body fluids, and/or secretions or excretions on them.  Wear gloves when cleaning surfaces the patient has come in contact with.  Use a diluted bleach solution (e.g., dilute bleach with 1  part bleach and 10 parts water) or a household disinfectant with a label that says EPA-registered for coronaviruses. To make a bleach solution at home, add 1 tablespoon of bleach to 1 quart (4 cups) of water. For a larger supply, add  cup of bleach to 1 gallon (16 cups) of water.  Read labels of cleaning products and follow recommendations provided on product labels. Labels contain instructions for safe and effective use of the cleaning product including precautions you should take when applying the product, such as wearing gloves or  eye protection and making sure you have good ventilation during use of the product.  Remove gloves and wash hands immediately after cleaning.  Monitor yourself for signs and symptoms of illness Caregivers and household members are considered close contacts, should monitor their health, and will be asked to limit movement outside of the home to the extent possible. Follow the monitoring steps for close contacts listed on the symptom monitoring form.   ? If you have additional questions, contact your local health department or call the epidemiologist on call at [phone] (available 24/7). ? This guidance is subject to change. For the most up-to-date guidance from [facility], please refer to their website: [email or web address]

## 2018-11-24 NOTE — Discharge Summary (Addendum)
Physician Discharge Summary  Laurie Ortiz GNF:[id number] DOB: 09-01-1990 DOA: 11/24/2018  PCP: Patient, No Pcp Per  Admit date: 11/24/2018 Discharge date: 11/24/2018  Admitted From: Home Disposition:  Home  Recommendations for Outpatient Follow-up:  1. Follow up with PCP in 1 week 2. COVID home isolation guideline provided in discharge instructions   Discharge Condition: Stable CODE STATUS: Full  Diet recommendation: Carb modified diet   Brief/Interim Summary: From H&P by Dr. Myna Hidalgo: "Laurie Ortiz is a 28 y.o. female with medical history significant for poorly controlled insulin-dependent diabetes mellitus and positive COVID-19 testing on 11/16/2018, now presenting to the emergency department for evaluation of chest pain and shortness of breath.  Patient had been experiencing loss of taste and smell almost 2 weeks ago, was positive for COVID-19 at that time, symptoms seem to be improving, but she developed increased shortness of breath with some chest discomfort the evening of 11/22/2018, and has been experiencing chest pain and exertional dyspnea since then.  She reports some mild right calf tenderness but has not noted any swelling.  She denies history of heart disease.  She denies history of DVT or PE but reports a positive family history of these in her grandmother.  [facility] ED Course: Upon arrival to the ED, patient is found to be afebrile, saturating mid 90s on room air, slightly tachypneic, tachycardic in the 110s, and with stable blood pressure.  EKG features sinus tachycardia with rate 112 and nonspecific T wave abnormalities.  Chest x-ray is negative for acute cardiopulmonary disease.  Chemistry panel is notable for a glucose of 509, bicarbonate 17, and normal anion gap.  CBC is unremarkable.  High-sensitivity troponin is in the normal range x2 more than 2 hours apart.  ED physician discussed the case with cardiology who recommended observing the patient  in the hospital."  Patient evaluated this morning, states that her chest pain has now improved.  No concerns for shortness of breath this morning.  CTA chest was negative for pulmonary embolism.  Repeat troponin remained negative x3.  Vital signs remained stable.  Patient remained on room air.  Patient discharged home in stable condition.  Discharge Diagnoses:  Principal Problem:   Chest pain Active Problems:   Uncontrolled diabetes mellitus with hyperglycemia ([place])   COVID-19 virus infection   Discharge Instructions  Discharge Instructions    Call MD for:  difficulty breathing, headache or visual disturbances   Complete by: As directed    Call MD for:  extreme fatigue   Complete by: As directed    Call MD for:  persistant dizziness or light-headedness   Complete by: As directed    Call MD for:  persistant nausea and vomiting   Complete by: As directed    Call MD for:  severe uncontrolled pain   Complete by: As directed    Call MD for:  temperature >100.4   Complete by: As directed    Diet Carb Modified   Complete by: As directed    Discharge instructions   Complete by: As directed    You were cared for by a hospitalist during your hospital stay. If you have any questions about your discharge medications or the care you received while you were in the hospital after you are discharged, you can call the unit and ask to speak with the hospitalist on call if the hospitalist that took care of you is not available. Once you are discharged, your primary care physician will handle any further  medical issues. Please note that NO REFILLS for any discharge medications will be authorized once you are discharged, as it is imperative that you return to your primary care physician (or establish a relationship with a primary care physician if you do not have one) for your aftercare needs so that they can reassess your need for medications and monitor your lab values.   Increase activity slowly    Complete by: As directed      Allergies as of 11/24/2018      Reactions   Penicillins    Doesn't take any medications ending in "cillin"   Amoxicillin Hives, Rash   (rash 09/1996)      Medication List    TAKE these medications   insulin lispro 100 UNIT/ML KwikPen Commonly known as: HumaLOG KwikPen -Humalog 1 unit for every 10 grams of carbohydrate (with each meal and snack) -Meal time Humalog correction scale before meals (to be added to meal coverage): 141-180 mg/dL- 1 unit 098-119181-220 mg/dL- 2 units 147-829221-260 mg/dL- 3 units 562-130261-300 mg/dL- 4 units 865-784301-340 mg/dL- 5 units 696-295341-380 mg/dL- 6 units 284-132381-420 mg/dL- 7 units -Bedtime Humalog correction scale:             181-240 mg/dL-1 unit             440-102241-300 mg/dL-2 units             725-366301-360 mg/dL-3 units             440-347361-420 mg/dL-4 units  Check blood sugars 4-5 times a day.  Check first thing in the morning, before each meal and at bedtime   Levonorgestrel-Ethinyl Estradiol 0.15-0.03 &0.01 MG tablet Commonly known as: AMETHIA Take 1 tablet by mouth daily.   ondansetron 4 MG disintegrating tablet Commonly known as: Zofran ODT Take 1 tablet (4 mg total) by mouth every 8 (eight) hours as needed.   simvastatin 20 MG tablet Commonly known as: ZOCOR Take 20 mg by mouth at bedtime.   Toujeo SoloStar 300 UNIT/ML Sopn Generic drug: Insulin Glargine (1 Unit Dial) Inject 20-25 Units into the skin at bedtime. Takes 22 at night 10:00 PM What changed:   how much to take  additional instructions   valACYclovir 500 MG tablet Commonly known as: VALTREX Take 1 tablet (500 mg total) by mouth daily.   vitamin C 1000 MG tablet Take 2,000 mg by mouth daily.   zinc gluconate 50 MG tablet Take 50 mg by mouth daily.       Allergies  Allergen Reactions  . Penicillins     Doesn't take any medications ending in "cillin"  . Amoxicillin Hives and Rash    (rash 09/1996)    Consultations:  None    Procedures/Studies: Dg Chest 2  View  Result Date: 11/23/2018 CLINICAL DATA:  Chest pain, shortness of breath. COVID-19 positive test 11 days prior EXAM: CHEST - 2 VIEW COMPARISON:  Chest radiograph 06/30/2018 FINDINGS: No consolidation, features of edema, pneumothorax, or effusion. Pulmonary vascularity is normally distributed. The cardiomediastinal contours are unremarkable. No acute osseous or soft tissue abnormality. Multilevel degenerative changes are present in the imaged portions of the spine. IMPRESSION: No active cardiopulmonary disease. Electronically Signed   By: Kreg ShropshirePrice  DeHay M.D.   On: 11/23/2018 14:27   Ct Angio Chest Pe W Or Wo Contrast  Result Date: 11/24/2018 CLINICAL DATA:  Shortness of breath and chest pain. COVID-19 positive. EXAM: CT ANGIOGRAPHY CHEST WITH CONTRAST TECHNIQUE: Multidetector CT imaging of the chest was performed using the standard  protocol during bolus administration of intravenous contrast. Multiplanar CT image reconstructions and MIPs were obtained to evaluate the vascular anatomy. CONTRAST:  56mL OMNIPAQUE IOHEXOL 350 MG/ML SOLN COMPARISON:  None. FINDINGS: Cardiovascular: Satisfactory opacification of the pulmonary arteries to the segmental level. No evidence of pulmonary embolism. Normal heart size. No pericardial effusion. Mediastinum/Nodes: Negative for adenopathy or inflammation Lungs/Pleura: Mild subpleural ground-glass density on both sides correlating with history of COVID-19 positivity. Central airways are clear. No edema, effusion, or pneumothorax. Upper Abdomen: Negative Musculoskeletal: Negative Review of the MIP images confirms the above findings. IMPRESSION: 1. Mild bilateral ground-glass airspace disease correlating with COVID-19 positivity. 2. Negative for pulmonary embolism. Electronically Signed   By: Marnee Spring M.D.   On: 11/24/2018 05:55      Discharge Exam: Vitals:   11/24/18 0114 11/24/18 0800  BP: (!) 131/98 125/66  Pulse: 86 81  Resp: 19 16  Temp: 98 F (36.7 C)  98.2 F (36.8 C)  SpO2: 100% 99%    General: Pt is alert, awake, not in acute distress Cardiovascular: RRR, S1/S2 +, no edema Respiratory: CTA bilaterally, no wheezing, no rhonchi, no respiratory distress, no conversational dyspnea  Abdominal: Soft, NT, ND, bowel sounds + Extremities: no edema, no cyanosis Psych: Normal mood and affect, stable judgement and insight     The results of significant diagnostics from this hospitalization (including imaging, microbiology, ancillary and laboratory) are listed below for reference.     Microbiology: Recent Results (from the past 240 hour(s))  Novel Coronavirus, NAA ([facility])     Status: Abnormal   Collection Time: 11/16/18 12:00 AM   Specimen: Oropharyngeal(OP) collection in vial transport medium   OROPHARYNGEA  TESTING  Result Value Ref Range Status   SARS-CoV-2, NAA Detected (A) Not Detected Final    Comment: This test was developed and its performance characteristics determined by [employer]. This test has not been FDA cleared or approved. This test has been authorized by FDA under an Emergency Use Authorization (EUA). This test is only authorized for the duration of time the declaration that circumstances exist justifying the authorization of the emergency use of in vitro diagnostic tests for detection of SARS-CoV-2 virus and/or diagnosis of COVID-19 infection under section 564(b)(1) of the Act, 21 U.S.C. 409WJX-9(J)(4), unless the authorization is terminated or revoked sooner. When diagnostic testing is negative, the possibility of a false negative result should be considered in the context of a patient's recent exposures and the presence of clinical signs and symptoms consistent with COVID-19. An individual without symptoms of COVID-19 and who is not shedding SARS-CoV-2 virus would expect to have a negative (not detected) result in this assay.   SARS Coronavirus 2 [facility] order, Performed in [facility] hospital lab)  Nasopharyngeal Nasopharyngeal Swab     Status: Abnormal   Collection Time: 11/23/18  6:43 PM   Specimen: Nasopharyngeal Swab  Result Value Ref Range Status   SARS Coronavirus 2 POSITIVE (A) NEGATIVE Final    Comment: RESULT CALLED TO, READ BACK BY AND VERIFIED WITH: DEE MCCLAIN ON 11/23/18 AT 2006 [facility] (NOTE) If result is NEGATIVE SARS-CoV-2 target nucleic acids are NOT DETECTED. The SARS-CoV-2 RNA is generally detectable in upper and lower  respiratory specimens during the acute phase of infection. The lowest  concentration of SARS-CoV-2 viral copies this assay can detect is 250  copies / mL. A negative result does not preclude SARS-CoV-2 infection  and should not be used as the sole basis for treatment or other  patient management  decisions.  A negative result may occur with  improper specimen collection / handling, submission of specimen other  than nasopharyngeal swab, presence of viral mutation(s) within the  areas targeted by this assay, and inadequate number of viral copies  (<250 copies / mL). A negative result must be combined with clinical  observations, patient history, and epidemiological information. If result is POSITIVE SARS-CoV-2 target nucleic acids are DETECTED. Th e SARS-CoV-2 RNA is generally detectable in upper and lower  respiratory specimens during the acute phase of infection.  Positive  results are indicative of active infection with SARS-CoV-2.  Clinical  correlation with patient history and other diagnostic information is  necessary to determine patient infection status.  Positive results do  not rule out bacterial infection or co-infection with other viruses. If result is PRESUMPTIVE POSTIVE SARS-CoV-2 nucleic acids MAY BE PRESENT.   A presumptive positive result was obtained on the submitted specimen  and confirmed on repeat testing.  While 2019 novel coronavirus  (SARS-CoV-2) nucleic acids may be present in the submitted sample  additional confirmatory  testing may be necessary for epidemiological  and / or clinical management purposes  to differentiate between  SARS-CoV-2 and other Sarbecovirus currently known to infect humans.  If clinically indicated additional testing with an alternate test  methodology [phone]([id number]) is  advised. The SARS-CoV-2 RNA is generally  detectable in upper and lower respiratory specimens during the acute  phase of infection. The expected result is Negative. Fact Sheet for Patients:  BoilerBrush.com.cyhttps://www.fda.gov/media/136312/download Fact Sheet for Healthcare Providers: https://pope.com/https://www.fda.gov/media/136313/download This test is not yet approved or cleared by the [place] FDA and has been authorized for detection and/or diagnosis of SARS-CoV-2 by FDA under an Emergency Use Authorization (EUA).  This EUA will remain in effect (meaning this test can be used) for the duration of the COVID-19 declaration under Section 564(b)(1) of the Act, 21 U.S.C. section 360bbb-3(b)(1), unless the authorization is terminated or revoked sooner. Performed at [facility], [address] [place], [place] [address]   MRSA PCR Screening     Status: None   Collection Time: 11/24/18  1:20 AM   Specimen: Nasopharyngeal  Result Value Ref Range Status   MRSA by PCR NEGATIVE NEGATIVE Final    Comment:        The GeneXpert MRSA Assay (FDA approved for NASAL specimens only), is one component of a comprehensive MRSA colonization surveillance program. It is not intended to diagnose MRSA infection nor to guide or monitor treatment for MRSA infections. Performed at [facility], [address] [place], [place] [address]      Labs: BNP (last 3 results) No results for input(s): BNP in the last 8760 hours. Basic Metabolic Panel: Recent Labs  Lab 11/23/18 1401  NA 132*  K 3.7  CL 100  CO2 17*  GLUCOSE 509*  BUN 14  CREATININE 0.70  CALCIUM 8.6*   Liver Function Tests: No results for input(s): AST, ALT, ALKPHOS,  BILITOT, PROT, ALBUMIN in the last 168 hours. No results for input(s): LIPASE, AMYLASE in the last 168 hours. No results for input(s): AMMONIA in the last 168 hours. CBC: Recent Labs  Lab 11/23/18 1401  WBC 4.8  HGB 14.6  HCT 42.3  MCV 86.7  PLT 278   Cardiac Enzymes: No results for input(s): CKTOTAL, CKMB, CKMBINDEX, TROPONINI in the last 168 hours. BNP: Invalid input(s): POCBNP CBG: Recent Labs  Lab 11/23/18 1827 11/24/18 0131 11/24/18 0846  GLUCAP 261* 272* 265*   D-Dimer  No results for input(s): DDIMER in the last 72 hours. Hgb A1c No results for input(s): HGBA1C in the last 72 hours. Lipid Profile No results for input(s): CHOL, HDL, LDLCALC, TRIG, CHOLHDL, LDLDIRECT in the last 72 hours. Thyroid function studies No results for input(s): TSH, T4TOTAL, T3FREE, THYROIDAB in the last 72 hours.  Invalid input(s): FREET3 Anemia work up No results for input(s): VITAMINB12, FOLATE, FERRITIN, TIBC, IRON, RETICCTPCT in the last 72 hours. Urinalysis    Component Value Date/Time   COLORURINE YELLOW (A) 11/14/2018 0751   APPEARANCEUR CLOUDY (A) 11/14/2018 0751   APPEARANCEUR Hazy 05/13/2014 1323   LABSPEC 1.039 (H) 11/14/2018 0751   LABSPEC 1.008 05/13/2014 1323   PHURINE 5.0 11/14/2018 0751   GLUCOSEU >=500 (A) 11/14/2018 0751   GLUCOSEU Negative 05/13/2014 1323   HGBUR MODERATE (A) 11/14/2018 0751   BILIRUBINUR NEGATIVE 11/14/2018 0751   BILIRUBINUR neg 11/08/2018 1156   BILIRUBINUR Negative 05/13/2014 1323   KETONESUR 80 (A) 11/14/2018 0751   PROTEINUR NEGATIVE 11/14/2018 0751   UROBILINOGEN 0.2 08/25/2018 1529   NITRITE NEGATIVE 11/14/2018 0751   LEUKOCYTESUR TRACE (A) 11/14/2018 0751   LEUKOCYTESUR 2+ 05/13/2014 1323   Sepsis Labs Invalid input(s): PROCALCITONIN,  WBC,  LACTICIDVEN Microbiology Recent Results (from the past 240 hour(s))  Novel Coronavirus, NAA ([facility])     Status: Abnormal   Collection Time: 11/16/18 12:00 AM   Specimen:  Oropharyngeal(OP) collection in vial transport medium   OROPHARYNGEA  TESTING  Result Value Ref Range Status   SARS-CoV-2, NAA Detected (A) Not Detected Final    Comment: This test was developed and its performance characteristics determined by [employer]. This test has not been FDA cleared or approved. This test has been authorized by FDA under an Emergency Use Authorization (EUA). This test is only authorized for the duration of time the declaration that circumstances exist justifying the authorization of the emergency use of in vitro diagnostic tests for detection of SARS-CoV-2 virus and/or diagnosis of COVID-19 infection under section 564(b)(1) of the Act, 21 U.S.C. 161WRU-0(A)(5), unless the authorization is terminated or revoked sooner. When diagnostic testing is negative, the possibility of a false negative result should be considered in the context of a patient's recent exposures and the presence of clinical signs and symptoms consistent with COVID-19. An individual without symptoms of COVID-19 and who is not shedding SARS-CoV-2 virus would expect to have a negative (not detected) result in this assay.   SARS Coronavirus 2 [facility] order, Performed in [facility] hospital lab) Nasopharyngeal Nasopharyngeal Swab     Status: Abnormal   Collection Time: 11/23/18  6:43 PM   Specimen: Nasopharyngeal Swab  Result Value Ref Range Status   SARS Coronavirus 2 POSITIVE (A) NEGATIVE Final    Comment: RESULT CALLED TO, READ BACK BY AND VERIFIED WITH: DEE MCCLAIN ON 11/23/18 AT 2006 [facility] (NOTE) If result is NEGATIVE SARS-CoV-2 target nucleic acids are NOT DETECTED. The SARS-CoV-2 RNA is generally detectable in upper and lower  respiratory specimens during the acute phase of infection. The lowest  concentration of SARS-CoV-2 viral copies this assay can detect is 250  copies / mL. A negative result does not preclude SARS-CoV-2 infection  and should not be used as the sole basis  for treatment or other  patient management decisions.  A negative result may occur with  improper specimen collection / handling, submission of specimen other  than nasopharyngeal swab, presence of viral mutation(s) within the  areas targeted by this assay, and inadequate number of  viral copies  (<250 copies / mL). A negative result must be combined with clinical  observations, patient history, and epidemiological information. If result is POSITIVE SARS-CoV-2 target nucleic acids are DETECTED. Th e SARS-CoV-2 RNA is generally detectable in upper and lower  respiratory specimens during the acute phase of infection.  Positive  results are indicative of active infection with SARS-CoV-2.  Clinical  correlation with patient history and other diagnostic information is  necessary to determine patient infection status.  Positive results do  not rule out bacterial infection or co-infection with other viruses. If result is PRESUMPTIVE POSTIVE SARS-CoV-2 nucleic acids MAY BE PRESENT.   A presumptive positive result was obtained on the submitted specimen  and confirmed on repeat testing.  While 2019 novel coronavirus  (SARS-CoV-2) nucleic acids may be present in the submitted sample  additional confirmatory testing may be necessary for epidemiological  and / or clinical management purposes  to differentiate between  SARS-CoV-2 and other Sarbecovirus currently known to infect humans.  If clinically indicated additional testing with an alternate test  methodology [id number]) is  advised. The SARS-CoV-2 RNA is generally  detectable in upper and lower respiratory specimens during the acute  phase of infection. The expected result is Negative. Fact Sheet for Patients:  BoilerBrush.com.cyhttps://www.fda.gov/media/136312/download Fact Sheet for Healthcare Providers: https://pope.com/https://www.fda.gov/media/136313/download This test is not yet approved or cleared by the [place] FDA and has been authorized for detection and/or  diagnosis of SARS-CoV-2 by FDA under an Emergency Use Authorization (EUA).  This EUA will remain in effect (meaning this test can be used) for the duration of the COVID-19 declaration under Section 564(b)(1) of the Act, 21 U.S.C. section 360bbb-3(b)(1), unless the authorization is terminated or revoked sooner. Performed at [facility], [address] [place], [place] [address]   MRSA PCR Screening     Status: None   Collection Time: 11/24/18  1:20 AM   Specimen: Nasopharyngeal  Result Value Ref Range Status   MRSA by PCR NEGATIVE NEGATIVE Final    Comment:        The GeneXpert MRSA Assay (FDA approved for NASAL specimens only), is one component of a comprehensive MRSA colonization surveillance program. It is not intended to diagnose MRSA infection nor to guide or monitor treatment for MRSA infections. Performed at [facility], [address] [place], [place] [address]      Patient was seen and examined on the day of discharge and was found to be in stable condition. Time coordinating discharge: 40 minutes including assessment and coordination of care, as well as examination of the patient.   SIGNED:  Noralee StainJennifer Tatiyana Foucher, DO [facility] [email or web address] 11/24/2018, 10:09 AM

## 2018-11-24 NOTE — H&P (Signed)
History and Physical    Laurie PellegriniChristine N Lawhorne AVW:[id number] DOB: 1990/04/18 DOA: 11/24/2018  PCP: Patient, No Pcp Per   Patient coming from: Home   Chief Complaint: Chest pain, SOB   HPI: Laurie PellegriniChristine N Strohman is a 28 y.o. female with medical history significant for poorly controlled insulin-dependent diabetes mellitus and positive COVID-19 testing on 11/16/2018, now presenting to the emergency department for evaluation of chest pain and shortness of breath.  Patient had been experiencing loss of taste and smell almost 2 weeks ago, was positive for COVID-19 at that time, symptoms seem to be improving, but she developed increased shortness of breath with some chest discomfort the evening of 11/22/2018, and has been experiencing chest pain and exertional dyspnea since then.  She reports some mild right calf tenderness but has not noted any swelling.  She denies history of heart disease.  She denies history of DVT or PE but reports a positive family history of these in her grandmother.  [facility] ED Course: Upon arrival to the ED, patient is found to be afebrile, saturating mid 90s on room air, slightly tachypneic, tachycardic in the 110s, and with stable blood pressure.  EKG features sinus tachycardia with rate 112 and nonspecific T wave abnormalities.  Chest x-ray is negative for acute cardiopulmonary disease.  Chemistry panel is notable for a glucose of 509, bicarbonate 17, and normal anion gap.  CBC is unremarkable.  High-sensitivity troponin is in the normal range x2 more than 2 hours apart.  ED physician discussed the case with cardiology who recommended observing the patient in the hospital.   Review of Systems:  All other systems reviewed and apart from HPI, are negative.  Past Medical History:  Diagnosis Date  . Diabetes mellitus, type 2 (HCC)   . History of chlamydia 08/2015  . History of Papanicolaou smear of cervix 04/22/13; 08/30/15   NEG CT/GC NEG; NEG CT-POS/GC/TR NEG   . History of vaginitis   . Obesity (BMI 30.0-34.9)     Past Surgical History:  Procedure Laterality Date  . WISDOM TOOTH EXTRACTION       reports that she has never smoked. She has never used smokeless tobacco. She reports current alcohol use. She reports that she does not use drugs.  Allergies  Allergen Reactions  . Penicillins     Doesn't take any medications ending in "cillin"  . Amoxicillin Hives and Rash    (rash 09/1996)    Family History  Problem Relation Age of Onset  . Hypertension Mother   . Heart disease Maternal Grandmother   . Breast cancer Paternal Grandmother        breast cancer in late 40s-died from breast cancer  . Prostate cancer Paternal Grandfather   . Dementia Paternal Grandfather   . Parkinson's disease Paternal Grandfather      Prior to Admission medications   Medication Sig Start Date End Date Taking? Authorizing Provider  Ascorbic Acid (VITAMIN C) 1000 MG tablet Take 2,000 mg by mouth daily.    [provider]  insulin lispro (HUMALOG KWIKPEN) 100 UNIT/ML KwikPen -Humalog 1 unit for every 10 grams of carbohydrate (with each meal and snack) -Meal time Humalog correction scale before meals (to be added to meal coverage): 141-180 mg/dL- 1 unit 829-562181-220 mg/dL- 2 units 130-865221-260 mg/dL- 3 units 784-696261-300 mg/dL- 4 units 295-284301-340 mg/dL- 5 units 132-440341-380 mg/dL- 6 units 102-725381-420 mg/dL- 7 units -Bedtime Humalog correction scale:  181-240 mg/dL-1 unit             284-132241-300 mg/dL-2 units             440-102301-360 mg/dL-3 units             725-366361-420 mg/dL-4 units  Check blood sugars 4-5 times a day.  Check first thing in the morning, before each meal and at bedtime 07/02/18   Adrian SaranMody, Sital, MD  Levonorgestrel-Ethinyl Estradiol (AMETHIA) 0.15-0.03 &0.01 MG tablet Take 1 tablet by mouth daily. 11/08/18   Copland, Helmut MusterAlicia B, PA-C  ondansetron (ZOFRAN ODT) 4 MG disintegrating tablet Take 1 tablet (4 mg total) by mouth every 8 (eight) hours as needed. 07/05/18    Nita SickleVeronese, [place], MD  simvastatin (ZOCOR) 20 MG tablet Take 20 mg by mouth at bedtime.  05/06/16   [provider]  TOUJEO SOLOSTAR 300 UNIT/ML SOPN Inject 20-25 Units into the skin at bedtime. Takes 22 at night 10:00 PM Patient taking differently: Inject 20 Units into the skin at bedtime.  07/02/18   Adrian SaranMody, Sital, MD  valACYclovir (VALTREX) 500 MG tablet Take 1 tablet (500 mg total) by mouth daily. 11/16/18   Copland, Alicia B, PA-C  zinc gluconate 50 MG tablet Take 50 mg by mouth daily.    [provider]    Physical Exam: Vitals:   11/24/18 0114  BP: (!) 131/98  Pulse: 86  Resp: 19  Temp: 98 F (36.7 C)  TempSrc: Oral  SpO2: 100%  Weight: 113.5 kg  Height: 5\' 7"  (1.702 m)    Constitutional: NAD, calm  Eyes: PERTLA, lids and conjunctivae normal ENMT: Mucous membranes are moist. Posterior pharynx clear of any exudate or lesions.   Neck: normal, supple, no masses, no thyromegaly Respiratory: no wheezing, no crackles. No accessory muscle use.  Cardiovascular: S1 & S2 heard, regular rate and rhythm. No extremity edema.   Abdomen: No distension, no tenderness, soft. Bowel sounds normal.  Musculoskeletal: no clubbing / cyanosis. No joint deformity upper and lower extremities.    Skin: no significant rashes, lesions, ulcers. Warm, dry, well-perfused. Neurologic: CN 2-12 grossly intact. Sensation intact. Strength 5/5 in all 4 limbs.  Psychiatric: Alert and oriented x 3. Pleasant, cooperative.    Labs on Admission: I have personally reviewed following labs and imaging studies  CBC: Recent Labs  Lab 11/23/18 1401  WBC 4.8  HGB 14.6  HCT 42.3  MCV 86.7  PLT 278   Basic Metabolic Panel: Recent Labs  Lab 11/23/18 1401  NA 132*  K 3.7  CL 100  CO2 17*  GLUCOSE 509*  BUN 14  CREATININE 0.70  CALCIUM 8.6*   GFR: Estimated Creatinine Clearance: 135 mL/min (by C-G formula based on SCr of 0.7 mg/dL). Liver Function Tests: No results for input(s): AST,  ALT, ALKPHOS, BILITOT, PROT, ALBUMIN in the last 168 hours. No results for input(s): LIPASE, AMYLASE in the last 168 hours. No results for input(s): AMMONIA in the last 168 hours. Coagulation Profile: No results for input(s): INR, PROTIME in the last 168 hours. Cardiac Enzymes: No results for input(s): CKTOTAL, CKMB, CKMBINDEX, TROPONINI in the last 168 hours. BNP (last 3 results) No results for input(s): PROBNP in the last 8760 hours. HbA1C: No results for input(s): HGBA1C in the last 72 hours. CBG: Recent Labs  Lab 11/23/18 1827 11/24/18 0131  GLUCAP 261* 272*   Lipid Profile: No results for input(s): CHOL, HDL, LDLCALC, TRIG, CHOLHDL, LDLDIRECT in the last 72 hours. Thyroid Function Tests: No results  for input(s): TSH, T4TOTAL, FREET4, T3FREE, THYROIDAB in the last 72 hours. Anemia Panel: No results for input(s): VITAMINB12, FOLATE, FERRITIN, TIBC, IRON, RETICCTPCT in the last 72 hours. Urine analysis:    Component Value Date/Time   COLORURINE YELLOW (A) 11/14/2018 0751   APPEARANCEUR CLOUDY (A) 11/14/2018 0751   APPEARANCEUR Hazy 05/13/2014 1323   LABSPEC 1.039 (H) 11/14/2018 0751   LABSPEC 1.008 05/13/2014 1323   PHURINE 5.0 11/14/2018 0751   GLUCOSEU >=500 (A) 11/14/2018 0751   GLUCOSEU Negative 05/13/2014 1323   HGBUR MODERATE (A) 11/14/2018 0751   BILIRUBINUR NEGATIVE 11/14/2018 0751   BILIRUBINUR neg 11/08/2018 1156   BILIRUBINUR Negative 05/13/2014 1323   KETONESUR 80 (A) 11/14/2018 0751   PROTEINUR NEGATIVE 11/14/2018 0751   UROBILINOGEN 0.2 08/25/2018 1529   NITRITE NEGATIVE 11/14/2018 0751   LEUKOCYTESUR TRACE (A) 11/14/2018 0751   LEUKOCYTESUR 2+ 05/13/2014 1323   Sepsis Labs: @LABRCNTIP (procalcitonin:4,lacticidven:4) ) Recent Results (from the past 240 hour(s))  Novel Coronavirus, NAA ([facility])     Status: Abnormal   Collection Time: 11/16/18 12:00 AM   Specimen: Oropharyngeal(OP) collection in vial transport medium   OROPHARYNGEA  TESTING  Result  Value Ref Range Status   SARS-CoV-2, NAA Detected (A) Not Detected Final    Comment: This test was developed and its performance characteristics determined by [employer]. This test has not been FDA cleared or approved. This test has been authorized by FDA under an Emergency Use Authorization (EUA). This test is only authorized for the duration of time the declaration that circumstances exist justifying the authorization of the emergency use of in vitro diagnostic tests for detection of SARS-CoV-2 virus and/or diagnosis of COVID-19 infection under section 564(b)(1) of the Act, 21 U.S.C. 725DGU-4(Q)(0), unless the authorization is terminated or revoked sooner. When diagnostic testing is negative, the possibility of a false negative result should be considered in the context of a patient's recent exposures and the presence of clinical signs and symptoms consistent with COVID-19. An individual without symptoms of COVID-19 and who is not shedding SARS-CoV-2 virus would expect to have a negative (not detected) result in this assay.   SARS Coronavirus 2 [facility] order, Performed in [facility] hospital lab) Nasopharyngeal Nasopharyngeal Swab     Status: Abnormal   Collection Time: 11/23/18  6:43 PM   Specimen: Nasopharyngeal Swab  Result Value Ref Range Status   SARS Coronavirus 2 POSITIVE (A) NEGATIVE Final    Comment: RESULT CALLED TO, READ BACK BY AND VERIFIED WITH: DEE MCCLAIN ON 11/23/18 AT 2006 [facility] (NOTE) If result is NEGATIVE SARS-CoV-2 target nucleic acids are NOT DETECTED. The SARS-CoV-2 RNA is generally detectable in upper and lower  respiratory specimens during the acute phase of infection. The lowest  concentration of SARS-CoV-2 viral copies this assay can detect is 250  copies / mL. A negative result does not preclude SARS-CoV-2 infection  and should not be used as the sole basis for treatment or other  patient management decisions.  A negative result may occur with   improper specimen collection / handling, submission of specimen other  than nasopharyngeal swab, presence of viral mutation(s) within the  areas targeted by this assay, and inadequate number of viral copies  (<250 copies / mL). A negative result must be combined with clinical  observations, patient history, and epidemiological information. If result is POSITIVE SARS-CoV-2 target nucleic acids are DETECTED. Th e SARS-CoV-2 RNA is generally detectable in upper and lower  respiratory specimens during the acute phase of infection.  Positive  results are indicative of active infection with SARS-CoV-2.  Clinical  correlation with patient history and other diagnostic information is  necessary to determine patient infection status.  Positive results do  not rule out bacterial infection or co-infection with other viruses. If result is PRESUMPTIVE POSTIVE SARS-CoV-2 nucleic acids MAY BE PRESENT.   A presumptive positive result was obtained on the submitted specimen  and confirmed on repeat testing.  While 2019 novel coronavirus  (SARS-CoV-2) nucleic acids may be present in the submitted sample  additional confirmatory testing may be necessary for epidemiological  and / or clinical management purposes  to differentiate between  SARS-CoV-2 and other Sarbecovirus currently known to infect humans.  If clinically indicated additional testing with an alternate test  methodology [id number]) is  advised. The SARS-CoV-2 RNA is generally  detectable in upper and lower respiratory specimens during the acute  phase of infection. The expected result is Negative. Fact Sheet for Patients:  BoilerBrush.com.cyhttps://www.fda.gov/media/136312/download Fact Sheet for Healthcare Providers: https://pope.com/https://www.fda.gov/media/136313/download This test is not yet approved or cleared by the [place] FDA and has been authorized for detection and/or diagnosis of SARS-CoV-2 by FDA under an Emergency Use Authorization (EUA).  This EUA will  remain in effect (meaning this test can be used) for the duration of the COVID-19 declaration under Section 564(b)(1) of the Act, 21 U.S.C. section 360bbb-3(b)(1), unless the authorization is terminated or revoked sooner. Performed at [facility], [address] [place], [place] [address]      Radiological Exams on Admission: Dg Chest 2 View  Result Date: 11/23/2018 CLINICAL DATA:  Chest pain, shortness of breath. COVID-19 positive test 11 days prior EXAM: CHEST - 2 VIEW COMPARISON:  Chest radiograph 06/30/2018 FINDINGS: No consolidation, features of edema, pneumothorax, or effusion. Pulmonary vascularity is normally distributed. The cardiomediastinal contours are unremarkable. No acute osseous or soft tissue abnormality. Multilevel degenerative changes are present in the imaged portions of the spine. IMPRESSION: No active cardiopulmonary disease. Electronically Signed   By: Kreg ShropshirePrice  DeHay M.D.   On: 11/23/2018 14:27    EKG: Independently reviewed. Sinus tachycardia, rate 112, non-specific T-wave abnormalities.   Assessment/Plan   1. Chest pain  - Presents with chest pain and DOE since evening of 8/10, is positive for COVID-19 with clear CXR, EKG with non-specific T-wave abnormalities, and high-sensitivity troponin wnl x2 >2 hrs apart  - With recent right calf tenderness, concern for hypercoagulability d/t COVID-19 infection, and tachycardia, CTA chest is ordered  - Continue cardiac monitoring, check CTA chest, continue supportive care    2. COVID-19 infection  - Seen for loss of taste and smell on 8/4 and was positive for COVID-19 then, had felt better prior to developing CP and SOB evening of 8/10, has clear CXR, is only slightly tachypneic, and not hypoxic  - CTA chest is ordered as above  - Continue supportive care, airborne and contact precautions    3. Insulin-dependent DM  - A1c ws 9.9% in March 2020  - Managed with Toujeo and Humalog at home  - Check CBG's and  continue insulin with Lantus and Novolog while in hospital     PPE: CAPR, gown, gloves  DVT prophylaxis: Lovenox  Code Status: Full  Family Communication: Discussed with patient  Consults called: ED physician discussed with cardiology  Admission status: Observation     Briscoe Deutscherimothy S Mialee Weyman, MD [facility] Pager [phone]  If 7PM-7AM, please contact night-coverage [email or web address] Password [email or web address]  11/24/2018, 1:57 AM

## 2018-11-24 NOTE — Progress Notes (Signed)
Called by Radiology and notified that Pt needs 20g or bigger for CTA. Currently only has a 22g.   [id number] This RN attempted IV and unsuccessful, IV team consult placed.   0406 IV team successfully placed US guided 20g. Radiology called and notified of access, was told transport being sent for patient.

## 2018-11-26 LAB — BLOOD GAS, VENOUS
Acid-base deficit: 0.6 mmol/L (ref 0.0–2.0)
Bicarbonate: 24.9 mmol/L (ref 20.0–28.0)
O2 Saturation: 40.4 %
Patient temperature: 37
pCO2, Ven: 43 mmHg — ABNORMAL LOW (ref 44.0–60.0)
pH, Ven: 7.37 (ref 7.250–7.430)

## 2018-11-29 ENCOUNTER — Other Ambulatory Visit: Payer: Self-pay

## 2018-11-29 DIAGNOSIS — Z20822 Contact with and (suspected) exposure to covid-19: Secondary | ICD-10-CM

## 2018-12-01 LAB — NOVEL CORONAVIRUS, NAA: SARS-CoV-2, NAA: NOT DETECTED

## 2018-12-22 ENCOUNTER — Encounter: Payer: Self-pay | Admitting: *Deleted

## 2018-12-22 ENCOUNTER — Other Ambulatory Visit: Payer: Self-pay

## 2018-12-23 ENCOUNTER — Encounter: Payer: Self-pay | Admitting: Anesthesiology

## 2018-12-28 ENCOUNTER — Other Ambulatory Visit: Payer: Self-pay

## 2018-12-28 ENCOUNTER — Other Ambulatory Visit
Admission: RE | Admit: 2018-12-28 | Discharge: 2018-12-28 | Disposition: A | Discharge status: Home / Self Care | Payer: BC Managed Care – PPO | Source: Ambulatory Visit | Attending: Unknown Physician Specialty | Admitting: Unknown Physician Specialty

## 2018-12-28 NOTE — Progress Notes (Addendum)
Last covid test performed was 11/23/2018 which was positive. Following [place] policy the patient was not retested today as it is within the 90 day window for retesting positive covids. Message left with Dr. Reggy Eye office.

## 2018-12-31 ENCOUNTER — Ambulatory Visit
Admission: RE | Admit: 2018-12-31 | Payer: BC Managed Care – PPO | Source: Home / Self Care | Admitting: Unknown Physician Specialty

## 2018-12-31 HISTORY — DX: Family history of other specified conditions: Z84.89

## 2018-12-31 HISTORY — DX: Pure hypercholesterolemia, unspecified: E78.00

## 2018-12-31 HISTORY — DX: Other complications of anesthesia, initial encounter: T88.59XA

## 2018-12-31 SURGERY — SEPTOPLASTY, NOSE
Anesthesia: General

## 2019-01-15 ENCOUNTER — Emergency Department
Admission: EM | Admit: 2019-01-15 | Discharge: 2019-01-16 | Disposition: A | Discharge status: Home / Self Care | Payer: BC Managed Care – PPO | Attending: Student | Admitting: Student

## 2019-01-15 ENCOUNTER — Other Ambulatory Visit: Payer: Self-pay

## 2019-01-15 DIAGNOSIS — E119 Type 2 diabetes mellitus without complications: Secondary | ICD-10-CM | POA: Insufficient documentation

## 2019-01-15 DIAGNOSIS — R509 Fever, unspecified: Secondary | ICD-10-CM | POA: Diagnosis not present

## 2019-01-15 DIAGNOSIS — L03311 Cellulitis of abdominal wall: Secondary | ICD-10-CM | POA: Insufficient documentation

## 2019-01-15 DIAGNOSIS — L02211 Cutaneous abscess of abdominal wall: Secondary | ICD-10-CM | POA: Diagnosis not present

## 2019-01-15 DIAGNOSIS — Z794 Long term (current) use of insulin: Secondary | ICD-10-CM | POA: Diagnosis not present

## 2019-01-15 DIAGNOSIS — L539 Erythematous condition, unspecified: Secondary | ICD-10-CM | POA: Diagnosis present

## 2019-01-15 DIAGNOSIS — L0291 Cutaneous abscess, unspecified: Secondary | ICD-10-CM

## 2019-01-15 LAB — COMPREHENSIVE METABOLIC PANEL
ALT: 16 U/L (ref 0–44)
AST: 16 U/L (ref 15–41)
Albumin: 3.7 g/dL (ref 3.5–5.0)
Alkaline Phosphatase: 46 U/L (ref 38–126)
Anion gap: 11 (ref 5–15)
BUN: 13 mg/dL (ref 6–20)
CO2: 18 mmol/L — ABNORMAL LOW (ref 22–32)
Calcium: 8.6 mg/dL — ABNORMAL LOW (ref 8.9–10.3)
Chloride: 104 mmol/L (ref 98–111)
Creatinine, Ser: 0.57 mg/dL (ref 0.44–1.00)
GFR calc Af Amer: >60 mL/min (ref >60)
GFR calc non Af Amer: >60 mL/min (ref >60)
Glucose, Bld: 227 mg/dL — ABNORMAL HIGH (ref 70–99)
Potassium: 3 mmol/L — ABNORMAL LOW (ref 3.5–5.1)
Sodium: 133 mmol/L — ABNORMAL LOW (ref 135–145)
Total Bilirubin: 0.4 mg/dL (ref 0.3–1.2)
Total Protein: 7.5 g/dL (ref 6.5–8.1)

## 2019-01-15 LAB — CBC WITH DIFFERENTIAL/PLATELET
Abs Immature Granulocytes: 0.03 10*3/uL (ref 0.00–0.07)
Basophils Absolute: 0 10*3/uL (ref 0.0–0.1)
Basophils Relative: 0 %
Eosinophils Absolute: 0.1 10*3/uL (ref 0.0–0.5)
Eosinophils Relative: 2 %
HCT: 39.9 % (ref 36.0–46.0)
Hemoglobin: 13.9 g/dL (ref 12.0–15.0)
Immature Granulocytes: 0 %
Lymphocytes Relative: 24 %
Lymphs Abs: 1.7 10*3/uL (ref 0.7–4.0)
MCH: 30.6 pg (ref 26.0–34.0)
MCHC: 34.8 g/dL (ref 30.0–36.0)
MCV: 87.9 fL (ref 80.0–100.0)
Monocytes Absolute: 0.5 10*3/uL (ref 0.1–1.0)
Monocytes Relative: 7 %
Neutro Abs: 4.8 10*3/uL (ref 1.7–7.7)
Neutrophils Relative %: 67 %
Platelets: 252 10*3/uL (ref 150–400)
RBC: 4.54 MIL/uL (ref 3.87–5.11)
RDW: 12.6 % (ref 11.5–15.5)
WBC: 7.1 10*3/uL (ref 4.0–10.5)
nRBC: 0 % (ref 0.0–0.2)

## 2019-01-15 LAB — LACTIC ACID, PLASMA: Lactic Acid, Venous: 1.7 mmol/L (ref 0.5–1.9)

## 2019-01-15 MED ORDER — LIDOCAINE-EPINEPHRINE 2 %-1:100000 IJ SOLN
20.0000 mL | Freq: Once | INTRAMUSCULAR | Status: AC
  Administered 2019-01-15: 20 mL
  Filled 2019-01-15: qty 1

## 2019-01-15 MED ORDER — CEPHALEXIN 500 MG PO CAPS: 500.0000 mg | ORAL_CAPSULE | Freq: Four times a day (QID) | ORAL | 0 refills | Status: AC

## 2019-01-15 MED ORDER — SODIUM CHLORIDE 0.9 % IV BOLUS
1000.0000 mL | Freq: Once | INTRAVENOUS | Status: AC
  Administered 2019-01-15: 1000 mL via INTRAVENOUS

## 2019-01-15 MED ORDER — VANCOMYCIN HCL IN DEXTROSE 1-5 GM/200ML-% IV SOLN
1000.0000 mg | Freq: Once | INTRAVENOUS | Status: AC
  Administered 2019-01-15: 1000 mg via INTRAVENOUS
  Filled 2019-01-15: qty 200

## 2019-01-15 NOTE — ED Notes (Signed)
Pt sitting in bed speaking with this RN in NAD, redness to right lower abdomen that started 4 days ago. Pt A&Ox4. Pt reports pain is better when sitting up. Prescribed zithromax? And has taken 4 doses of this medication.

## 2019-01-15 NOTE — ED Triage Notes (Signed)
Pt is diabetic. Pt states she injects insulin to abd wall. Pt states she began to have right lower abd wall skin swelling, redness three days ago. Pt states area is getting worse. Pt with redness noted and yellow drainage to abd wall. Pt states fever at home treated with tylenol of 101.0.

## 2019-01-15 NOTE — ED Notes (Signed)
Report to jessica, rn

## 2019-01-15 NOTE — Discharge Instructions (Addendum)
Please leave the packing in place for 48 hours. After this, get in the shower and let warm water wash over the packing and remove/pull it out.   Please have a recheck in 48 hours as well to make sure you are still improving.  You can do this in the walk-in clinic, urgent care, or can return here.  Continue taking the Bactrim. Please also take the Keflex we have sent to the pharmacy. Continue Tylenol and ibuprofen as needed for pain control.  Return to the emergency department for any new or worsening symptoms.

## 2019-01-15 NOTE — ED Provider Notes (Signed)
[facility] Emergency Department Provider Note  ____________________________________________   First MD Initiated Contact with Patient 01/15/19 2202     (approximate)  I have reviewed the triage vital signs and the nursing notes.  History  Chief Complaint Fever and skin redness    HPI Laurie Ortiz is a 28 y.o. female with history of obesity, who presents emergency department for abdominal wall cellulitis/abscess.  Patient states a few days ago she developed a localized area of redness and swelling on her right mid abdomen, near where she gave herself an insulin injection.  [facility] and was started on Bactrim.  She has had a few doses of this.  She states today she has had an intermittent fever, and feels like the redness is spreading, and therefore presented to the emergency department for further evaluation.  She states the area is starting to have a small amount of puslike drainage from it. She denies any nausea or vomiting.     Past Medical Hx Past Medical History:  Diagnosis Date  . Complication of anesthesia    slow to wake after wisdom teeth extraction  . COVID-19 11/12/2018   tested (+) 11/16/18,  (-) 12/01/18  . Diabetes mellitus, type 2 (HCC)   . Family history of adverse reaction to anesthesia    Mother - slow to wake  . History of chlamydia 08/2015  . History of Papanicolaou smear of cervix 04/22/13; 08/30/15   NEG CT/GC NEG; NEG CT-POS/GC/TR NEG  . History of vaginitis   . Hypercholesteremia   . Obesity (BMI 30.0-34.9)     Problem List Patient Active Problem List   Diagnosis Date Noted  . Chest pain 11/24/2018  . Uncontrolled diabetes mellitus with hyperglycemia (HCC) 11/24/2018  . COVID-19 virus infection 11/24/2018  . DKA (diabetic ketoacidoses) (HCC) 06/30/2018  . Chlamydia 09/18/2016  . Trichimoniasis 09/18/2016  . Gonorrhea 07/29/2016  . Cyst of right Bartholin's gland 07/29/2016   . Exposure to STD 07/21/2016    Past Surgical Hx Past Surgical History:  Procedure Laterality Date  . VAGINA SURGERY     age 28 or [age]  . WISDOM TOOTH EXTRACTION      Medications Prior to Admission medications   Medication Sig Start Date End Date Taking? Authorizing Provider  insulin lispro (HUMALOG KWIKPEN) 100 UNIT/ML KwikPen -Humalog 1 unit for every 10 grams of carbohydrate (with each meal and snack) -Meal time Humalog correction scale before meals (to be added to meal coverage): 141-180 mg/dL- 1 unit 161-096181-220 mg/dL- 2 units 045-409221-260 mg/dL- 3 units 811-914261-300 mg/dL- 4 units 782-956301-340 mg/dL- 5 units 213-086341-380 mg/dL- 6 units 578-469381-420 mg/dL- 7 units -Bedtime Humalog correction scale:             181-240 mg/dL-1 unit             629-528241-300 mg/dL-2 units             413-244301-360 mg/dL-3 units             010-272361-420 mg/dL-4 units  Check blood sugars 4-5 times a day.  Check first thing in the morning, before each meal and at bedtime 07/02/18   Adrian SaranMody, Sital, MD  Levonorgestrel-Ethinyl Estradiol (AMETHIA) 0.15-0.03 &0.01 MG tablet Take 1 tablet by mouth daily. 11/08/18   Copland, Ilona SorrelAlicia B, PA-C  simvastatin (ZOCOR) 20 MG tablet Take 20 mg by mouth at bedtime.  05/06/16   [provider]  TOUJEO SOLOSTAR 300 UNIT/ML SOPN  Inject 20-25 Units into the skin at bedtime. Takes 22 at night 10:00 PM Patient taking differently: Inject 20 Units into the skin at bedtime.  07/02/18   Bettey Costa, MD  valACYclovir (VALTREX) 500 MG tablet Take 1 tablet (500 mg total) by mouth daily. 0/4/54   Copland, Deirdre Evener, PA-C    Allergies Penicillins and Amoxicillin  Family Hx Family History  Problem Relation Age of Onset  . Hypertension Mother   . Heart disease Maternal Grandmother   . Breast cancer Paternal Grandmother        breast cancer in late 40s-died from breast cancer  . Prostate cancer Paternal Grandfather   . Dementia Paternal Grandfather   . Parkinson's disease Paternal Grandfather     Social Hx  Social History   Tobacco Use  . Smoking status: Never Smoker  . Smokeless tobacco: Never Used  Substance Use Topics  . Alcohol use: Not Currently    Comment: OCC  . Drug use: No     Review of Systems  Constitutional: Negative for fever, chills. Eyes: Negative for visual changes. ENT: Negative for sore throat. Cardiovascular: Negative for chest pain. Respiratory: Negative for shortness of breath. Gastrointestinal: Negative for nausea, vomiting.  Genitourinary: Negative for dysuria. Musculoskeletal: Negative for leg swelling. Skin: + cellulitis, abscess Neurological: Negative for for headaches.   Physical Exam  Vital Signs: ED Triage Vitals [01/15/19 2130]  Enc Vitals Group     BP (!) 138/102     Pulse Rate (!) 134     Resp 20     Temp 98.4 F (36.9 C)     Temp Source Oral     SpO2 98 %     Weight 255 lb (115.7 kg)     Height 5\' 8"  (1.727 m)     Head Circumference      Peak Flow      Pain Score 3     Pain Loc      Pain Edu?      Excl. in [place]?     Constitutional: Alert and oriented.  Head: Normocephalic. Atraumatic. Eyes: Conjunctivae clear. Sclera anicteric. Nose: No congestion. No rhinorrhea. Mouth/Throat: Mucous membranes are moist.  Neck: No stridor.   Cardiovascular: Tachycardic on arrival, improved after fluids on recheck to 90s. Extremities well perfused. Respiratory: Normal respiratory effort.  Lungs CTAB. Gastrointestinal: Soft. Non-tender. Non-distended. See skin below.  Musculoskeletal: No lower extremity edema. No deformities. Neurologic:  Normal speech and language. No gross focal neurologic deficits are appreciated.  Skin: R mid abdominal wall: 3 x 3 area of erythema associated with underlying induration and fluctuance concerning for abscess, with surrounding area of erythema and warmth consistent with surrounding cellulitis Psychiatric: Mood and affect are appropriate for situation.  EKG  N/A   Radiology  N/A   Procedures   Procedure(s) performed (including critical care):  Marland KitchenMarland KitchenIncision and Drainage  Date/Time: 01/16/2019 1:13 AM Performed by: Lilia Pro., MD Authorized by: Lilia Pro., MD   Consent:    Consent obtained:  Verbal   Consent given by:  Patient   Risks discussed:  Pain, incomplete drainage, bleeding and infection Location:    Type:  Abscess   Size:  3x3   Location: R mid abdominal wall. Pre-procedure details:    Skin preparation:  Betadine Anesthesia (see MAR for exact dosages):    Anesthesia method:  Local infiltration Procedure type:    Complexity:  Complex Procedure details:    Incision types:  Stab incision   Scalpel  blade:  11   Wound management:  Probed and deloculated and irrigated with saline   Drainage:  Purulent   Drainage amount:  Copious   Packing materials:  1/4 in iodoform gauze Post-procedure details:    Patient tolerance of procedure:  Tolerated well, no immediate complications     Initial Impression / Assessment and Plan / ED Course  28 y.o. female who presents to the ED for an abdominal wall cellulitis with likely small associated abscess, as above.  Labs initiated in triage reveal normal white blood cell count, no left shift.  Normal lactic acid.  She is afebrile here. Initially tachycardic, improving with fluids.   We will plan for I&D of the small associated abscess (see procedure note above), and give a dose of IV antibiotics here.  Given she has been on oral antibiotics for less than 24 hours, do not feel this represents failure of outpatient therapy.  She is currently prescribed Bactrim, will add Keflex to her regimen, and plan for 24 to 48-hour follow-up. Discussed wound care, as well as return precautions. She voices understanding of plan.    Final Clinical Impression(s) / ED Diagnosis  Final diagnoses:  Abdominal wall cellulitis  Abscess       Note:  This document was prepared using Dragon voice recognition software and may include  unintentional dictation errors.   Miguel Aschoff., MD 01/16/19 [phone]

## 2019-01-19 ENCOUNTER — Other Ambulatory Visit: Payer: Self-pay

## 2019-01-19 ENCOUNTER — Encounter: Payer: Self-pay | Admitting: Emergency Medicine

## 2019-01-19 ENCOUNTER — Emergency Department
Admission: EM | Admit: 2019-01-19 | Discharge: 2019-01-19 | Disposition: A | Discharge status: Home / Self Care | Payer: BC Managed Care – PPO | Attending: Emergency Medicine | Admitting: Emergency Medicine

## 2019-01-19 DIAGNOSIS — Z5189 Encounter for other specified aftercare: Secondary | ICD-10-CM

## 2019-01-19 DIAGNOSIS — E119 Type 2 diabetes mellitus without complications: Secondary | ICD-10-CM | POA: Diagnosis not present

## 2019-01-19 DIAGNOSIS — L02211 Cutaneous abscess of abdominal wall: Secondary | ICD-10-CM | POA: Insufficient documentation

## 2019-01-19 DIAGNOSIS — Z794 Long term (current) use of insulin: Secondary | ICD-10-CM | POA: Diagnosis not present

## 2019-01-19 NOTE — Discharge Instructions (Addendum)
Follow-up with your primary care provider or can no clinic acute care if any continued problems or concerns.  Begin using warm compresses to the area frequently and cover as long as there is drainage.

## 2019-01-19 NOTE — ED Provider Notes (Signed)
[facility] Emergency Department Provider Note   ____________________________________________   First MD Initiated Contact with Patient 01/19/19 [phone]     (approximate)  I have reviewed the triage vital signs and the nursing notes.   HISTORY  Chief Complaint Follow-up   HPI Laurie Ortiz is a 28 y.o. female presents to the ED for follow-up after an I&D of an abscess on her right lower abdomen.  Has been taking the antibiotic as directed.  She states that there is been some drainage on the dressing and is slightly tender but denies any other symptoms.      Past Medical History:  Diagnosis Date  . Complication of anesthesia    slow to wake after wisdom teeth extraction  . COVID-19 11/12/2018   tested (+) 11/16/18,  (-) 12/01/18  . Diabetes mellitus, type 2 ([place])   . Family history of adverse reaction to anesthesia    Mother - slow to wake  . History of chlamydia 08/2015  . History of Papanicolaou smear of cervix 04/22/13; 08/30/15   NEG CT/GC NEG; NEG CT-POS/GC/TR NEG  . History of vaginitis   . Hypercholesteremia   . Obesity (BMI 30.0-34.9)     Patient Active Problem List   Diagnosis Date Noted  . Chest pain 11/24/2018  . Uncontrolled diabetes mellitus with hyperglycemia ([place]) 11/24/2018  . COVID-19 virus infection 11/24/2018  . DKA (diabetic ketoacidoses) ([place]) 06/30/2018  . Chlamydia 09/18/2016  . Trichimoniasis 09/18/2016  . Gonorrhea 07/29/2016  . Cyst of right Bartholin's gland 07/29/2016  . Exposure to STD 07/21/2016    Past Surgical History:  Procedure Laterality Date  . VAGINA SURGERY     age 57 or 81  . WISDOM TOOTH EXTRACTION      Prior to Admission medications   Medication Sig Start Date End Date Taking? Authorizing Provider  cephALEXin (KEFLEX) 500 MG capsule Take 1 capsule (500 mg total) by mouth 4 (four) times daily for 7 days. 01/15/19 01/22/19  Lilia Pro., MD  insulin lispro (HUMALOG KWIKPEN) 100 UNIT/ML KwikPen  -Humalog 1 unit for every 10 grams of carbohydrate (with each meal and snack) -Meal time Humalog correction scale before meals (to be added to meal coverage): 141-180 mg/dL- 1 unit 181-220 mg/dL- 2 units 221-260 mg/dL- 3 units 261-300 mg/dL- 4 units 301-340 mg/dL- 5 units 341-380 mg/dL- 6 units 381-420 mg/dL- 7 units -Bedtime Humalog correction scale:             181-240 mg/dL-1 unit             241-300 mg/dL-2 units             301-360 mg/dL-3 units             361-420 mg/dL-4 units  Check blood sugars 4-5 times a day.  Check first thing in the morning, before each meal and at bedtime 07/02/18   Bettey Costa, MD  Levonorgestrel-Ethinyl Estradiol (AMETHIA) 0.15-0.03 &0.01 MG tablet Take 1 tablet by mouth daily. 09/27/05   Copland, Deirdre Evener, PA-C  simvastatin (ZOCOR) 20 MG tablet Take 20 mg by mouth at bedtime.  05/06/16   [provider]  TOUJEO SOLOSTAR 300 UNIT/ML SOPN Inject 20-25 Units into the skin at bedtime. Takes 22 at night 10:00 PM Patient taking differently: Inject 20 Units into the skin at bedtime.  07/02/18   Bettey Costa, MD  valACYclovir (VALTREX) 500 MG tablet Take 1 tablet (500 mg total) by mouth daily. 06/18/08   Copland, Elmo Putt  B, PA-C    Allergies Penicillins and Amoxicillin  Family History  Problem Relation Age of Onset  . Hypertension Mother   . Heart disease Maternal Grandmother   . Breast cancer Paternal Grandmother        breast cancer in late 40s-died from breast cancer  . Prostate cancer Paternal Grandfather   . Dementia Paternal Grandfather   . Parkinson's disease Paternal Grandfather     Social History Social History   Tobacco Use  . Smoking status: Never Smoker  . Smokeless tobacco: Never Used  Substance Use Topics  . Alcohol use: Not Currently    Comment: OCC  . Drug use: No    Review of Systems Constitutional: No fever/chills Cardiovascular: Denies chest pain. Respiratory: Denies shortness of breath. Gastrointestinal: No abdominal  pain.  No nausea, no vomiting.  Musculoskeletal: Negative for back pain. Skin: Abscess right lower abdomen. Neurological: Negative for headaches, focal weakness or numbness. ___________________________________________   PHYSICAL EXAM:  VITAL SIGNS: ED Triage Vitals  Enc Vitals Group     BP 01/19/19 0914 118/74     Pulse Rate 01/19/19 0914 94     Resp 01/19/19 0914 18     Temp 01/19/19 0914 98 F (36.7 C)     Temp src --      SpO2 01/19/19 0914 99 %     Weight 01/19/19 0911 250 lb (113.4 kg)     Height 01/19/19 0911 5\' 7"  (1.702 m)     Head Circumference --      Peak Flow --      Pain Score 01/19/19 0911 2     Pain Loc --      Pain Edu? --      Excl. in [place]? --    Constitutional: Alert and oriented. Well appearing and in no acute distress. Eyes: Conjunctivae are normal.  Head: Atraumatic. Neck: No stridor.   Cardiovascular: Normal rate, regular rhythm. Grossly normal heart sounds.  Good peripheral circulation. Respiratory: Normal respiratory effort.  No retractions. Lungs CTAB. Gastrointestinal: Soft and nontender. No distention. Musculoskeletal: Moves upper and lower extremities without any difficulty. Neurologic:  Normal speech and language. No gross focal neurologic deficits are appreciated. No gait instability. Skin:  Skin is warm, dry.  There is an area to the right lower quadrant of her torso that is healing without any continued active drainage.  Area is minimally tender.  No warmth or erythema was noted. Psychiatric: Mood and affect are normal. Speech and behavior are normal.  ____________________________________________   LABS (all labs ordered are listed, but only abnormal results are displayed)  Labs Reviewed - No data to display ____________________________________________   PROCEDURES  Procedure(s) performed (including Critical Care):  Procedures  ____________________________________________   INITIAL IMPRESSION / ASSESSMENT AND PLAN / ED COURSE   As part of my medical decision making, I reviewed the following data within the electronic MEDICAL RECORD NUMBER Notes from prior ED visits and [place] Controlled Substance Database  28 year old female presents to the ED for recheck of an area that she had I&D on her abdomen.  Patient denies any fever or chills.  She has had little to no drainage recently.  She continues to have it covered and reports decreased pain.  Area appears to be healing without any complications.  Minimal tenderness on palpation.  No active drainage.  Patient will continue using warm compresses to the area and follow-up with her PCP.   ____________________________________________   FINAL CLINICAL IMPRESSION(S) / ED DIAGNOSES  Final  diagnoses:  Wound check, abscess     ED Discharge Orders    None       Note:  This document was prepared using Dragon voice recognition software and may include unintentional dictation errors.    Tommi Rumps, PA-C 01/19/19 1603    Emily Filbert, MD 01/20/19 [phone]

## 2019-01-19 NOTE — ED Triage Notes (Signed)
Pt here for follow up from visit this past Saturday, had gauze packing in wound on right lower abd, was told to remove 48 hours after packed, doesn't have a primary care MD so came here to have it looked at. Site with band aid intact, some bleeding noted on drsg. Pt states place is slightly tender. NAD.

## 2019-02-02 DIAGNOSIS — E1065 Type 1 diabetes mellitus with hyperglycemia: Secondary | ICD-10-CM | POA: Insufficient documentation

## 2019-02-28 ENCOUNTER — Encounter: Payer: Self-pay | Admitting: *Deleted

## 2019-02-28 ENCOUNTER — Other Ambulatory Visit: Payer: Self-pay

## 2019-03-01 ENCOUNTER — Other Ambulatory Visit
Admission: RE | Admit: 2019-03-01 | Discharge: 2019-03-01 | Disposition: A | Discharge status: Home / Self Care | Payer: BC Managed Care – PPO | Source: Ambulatory Visit | Attending: Unknown Physician Specialty | Admitting: Unknown Physician Specialty

## 2019-03-01 DIAGNOSIS — Z01812 Encounter for preprocedural laboratory examination: Secondary | ICD-10-CM | POA: Diagnosis present

## 2019-03-01 DIAGNOSIS — U071 COVID-19: Secondary | ICD-10-CM | POA: Diagnosis not present

## 2019-03-01 LAB — SARS CORONAVIRUS 2 (TAT 6-24 HRS): SARS Coronavirus 2: POSITIVE — AB

## 2019-03-02 NOTE — Discharge Instructions (Signed)
[facility] °[facility] °ENDOSCOPIC SINUS SURGERY °[facility] ° °What is Functional Endoscopic Sinus Surgery? ° The Surgery involves making the natural openings of the sinuses larger by removing the bony partitions that separate the sinuses from the nasal cavity.  The natural sinus lining is preserved as much as possible to allow the sinuses to resume normal function after the surgery.  In some patients nasal polyps (excessively swollen lining of the sinuses) may be removed to relieve obstruction of the sinus openings.  The surgery is performed through the nose using lighted scopes, which eliminates the need for incisions on the face.  A septoplasty is a different procedure which is sometimes performed with sinus surgery.  It involves straightening the boy partition that separates the two sides of your nose.  A crooked or deviated septum may need repair if is obstructing the sinuses or nasal airflow.  Turbinate reduction is also often performed during sinus surgery.  The turbinates are bony proturberances from the side walls of the nose which swell and can obstruct the nose in patients with sinus and allergy problems.  Their size can be surgically reduced to help relieve nasal obstruction. ° °What Can Sinus Surgery Do For Me? ° Sinus surgery can reduce the frequency of sinus infections requiring antibiotic treatment.  This can provide improvement in nasal congestion, post-nasal drainage, facial pressure and nasal obstruction.  Surgery will NOT prevent you from ever having an infection again, so it usually only for patients who get infections 4 or more times yearly requiring antibiotics, or for infections that do not clear with antibiotics.  It will not cure nasal allergies, so patients with allergies may still require medication to treat their allergies after surgery. Surgery may improve headaches related to sinusitis, however, some people will continue to  require medication to control sinus headaches related to allergies.  Surgery will do nothing for other forms of headache (migraine, tension or cluster). ° °What Are the Risks of Endoscopic Sinus Surgery? ° Current techniques allow surgery to be performed safely with little risk, however, there are rare complications that patients should be aware of.  Because the sinuses are located around the eyes, there is risk of eye injury, including blindness, though again, this would be quite rare. This is usually a result of bleeding behind the eye during surgery, which puts the vision oat risk, though there are treatments to protect the vision and prevent permanent disrupted by surgery causing a leak of the spinal fluid that surrounds the brain.  More serious complications would include bleeding inside the brain cavity or damage to the brain.  Again, all of these complications are uncommon, and spinal fluid leaks can be safely managed surgically if they occur.  The most common complication of sinus surgery is bleeding from the nose, which may require packing or cauterization of the nose.  Continued sinus have polyps may experience recurrence of the polyps requiring revision surgery.  Alterations of sense of smell or injury to the tear ducts are also rare complications.  ° °What is the Surgery Like, and what is the Recovery? ° The Surgery usually takes a couple of hours to perform, and is usually performed under a general anesthetic (completely asleep).  Patients are usually discharged home after a couple of hours.  Sometimes during surgery it is necessary to pack the nose to control bleeding, and the packing is left in place for 24 - 48 hours, and removed by your surgeon.    If a septoplasty was performed during the procedure, there is often a splint placed which must be removed after 5-7 days.   °Discomfort: Pain is usually mild to moderate, and can be controlled by prescription pain medication or acetaminophen (Tylenol).   Aspirin, Ibuprofen (Advil, Motrin), or Naprosyn (Aleve) should be avoided, as they can cause increased bleeding.  Most patients feel sinus pressure like they have a bad head cold for several days.  Sleeping with your head elevated can help reduce swelling and facial pressure, as can ice packs over the face.  A humidifier may be helpful to keep the mucous and blood from drying in the nose.  ° °Diet: There are no specific diet restrictions, however, you should generally start with clear liquids and a light diet of bland foods because the anesthetic can cause some nausea.  Advance your diet depending on how your stomach feels.  Taking your pain medication with food will often help reduce stomach upset which pain medications can cause. ° °Nasal Saline Irrigation: It is important to remove blood clots and dried mucous from the nose as it is healing.  This is done by having you irrigate the nose at least 3 - 4 times daily with a salt water solution.  We recommend using NeilMed Sinus Rinse (available at the drug store).  Fill the squeeze bottle with the solution, bend over a sink, and insert the tip of the squeeze bottle into the nose ½ of an inch.  Point the tip of the squeeze bottle towards the inside corner of the eye on the same side your irrigating.  Squeeze the bottle and gently irrigate the nose.  If you bend forward as you do this, most of the fluid will flow back out of the nose, instead of down your throat.   The solution should be warm, near body temperature, when you irrigate.   Each time you irrigate, you should use a full squeeze bottle.  ° °Note that if you are instructed to use Nasal Steroid Sprays at any time after your surgery, irrigate with saline BEFORE using the steroid spray, so you do not wash it all out of the nose. °Another product, Nasal Saline Gel (such as AYR Nasal Saline Gel) can be applied in each nostril 3 - 4 times daily to moisture the nose and reduce scabbing or crusting. ° °Bleeding:   Bloody drainage from the nose can be expected for several days, and patients are instructed to irrigate their nose frequently with salt water to help remove mucous and blood clots.  The drainage may be dark red or brown, though some fresh blood may be seen intermittently, especially after irrigation.  Do not blow you nose, as bleeding may occur. If you must sneeze, keep your mouth open to allow air to escape through your mouth. ° °If heavy bleeding occurs: Irrigate the nose with saline to rinse out clots, then spray the nose 3 - 4 times with Afrin Nasal Decongestant Spray.  The spray will constrict the blood vessels to slow bleeding.  Pinch the lower half of your nose shut to apply pressure, and lay down with your head elevated.  Ice packs over the nose may help as well. If bleeding persists despite these measures, you should notify your doctor.  Do not use the Afrin routinely to control nasal congestion after surgery, as it can result in worsening congestion and may affect healing.  ° ° ° °Activity: Return to work varies among patients. Most patients will be   out of work at least 5 - 7 days to recover.  Patient may return to work after they are off of narcotic pain medication, and feeling well enough to perform the functions of their job.  Patients must avoid heavy lifting (over 10 pounds) or strenuous physical for 2 weeks after surgery, so your employer may need to assign you to light duty, or keep you out of work longer if light duty is not possible.  NOTE: you should not drive, operate dangerous machinery, do any mentally demanding tasks or make any important legal or financial decisions while on narcotic pain medication and recovering from the general anesthetic.  °  °Call Your Doctor Immediately if You Have Any of the Following: °1. Bleeding that you cannot control with the above measures °2. Loss of vision, double vision, bulging of the eye or black eyes. °3. Fever over 101 degrees °4. Neck stiffness with  severe headache, fever, nausea and change in mental state. °You are always encourage to call anytime with concerns, however, please call with requests for pain medication refills during office hours. ° °Office Endoscopy: During follow-up visits your doctor will remove any packing or splints that may have been placed and evaluate and clean your sinuses endoscopically.  Topical anesthetic will be used to make this as comfortable as possible, though you may want to take your pain medication prior to the visit.  How often this will need to be done varies from patient to patient.  After complete recovery from the surgery, you may need follow-up endoscopy from time to time, particularly if there is concern of recurrent infection or nasal polyps. ° ° °General Anesthesia, Adult, Care After °This sheet gives you information about how to care for yourself after your procedure. Your health care provider may also give you more specific instructions. If you have problems or questions, contact your health care provider. °What can I expect after the procedure? °After the procedure, the following side effects are common: °· Pain or discomfort at the IV site. °· Nausea. °· Vomiting. °· Sore throat. °· Trouble concentrating. °· Feeling cold or chills. °· Weak or tired. °· Sleepiness and fatigue. °· Soreness and body aches. These side effects can affect parts of the body that were not involved in surgery. °Follow these instructions at home: ° °For at least 24 hours after the procedure: °· Have a responsible adult stay with you. It is important to have someone help care for you until you are awake and alert. °· Rest as needed. °· Do not: °? Participate in activities in which you could fall or become injured. °? Drive. °? Use heavy machinery. °? Drink alcohol. °? Take sleeping pills or medicines that cause drowsiness. °? Make important decisions or sign legal documents. °? Take care of children on your own. °Eating and  drinking °· Follow any instructions from your health care provider about eating or drinking restrictions. °· When you feel hungry, start by eating small amounts of foods that are soft and easy to digest (bland), such as toast. Gradually return to your regular diet. °· Drink enough fluid to keep your urine pale yellow. °· If you vomit, rehydrate by drinking water, juice, or clear broth. °General instructions °· If you have sleep apnea, surgery and certain medicines can increase your risk for breathing problems. Follow instructions from your health care provider about wearing your sleep device: °? Anytime you are sleeping, including during daytime naps. °? While taking prescription pain medicines, sleeping medicines, or medicines   that make you drowsy. °· Return to your normal activities as told by your health care provider. Ask your health care provider what activities are safe for you. °· Take over-the-counter and prescription medicines only as told by your health care provider. °· If you smoke, do not smoke without supervision. °· Keep all follow-up visits as told by your health care provider. This is important. °Contact a health care provider if: °· You have nausea or vomiting that does not get better with medicine. °· You cannot eat or drink without vomiting. °· You have pain that does not get better with medicine. °· You are unable to pass urine. °· You develop a skin rash. °· You have a fever. °· You have redness around your IV site that gets worse. °Get help right away if: °· You have difficulty breathing. °· You have chest pain. °· You have blood in your urine or stool, or you vomit blood. °Summary °· After the procedure, it is common to have a sore throat or nausea. It is also common to feel tired. °· Have a responsible adult stay with you for the first 24 hours after general anesthesia. It is important to have someone help care for you until you are awake and alert. °· When you feel hungry, start by eating  small amounts of foods that are soft and easy to digest (bland), such as toast. Gradually return to your regular diet. °· Drink enough fluid to keep your urine pale yellow. °· Return to your normal activities as told by your health care provider. Ask your health care provider what activities are safe for you. °This information is not intended to replace advice given to you by your health care provider. Make sure you discuss any questions you have with your health care provider. °Document Released: 07/07/2000 Document Revised: 04/03/2017 Document Reviewed: 11/14/2016 °Elsevier Patient Education © 2020 [employer] ° °

## 2019-03-03 NOTE — Anesthesia Preprocedure Evaluation (Addendum)
Anesthesia Evaluation  Patient identified by MRN, date of birth, ID band Patient awake    Reviewed: Allergy & Precautions, NPO status , Patient's Chart, lab work & pertinent test results  History of Anesthesia Complications Negative for: history of anesthetic complications  Airway Mallampati: II  TM Distance: >3 FB Neck ROM: Full    Dental   Pulmonary    breath sounds clear to auscultation       Cardiovascular (-) angina(-) DOE  Rhythm:Regular Rate:Normal   HLD   Neuro/Psych    GI/Hepatic neg GERD  ,  Endo/Other  diabetes, Type 2  Renal/GU      Musculoskeletal   Abdominal (+) + obese (BMI 40),   Peds  Hematology   Anesthesia Other Findings COVID+ 11/16/2018, 03/01/2019 "...go ahead with the surgery, no need to postpone or retest. if you surgical team is feeling uncomfortable wearing standard PPE (surgical mask, eye protection, gown, and glove) then can do n95 instead of the surgical mask." [facility] [place] [facility]  [facility]  Reproductive/Obstetrics                           Anesthesia Physical Anesthesia Plan  ASA: III  Anesthesia Plan: General   Post-op Pain Management:    Induction: Intravenous  PONV Risk Score and Plan: 3 and Ondansetron, Dexamethasone, Midazolam and Treatment may vary due to age or medical condition  Airway Management Planned: Oral ETT  Additional Equipment:   Intra-op Plan:   Post-operative Plan: Extubation in OR  Informed Consent: I have reviewed the patients History and Physical, chart, labs and discussed the procedure including the risks, benefits and alternatives for the proposed anesthesia with the patient or authorized representative who has indicated his/her understanding and acceptance.       Plan Discussed with: CRNA and Anesthesiologist  Anesthesia Plan Comments:         Anesthesia Quick Evaluation

## 2019-03-04 ENCOUNTER — Ambulatory Visit: Payer: BC Managed Care – PPO | Admitting: Anesthesiology

## 2019-03-04 ENCOUNTER — Encounter
Admission: RE | Disposition: A | Discharge status: Home / Self Care | Payer: Self-pay | Source: Home / Self Care | Attending: Unknown Physician Specialty

## 2019-03-04 ENCOUNTER — Other Ambulatory Visit: Payer: Self-pay

## 2019-03-04 ENCOUNTER — Ambulatory Visit
Admission: RE | Admit: 2019-03-04 | Discharge: 2019-03-04 | Disposition: A | Discharge status: Home / Self Care | Payer: BC Managed Care – PPO | Attending: Unknown Physician Specialty | Admitting: Unknown Physician Specialty

## 2019-03-04 DIAGNOSIS — J3489 Other specified disorders of nose and nasal sinuses: Secondary | ICD-10-CM | POA: Diagnosis present

## 2019-03-04 DIAGNOSIS — J343 Hypertrophy of nasal turbinates: Secondary | ICD-10-CM | POA: Insufficient documentation

## 2019-03-04 DIAGNOSIS — Z79899 Other long term (current) drug therapy: Secondary | ICD-10-CM | POA: Insufficient documentation

## 2019-03-04 DIAGNOSIS — E785 Hyperlipidemia, unspecified: Secondary | ICD-10-CM | POA: Diagnosis not present

## 2019-03-04 DIAGNOSIS — E119 Type 2 diabetes mellitus without complications: Secondary | ICD-10-CM | POA: Diagnosis not present

## 2019-03-04 DIAGNOSIS — Z794 Long term (current) use of insulin: Secondary | ICD-10-CM | POA: Diagnosis not present

## 2019-03-04 DIAGNOSIS — U071 COVID-19: Secondary | ICD-10-CM | POA: Insufficient documentation

## 2019-03-04 HISTORY — PX: NASAL SEPTOPLASTY W/ TURBINOPLASTY: SHX2070

## 2019-03-04 LAB — GLUCOSE, CAPILLARY
Glucose-Capillary: 103 mg/dL — ABNORMAL HIGH (ref 70–99)
Glucose-Capillary: 83 mg/dL (ref 70–99)

## 2019-03-04 LAB — POCT PREGNANCY, URINE: Preg Test, Ur: NEGATIVE

## 2019-03-04 SURGERY — SEPTOPLASTY, NOSE, WITH NASAL TURBINATE REDUCTION
Anesthesia: General | Site: Nose | Laterality: Bilateral

## 2019-03-04 MED ORDER — MIDAZOLAM HCL 5 MG/5ML IJ SOLN
INTRAMUSCULAR | Status: DC | PRN
  Administered 2019-03-04: 2 mg via INTRAVENOUS

## 2019-03-04 MED ORDER — ONDANSETRON HCL 4 MG/2ML IJ SOLN
INTRAMUSCULAR | Status: DC | PRN
  Administered 2019-03-04: 4 mg via INTRAVENOUS

## 2019-03-04 MED ORDER — BACITRACIN 500 UNIT/GM EX OINT
TOPICAL_OINTMENT | CUTANEOUS | Status: DC | PRN
  Administered 2019-03-04: 1 via TOPICAL

## 2019-03-04 MED ORDER — PHENYLEPHRINE HCL 0.5 % NA SOLN
NASAL | Status: DC | PRN
  Administered 2019-03-04: 30 mL via NASAL

## 2019-03-04 MED ORDER — DEXAMETHASONE SODIUM PHOSPHATE 4 MG/ML IJ SOLN
INTRAMUSCULAR | Status: DC | PRN
  Administered 2019-03-04: 10 mg via INTRAVENOUS

## 2019-03-04 MED ORDER — OXYCODONE HCL 5 MG PO TABS: 5.0000 mg | ORAL_TABLET | Freq: Once | ORAL | Status: AC | PRN

## 2019-03-04 MED ORDER — LIDOCAINE-EPINEPHRINE 1 %-1:100000 IJ SOLN
INTRAMUSCULAR | Status: DC | PRN
  Administered 2019-03-04: 12 mL

## 2019-03-04 MED ORDER — OXYCODONE HCL 5 MG/5ML PO SOLN
5.0000 mg | Freq: Once | ORAL | Status: AC | PRN
  Administered 2019-03-04: 5 mg via ORAL

## 2019-03-04 MED ORDER — LACTATED RINGERS IV SOLN
100.0000 mL/h | INTRAVENOUS | Status: DC
  Administered 2019-03-04: 100 mL/h via INTRAVENOUS

## 2019-03-04 MED ORDER — ACETAMINOPHEN 10 MG/ML IV SOLN
INTRAVENOUS | Status: DC | PRN
  Administered 2019-03-04: 1000 mg via INTRAVENOUS

## 2019-03-04 MED ORDER — LIDOCAINE HCL (CARDIAC) PF 100 MG/5ML IV SOSY
PREFILLED_SYRINGE | INTRAVENOUS | Status: DC | PRN
  Administered 2019-03-04: 50 mg via INTRAVENOUS

## 2019-03-04 MED ORDER — HYDROCODONE-ACETAMINOPHEN 5-300 MG PO TABS: 1.0000 | ORAL_TABLET | ORAL | 0 refills | Status: DC | PRN

## 2019-03-04 MED ORDER — HYDROMORPHONE HCL 1 MG/ML IJ SOLN: 0.2500 mg | INTRAMUSCULAR | Status: DC | PRN

## 2019-03-04 MED ORDER — GLYCOPYRROLATE 0.2 MG/ML IJ SOLN
INTRAMUSCULAR | Status: DC | PRN
  Administered 2019-03-04: 0.1 mg via INTRAVENOUS

## 2019-03-04 MED ORDER — PROMETHAZINE HCL 25 MG/ML IJ SOLN: 6.2500 mg | INTRAMUSCULAR | Status: DC | PRN

## 2019-03-04 MED ORDER — OXYMETAZOLINE HCL 0.05 % NA SOLN
6.0000 | Freq: Once | NASAL | Status: AC
  Administered 2019-03-04: 6 via NASAL

## 2019-03-04 MED ORDER — SULFAMETHOXAZOLE-TRIMETHOPRIM 800-160 MG PO TABS: 1.0000 | ORAL_TABLET | Freq: Two times a day (BID) | ORAL | 0 refills | Status: DC

## 2019-03-04 MED ORDER — SUCCINYLCHOLINE CHLORIDE 20 MG/ML IJ SOLN
INTRAMUSCULAR | Status: DC | PRN
  Administered 2019-03-04: 100 mg via INTRAVENOUS

## 2019-03-04 MED ORDER — PROPOFOL 10 MG/ML IV BOLUS
INTRAVENOUS | Status: DC | PRN
  Administered 2019-03-04: 200 mg via INTRAVENOUS

## 2019-03-04 MED ORDER — FENTANYL CITRATE (PF) 100 MCG/2ML IJ SOLN
INTRAMUSCULAR | Status: DC | PRN
  Administered 2019-03-04: 100 ug via INTRAVENOUS

## 2019-03-04 MED ORDER — MEPERIDINE HCL 25 MG/ML IJ SOLN: 6.2500 mg | INTRAMUSCULAR | Status: DC | PRN

## 2019-03-04 SURGICAL SUPPLY — 26 items
COAG SUCT 10F 3.5MM HAND CTRL (MISCELLANEOUS) ×3 IMPLANT
DRAPE HEAD BAR (DRAPES) ×3 IMPLANT
DRESSING NASL FOAM PST OP SINU (MISCELLANEOUS) IMPLANT
DRSG NASAL FOAM POST OP SINU (MISCELLANEOUS) ×6
ELECT REM PT RETURN 9FT ADLT (ELECTROSURGICAL) ×3
ELECTRODE REM PT RTRN 9FT ADLT (ELECTROSURGICAL) ×1 IMPLANT
GLOVE BIO SURGEON STRL SZ7.5 (GLOVE) ×12 IMPLANT
GOWN STRL REUS W/ TWL LRG LVL3 (GOWN DISPOSABLE) IMPLANT
GOWN STRL REUS W/TWL LRG LVL3 (GOWN DISPOSABLE) ×2
HANDLE YANKAUER SUCT BULB TIP (MISCELLANEOUS) ×3 IMPLANT
KIT TURNOVER KIT A (KITS) ×3 IMPLANT
NDL HYPO 25GX1X1/2 BEV (NEEDLE) ×1 IMPLANT
NEEDLE HYPO 25GX1X1/2 BEV (NEEDLE) ×3 IMPLANT
PACK ENT CUSTOM (PACKS) ×3 IMPLANT
SPLINT NASAL SEPTAL BLV .25 LG (MISCELLANEOUS) IMPLANT
SPLINT NASAL SEPTAL BLV .50 ST (MISCELLANEOUS) ×2 IMPLANT
SPONGE NEURO XRAY DETECT 1X3 (DISPOSABLE) ×3 IMPLANT
STRAP BODY AND KNEE 60X3 (MISCELLANEOUS) ×3 IMPLANT
SUT CHROMIC 3-0 (SUTURE) ×2
SUT CHROMIC 3-0 KS 27XMFL CR (SUTURE) ×1
SUT ETHILON 3-0 KS 30 BLK (SUTURE) ×3 IMPLANT
SUT PLAIN GUT 4-0 (SUTURE) IMPLANT
SUTURE CHRMC 3-0 KS 27XMFL CR (SUTURE) ×1 IMPLANT
SYR 10ML LL (SYRINGE) ×3 IMPLANT
TOWEL OR 17X26 4PK STRL BLUE (TOWEL DISPOSABLE) ×3 IMPLANT
WATER STERILE IRR 250ML POUR (IV SOLUTION) ×3 IMPLANT

## 2019-03-04 NOTE — Transfer of Care (Signed)
Immediate Anesthesia Transfer of Care Note  Patient: Laurie Ortiz  Procedure(s) Performed: NASAL SEPTOPLASTY WITH SUBMUCOUS RESECTION OF NASAL TURBINATES (Bilateral Nose)  Patient Location: PACU  Anesthesia Type: General  Level of Consciousness: awake, alert  and patient cooperative  Airway and Oxygen Therapy: Patient Spontanous Breathing and Patient connected to supplemental oxygen  Post-op Assessment: Post-op Vital signs reviewed, Patient's Cardiovascular Status Stable, Respiratory Function Stable, Patent Airway and No signs of Nausea or vomiting  Post-op Vital Signs: Reviewed and stable  Complications: No apparent anesthesia complications

## 2019-03-04 NOTE — Anesthesia Postprocedure Evaluation (Signed)
Anesthesia Post Note  Patient: Laurie Ortiz  Procedure(s) Performed: NASAL SEPTOPLASTY WITH SUBMUCOUS RESECTION OF NASAL TURBINATES (Bilateral Nose)     Patient location during evaluation: PACU Anesthesia Type: General Level of consciousness: awake and alert Pain management: pain level controlled Vital Signs Assessment: post-procedure vital signs reviewed and stable Respiratory status: spontaneous breathing, nonlabored ventilation, respiratory function stable and patient connected to nasal cannula oxygen Cardiovascular status: blood pressure returned to baseline and stable Postop Assessment: no apparent nausea or vomiting Anesthetic complications: no    Kealii Thueson A  Britten Seyfried

## 2019-03-04 NOTE — Op Note (Signed)
PREOPERATIVE DIAGNOSIS:  Chronic nasal obstruction.  POSTOPERATIVE DIAGNOSIS:  Chronic nasal obstruction.  SURGEON:  Roena Malady, M.D.  NAME OF PROCEDURE:  1. Nasal septoplasty. 2. Submucous resection of inferior turbinates.  OPERATIVE FINDINGS:  Severe nasal septal deformity, hypertrophy of the inferior turbinates.   DESCRIPTION OF THE PROCEDURE:  Laurie Ortiz was identified in the holding area and taken to the operating room and placed in the supine position.  After general endotracheal anesthesia was induced, the table was turned 45 degrees and the patient was placed in a semi-Fowler position.  The nose was then topically anesthetized with Lidocaine, cotton pledgets were placed within each nostril. After approximately 5 minutes, this was removed at which time a local anesthetic of 1% Lidocaine 1:100,000 units of Epinephrine was used to inject the inferior turbinates in the nasal septum. A total of 12 ml was used. Examination of the nose showed a severe right nasal septal deformity and tremendous hypertrophied inferior turbinate.  Beginning on the right hand side a hemitransfixion incision was then created on the leading edge of the septum on the right.  A subperichondrial plane was elevated posteriorly on the left and taken back to the perpendicular plate of the ethmoid where subperiosteal plane was elevated posteriorly on the left. A large septal spur was identified on the right hand side impacting on the inferior turbinate.  An inferior rim of cartilage was removed anteriorly with care taken to leave an anterior strut to prevent nasal collapse. With this strut removed the perpendicular plate of the ethmoid was separated from the quadrangular cartilage. The large septal spur was removed.  The septum was then replaced in the midline. Reinspection through each nostril showed excellent reduction of the septal deformity. A right posterior inferior fenestration was then created to allow  hematoma drainage.  With the septoplasty completed, beginning on the left-hand side, a 15 blade was used to incise along the inferior edge of the inferior turbinate. A superior laterally based flap was then elevated. The underlying conchal bone of mucosa was excised using Knight scissors. The flap was then laid back over the turbinate stump and cauterized using suction cautery. In a similar fashion the submucous resection was performed on the right.  With the submucous resection completed bilaterally and no active bleeding, the hemitransfixion incision was then closed using two interrupted 3-0 chromic sutures.  Plastic nasal septal splints were placed within each nostril and affixed to the septum using a 3-0 nylon suture. Stammberger was then used beneath each inferior turbinate for hemostasis.    The patient tolerated the procedure well, was returned to anesthesia, extubated in the operating room, and taken to the recovery room in stable condition.    CULTURES:  None.  SPECIMENS:  None.  ESTIMATED BLOOD LOSS:  25 cc.  Roena Malady  03/04/2019  12:42 PM

## 2019-03-04 NOTE — Anesthesia Procedure Notes (Signed)
Procedure Name: Intubation Date/Time: 03/04/2019 12:06 PM Performed by: Mayme Genta, CRNA Pre-anesthesia Checklist: Patient identified, Emergency Drugs available, Suction available, Patient being monitored and Timeout performed Patient Re-evaluated:Patient Re-evaluated prior to induction Oxygen Delivery Method: Circle system utilized Preoxygenation: Pre-oxygenation with 100% oxygen Induction Type: IV induction Ventilation: Mask ventilation without difficulty Laryngoscope Size: Miller and 2 Grade View: Grade I Tube type: Oral Rae Tube size: 7.0 mm Number of attempts: 1 Placement Confirmation: ETT inserted through vocal cords under direct vision,  positive ETCO2 and breath sounds checked- equal and bilateral Tube secured with: Tape Dental Injury: Teeth and Oropharynx as per pre-operative assessment

## 2019-03-04 NOTE — H&P (Signed)
The patient's history has been reviewed, patient examined, no change in status, stable for surgery.  Questions were answered to the patients satisfaction.  

## 2019-03-05 ENCOUNTER — Encounter (INDEPENDENT_AMBULATORY_CARE_PROVIDER_SITE_OTHER): Payer: Self-pay

## 2019-03-06 ENCOUNTER — Encounter (INDEPENDENT_AMBULATORY_CARE_PROVIDER_SITE_OTHER): Payer: Self-pay

## 2019-03-07 ENCOUNTER — Encounter: Payer: Self-pay | Admitting: Unknown Physician Specialty

## 2019-03-08 ENCOUNTER — Encounter (INDEPENDENT_AMBULATORY_CARE_PROVIDER_SITE_OTHER): Payer: Self-pay

## 2019-03-10 ENCOUNTER — Encounter (INDEPENDENT_AMBULATORY_CARE_PROVIDER_SITE_OTHER): Payer: Self-pay

## 2019-03-11 ENCOUNTER — Encounter (INDEPENDENT_AMBULATORY_CARE_PROVIDER_SITE_OTHER): Payer: Self-pay

## 2019-03-12 ENCOUNTER — Encounter (INDEPENDENT_AMBULATORY_CARE_PROVIDER_SITE_OTHER): Payer: Self-pay

## 2019-03-14 ENCOUNTER — Encounter (INDEPENDENT_AMBULATORY_CARE_PROVIDER_SITE_OTHER): Payer: Self-pay

## 2019-03-15 ENCOUNTER — Encounter (INDEPENDENT_AMBULATORY_CARE_PROVIDER_SITE_OTHER): Payer: Self-pay

## 2019-03-17 ENCOUNTER — Encounter (INDEPENDENT_AMBULATORY_CARE_PROVIDER_SITE_OTHER): Payer: Self-pay

## 2019-03-27 ENCOUNTER — Inpatient Hospital Stay
Admission: EM | Admit: 2019-03-27 | Discharge: 2019-03-30 | DRG: 638 | Disposition: A | Discharge status: Home / Self Care | Payer: BC Managed Care – PPO | Attending: Internal Medicine | Admitting: Internal Medicine

## 2019-03-27 ENCOUNTER — Emergency Department: Payer: BC Managed Care – PPO

## 2019-03-27 ENCOUNTER — Other Ambulatory Visit: Payer: Self-pay

## 2019-03-27 ENCOUNTER — Encounter: Payer: Self-pay | Admitting: Emergency Medicine

## 2019-03-27 DIAGNOSIS — Z8619 Personal history of other infectious and parasitic diseases: Secondary | ICD-10-CM | POA: Diagnosis not present

## 2019-03-27 DIAGNOSIS — R110 Nausea: Secondary | ICD-10-CM

## 2019-03-27 DIAGNOSIS — Z794 Long term (current) use of insulin: Secondary | ICD-10-CM | POA: Diagnosis not present

## 2019-03-27 DIAGNOSIS — E1010 Type 1 diabetes mellitus with ketoacidosis without coma: Secondary | ICD-10-CM | POA: Diagnosis present

## 2019-03-27 DIAGNOSIS — E876 Hypokalemia: Secondary | ICD-10-CM | POA: Diagnosis not present

## 2019-03-27 DIAGNOSIS — E7800 Pure hypercholesterolemia, unspecified: Secondary | ICD-10-CM | POA: Diagnosis present

## 2019-03-27 DIAGNOSIS — R739 Hyperglycemia, unspecified: Secondary | ICD-10-CM | POA: Diagnosis present

## 2019-03-27 DIAGNOSIS — Z6841 Body Mass Index (BMI) 40.0 and over, adult: Secondary | ICD-10-CM

## 2019-03-27 DIAGNOSIS — Z23 Encounter for immunization: Secondary | ICD-10-CM | POA: Diagnosis not present

## 2019-03-27 DIAGNOSIS — E1110 Type 2 diabetes mellitus with ketoacidosis without coma: Secondary | ICD-10-CM | POA: Diagnosis present

## 2019-03-27 DIAGNOSIS — Z8616 Personal history of COVID-19: Secondary | ICD-10-CM | POA: Diagnosis present

## 2019-03-27 LAB — CBC
HCT: 45.5 % (ref 36.0–46.0)
Hemoglobin: 15.6 g/dL — ABNORMAL HIGH (ref 12.0–15.0)
MCH: 30.4 pg (ref 26.0–34.0)
MCHC: 34.3 g/dL (ref 30.0–36.0)
MCV: 88.5 fL (ref 80.0–100.0)
Platelets: 317 10*3/uL (ref 150–400)
RBC: 5.14 MIL/uL — ABNORMAL HIGH (ref 3.87–5.11)
RDW: 12.4 % (ref 11.5–15.5)
WBC: 6.6 10*3/uL (ref 4.0–10.5)
nRBC: 0 % (ref 0.0–0.2)

## 2019-03-27 LAB — URINALYSIS, COMPLETE (UACMP) WITH MICROSCOPIC
Bacteria, UA: NONE SEEN
Bilirubin Urine: NEGATIVE
Glucose, UA: >=500 mg/dL — AB
Hgb urine dipstick: NEGATIVE
Ketones, ur: 80 mg/dL — AB
Leukocytes,Ua: NEGATIVE
Nitrite: NEGATIVE
Protein, ur: 100 mg/dL — AB
Specific Gravity, Urine: 1.027 (ref 1.005–1.030)
WBC, UA: NONE SEEN WBC/hpf (ref 0–5)
pH: 5 (ref 5.0–8.0)

## 2019-03-27 LAB — BETA-HYDROXYBUTYRIC ACID: Beta-Hydroxybutyric Acid: 7.79 mmol/L — ABNORMAL HIGH (ref 0.05–0.27)

## 2019-03-27 LAB — BASIC METABOLIC PANEL
Anion gap: 18 — ABNORMAL HIGH (ref 5–15)
BUN: 13 mg/dL (ref 6–20)
CO2: 9 mmol/L — ABNORMAL LOW (ref 22–32)
Calcium: 8.8 mg/dL — ABNORMAL LOW (ref 8.9–10.3)
Chloride: 104 mmol/L (ref 98–111)
Creatinine, Ser: 0.87 mg/dL (ref 0.44–1.00)
GFR calc Af Amer: >60 mL/min (ref >60)
GFR calc non Af Amer: >60 mL/min (ref >60)
Glucose, Bld: 357 mg/dL — ABNORMAL HIGH (ref 70–99)
Potassium: 4.3 mmol/L (ref 3.5–5.1)
Sodium: 131 mmol/L — ABNORMAL LOW (ref 135–145)

## 2019-03-27 LAB — BLOOD GAS, VENOUS
Acid-base deficit: 23 mmol/L — ABNORMAL HIGH (ref 0.0–2.0)
Bicarbonate: 6.2 mmol/L — ABNORMAL LOW (ref 20.0–28.0)
O2 Saturation: 68.9 %
Patient temperature: 37
pCO2, Ven: 23 mmHg — ABNORMAL LOW (ref 44.0–60.0)
pH, Ven: 7.04 — CL (ref 7.250–7.430)
pO2, Ven: 54 mmHg — ABNORMAL HIGH (ref 32.0–45.0)

## 2019-03-27 LAB — GLUCOSE, CAPILLARY
Glucose-Capillary: 416 mg/dL — ABNORMAL HIGH (ref 70–99)
Glucose-Capillary: 316 mg/dL — ABNORMAL HIGH (ref 70–99)
Glucose-Capillary: 259 mg/dL — ABNORMAL HIGH (ref 70–99)

## 2019-03-27 LAB — POCT PREGNANCY, URINE: Preg Test, Ur: NEGATIVE

## 2019-03-27 MED ORDER — DEXTROSE-NACL 5-0.45 % IV SOLN
INTRAVENOUS | Status: DC
  Administered 2019-03-28 (×2): via INTRAVENOUS

## 2019-03-27 MED ORDER — POTASSIUM CHLORIDE 10 MEQ/100ML IV SOLN
10.0000 meq | INTRAVENOUS | Status: DC
  Filled 2019-03-27 (×2): qty 100

## 2019-03-27 MED ORDER — SODIUM CHLORIDE 0.9 % IV SOLN: INTRAVENOUS | Status: DC

## 2019-03-27 MED ORDER — ACETAMINOPHEN-CODEINE #3 300-30 MG PO TABS
1.0000 | ORAL_TABLET | ORAL | Status: DC | PRN
  Administered 2019-03-27: 1 via ORAL
  Filled 2019-03-27 (×2): qty 2
  Filled 2019-03-27: qty 1

## 2019-03-27 MED ORDER — DEXTROSE 50 % IV SOLN: 0.0000 mL | INTRAVENOUS | Status: DC | PRN

## 2019-03-27 MED ORDER — DEXTROSE-NACL 5-0.45 % IV SOLN: INTRAVENOUS | Status: DC

## 2019-03-27 MED ORDER — INSULIN REGULAR(HUMAN) IN NACL 100-0.9 UT/100ML-% IV SOLN
INTRAVENOUS | Status: DC
  Administered 2019-03-27: 19 [IU]/h via INTRAVENOUS
  Filled 2019-03-27 (×3): qty 100

## 2019-03-27 MED ORDER — ONDANSETRON HCL 4 MG/2ML IJ SOLN
4.0000 mg | Freq: Once | INTRAMUSCULAR | Status: AC | PRN
  Administered 2019-03-27: 4 mg via INTRAVENOUS
  Filled 2019-03-27: qty 2

## 2019-03-27 MED ORDER — LACTATED RINGERS IV BOLUS
1000.0000 mL | Freq: Once | INTRAVENOUS | Status: AC
  Administered 2019-03-27: 1000 mL via INTRAVENOUS

## 2019-03-27 MED ORDER — INSULIN REGULAR(HUMAN) IN NACL 100-0.9 UT/100ML-% IV SOLN: INTRAVENOUS | Status: DC

## 2019-03-27 MED ORDER — ENOXAPARIN SODIUM 40 MG/0.4ML ~~LOC~~ SOLN
40.0000 mg | SUBCUTANEOUS | Status: DC
  Administered 2019-03-27 – 2019-03-29 (×3): 40 mg via SUBCUTANEOUS
  Filled 2019-03-27 (×3): qty 0.4

## 2019-03-27 MED ORDER — POTASSIUM CHLORIDE 10 MEQ/100ML IV SOLN
10.0000 meq | INTRAVENOUS | Status: AC
  Administered 2019-03-27 – 2019-03-28 (×2): 10 meq via INTRAVENOUS
  Filled 2019-03-27 (×2): qty 100

## 2019-03-27 MED ORDER — INSULIN REGULAR(HUMAN) IN NACL 100-0.9 UT/100ML-% IV SOLN
INTRAVENOUS | Status: DC
  Administered 2019-03-28: 4.6 [IU]/h via INTRAVENOUS
  Administered 2019-03-28: 3 [IU]/h via INTRAVENOUS
  Administered 2019-03-28: 4.2 [IU]/h via INTRAVENOUS
  Filled 2019-03-27: qty 100

## 2019-03-27 NOTE — ED Triage Notes (Signed)
C/O hyperglycemia, tachypnea x 2-3 days. Patient states "I feel like I'm in DKA again"  AAOx3.  Skin warm and dry. NAD

## 2019-03-27 NOTE — ED Notes (Signed)
2nd IV access attempted at this time x 2 attempts, both unsuccessful.

## 2019-03-27 NOTE — H&P (Signed)
History and Physical    Laurie Ortiz QTM:[id number] DOB: 06/06/1990 DOA: 03/27/2019  PCP: Patient, No Pcp Per  Patient coming from: home  I have personally briefly reviewed patient's old medical records in [facility]   Chief Complaint: vomiting and weakness, I think I am in DKA   HPI: Laurie Ortiz is a 28 y.o. female with a history of type 1 diabetes past admissions for DKA as well as history of COVID-19 infection in August 2020 who presents to the emergency room with a 2-day history of vomiting and weakness.  She denies abdominal pain or change in bowel habits.  He said since she was diagnosed with Covid her blood sugars have been running high but she says she is compliant with her medication.  She denies cough, fever or chills or shortness of breath.  sHe does complain of back pain worsens lying in the stretcher.  In the emergency room she was slightly tachycardic, slightly tachypneic blood pressure of 149/88.  Surgeon saturation was 100% on room air.  Blood sugar was actually in the 400s with an anion gap of 18.  Urinalysis was unremarkable, x-ray showed no acute findings.  Patient was started on an insulin infusion and hospitalization requested.   Review of Systems: As per HPI otherwise 10 point review of systems negative.  Review of Systems  Constitutional: Positive for malaise/fatigue. Negative for chills and fever.  HENT: Negative for congestion and sinus pain.   Eyes: Negative for blurred vision and double vision.  Respiratory: Negative for cough, sputum production and shortness of breath.   Cardiovascular: Negative for chest pain and palpitations.  Gastrointestinal: Positive for vomiting.  Genitourinary: Positive for frequency. Negative for dysuria.  Musculoskeletal: Positive for back pain.  Skin: Negative for itching and rash.  Neurological: Positive for weakness. Negative for dizziness and focal weakness.     Past Medical History:  Diagnosis Date  .  Complication of anesthesia    slow to wake after wisdom teeth extraction  . COVID-19 11/12/2018   tested (+) 11/16/18,  (-) 12/01/18  . Diabetes mellitus, type 2 (HCC)   . Family history of adverse reaction to anesthesia    Mother - slow to wake  . History of chlamydia 08/2015  . History of Papanicolaou smear of cervix 04/22/13; 08/30/15   NEG CT/GC NEG; NEG CT-POS/GC/TR NEG  . History of vaginitis   . Hypercholesteremia   . Obesity (BMI 30.0-34.9)     Past Surgical History:  Procedure Laterality Date  . NASAL SEPTOPLASTY W/ TURBINOPLASTY Bilateral 03/04/2019   Procedure: NASAL SEPTOPLASTY WITH SUBMUCOUS RESECTION OF NASAL TURBINATES;  Surgeon: Linus Salmons, MD;  Location: [facility];  Service: ENT;  Laterality: Bilateral;  Diabetic - insulin  . VAGINA SURGERY     age 55 or 74  . WISDOM TOOTH EXTRACTION       reports that she has never smoked. She has never used smokeless tobacco. She reports previous alcohol use. She reports that she does not use drugs.  Allergies  Allergen Reactions  . Penicillins Hives and Rash    Doesn't take any medications ending in "cillin"  . Amoxicillin Hives and Rash    (rash 09/1996)    Family History  Problem Relation Age of Onset  . Hypertension Mother   . Heart disease Maternal Grandmother   . Breast cancer Paternal Grandmother        breast cancer in late 40s-died from breast cancer  . Prostate cancer Paternal Grandfather   .  Dementia Paternal Grandfather   . Parkinson's disease Paternal Grandfather      Prior to Admission medications   Medication Sig Start Date End Date Taking? Authorizing Provider  atorvastatin (LIPITOR) 40 MG tablet Take 40 mg by mouth daily.    [provider]  HYDROcodone-Acetaminophen 5-300 MG TABS Take 1-2 tablets by mouth every 4 (four) hours as needed. 03/04/19   Beverly Gust, MD  insulin lispro (HUMALOG KWIKPEN) 100 UNIT/ML KwikPen -Humalog 1 unit for every 10 grams of carbohydrate (with  each meal and snack) -Meal time Humalog correction scale before meals (to be added to meal coverage): 141-180 mg/dL- 1 unit 181-220 mg/dL- 2 units 221-260 mg/dL- 3 units 261-300 mg/dL- 4 units 301-340 mg/dL- 5 units 341-380 mg/dL- 6 units 381-420 mg/dL- 7 units -Bedtime Humalog correction scale:             181-240 mg/dL-1 unit             241-300 mg/dL-2 units             301-360 mg/dL-3 units             361-420 mg/dL-4 units  Check blood sugars 4-5 times a day.  Check first thing in the morning, before each meal and at bedtime 07/02/18   Bettey Costa, MD  Levonorgestrel-Ethinyl Estradiol (AMETHIA) 0.15-0.03 &0.01 MG tablet Take 1 tablet by mouth daily. 1/61/09   Copland, Elmo Putt B, PA-C  sulfamethoxazole-trimethoprim (BACTRIM DS) 800-160 MG tablet Take 1 tablet by mouth 2 (two) times daily. 03/04/19   Beverly Gust, MD  TOUJEO SOLOSTAR 300 UNIT/ML SOPN Inject 20-25 Units into the skin at bedtime. Takes 22 at night 10:00 PM Patient taking differently: Inject 20 Units into the skin at bedtime.  07/02/18   Bettey Costa, MD  valACYclovir (VALTREX) 500 MG tablet Take 1 tablet (500 mg total) by mouth daily. 6/0/45   Amalia Greenhouse    Physical Exam: Vitals:   03/27/19 1548 03/27/19 1552 03/27/19 2130 03/27/19 2145  BP:  (!) 134/93 (!) 150/93 (!) 149/88  Pulse:  (!) 117 (!) 108 (!) 103  Resp:  16 (!) 25 (!) 25  Temp:  98.3 F (36.8 C)    TempSrc:  Oral    SpO2:  100% 100% 100%  Weight: 116.6 kg     Height: 5\' 6"  (1.676 m)       Constitutional: NAD, calm, comfortable Vitals:   03/27/19 1548 03/27/19 1552 03/27/19 2130 03/27/19 2145  BP:  (!) 134/93 (!) 150/93 (!) 149/88  Pulse:  (!) 117 (!) 108 (!) 103  Resp:  16 (!) 25 (!) 25  Temp:  98.3 F (36.8 C)    TempSrc:  Oral    SpO2:  100% 100% 100%  Weight: 116.6 kg     Height: 5\' 6"  (1.676 m)      Eyes: PERRL, lids and conjunctivae normal ENMT: Mucous membranes are moist. Posterior pharynx clear of any exudate or  lesions.Normal dentition.  Neck: normal, supple, no masses, no thyromegaly Respiratory: clear to auscultation bilaterally, no wheezing, no crackles. Normal respiratory effort. No accessory muscle use.  Cardiovascular: Regular rate and rhythm, no murmurs / rubs / gallops. No extremity edema. 2+ pedal pulses. No carotid bruits.  Abdomen: no tenderness, no masses palpated. No hepatosplenomegaly. Bowel sounds positive.  Musculoskeletal: no clubbing / cyanosis. No joint deformity upper and lower extremities. Good ROM, no contractures. Normal muscle tone.  Skin: no rashes, lesions, ulcers. No induration Neurologic: CN 2-12 grossly  intact. Sensation intact, DTR normal. Strength 5/5 in all 4.  Psychiatric: Normal judgment and insight. Alert and oriented x 3. Normal mood.    Labs on Admission: I have personally reviewed following labs and imaging studies  CBC: Recent Labs  Lab 03/27/19 1602  WBC 6.6  HGB 15.6*  HCT 45.5  MCV 88.5  PLT 317   Basic Metabolic Panel: Recent Labs  Lab 03/27/19 1602  NA 131*  K 4.3  CL 104  CO2 9*  GLUCOSE 357*  BUN 13  CREATININE 0.87  CALCIUM 8.8*   GFR: Estimated Creatinine Clearance: 123.8 mL/min (by C-G formula based on SCr of 0.87 mg/dL). Liver Function Tests: No results for input(s): AST, ALT, ALKPHOS, BILITOT, PROT, ALBUMIN in the last 168 hours. No results for input(s): LIPASE, AMYLASE in the last 168 hours. No results for input(s): AMMONIA in the last 168 hours. Coagulation Profile: No results for input(s): INR, PROTIME in the last 168 hours. Cardiac Enzymes: No results for input(s): CKTOTAL, CKMB, CKMBINDEX, TROPONINI in the last 168 hours. BNP (last 3 results) No results for input(s): PROBNP in the last 8760 hours. HbA1C: No results for input(s): HGBA1C in the last 72 hours. CBG: Recent Labs  Lab 03/27/19 1555 03/27/19 2128  GLUCAP 416* 316*   Lipid Profile: No results for input(s): CHOL, HDL, LDLCALC, TRIG, CHOLHDL, LDLDIRECT  in the last 72 hours. Thyroid Function Tests: No results for input(s): TSH, T4TOTAL, FREET4, T3FREE, THYROIDAB in the last 72 hours. Anemia Panel: No results for input(s): VITAMINB12, FOLATE, FERRITIN, TIBC, IRON, RETICCTPCT in the last 72 hours. Urine analysis:    Component Value Date/Time   COLORURINE STRAW (A) 03/27/2019 1602   APPEARANCEUR CLEAR (A) 03/27/2019 1602   APPEARANCEUR Hazy 05/13/2014 1323   LABSPEC 1.027 03/27/2019 1602   LABSPEC 1.008 05/13/2014 1323   PHURINE 5.0 03/27/2019 1602   GLUCOSEU >=500 (A) 03/27/2019 1602   GLUCOSEU Negative 05/13/2014 1323   HGBUR NEGATIVE 03/27/2019 1602   BILIRUBINUR NEGATIVE 03/27/2019 1602   BILIRUBINUR neg 11/08/2018 1156   BILIRUBINUR Negative 05/13/2014 1323   KETONESUR 80 (A) 03/27/2019 1602   PROTEINUR 100 (A) 03/27/2019 1602   UROBILINOGEN 0.2 08/25/2018 1529   NITRITE NEGATIVE 03/27/2019 1602   LEUKOCYTESUR NEGATIVE 03/27/2019 1602   LEUKOCYTESUR 2+ 05/13/2014 1323    Radiological Exams on Admission: DG Chest Portable 1 View  Result Date: 03/27/2019 CLINICAL DATA:  C/O hyperglycemia, tachypnea x 2-3 days EXAM: PORTABLE CHEST 1 VIEW COMPARISON:  Chest radiograph 11/23/2018 FINDINGS: Cardiomediastinal contours are within normal limits. The lungs are clear. No pneumothorax or large pleural effusion. No acute finding in the visualized skeleton. IMPRESSION: No active cardiopulmonary disease. Electronically Signed   By: Emmaline KluverNancy  Ballantyne M.D.   On: 03/27/2019 20:55     Assessment/Plan Principal Problem:   DKA (diabetic ketoacidoses) (HCC) ---IV insulin per DKA protocol --- IV normal saline to transition to D5 once blood sugar falls below 250 --- Transition to subcutaneous insulin once anion gap closes.    History of 2019 novel coronavirus disease (COVID-19) --- Patient has a history of Covid diagnosed on 03/01/2019 --- She has no acute respiratory symptoms     Andris BaumannHazel V Kaleeyah Cuffie MD [facility]   If 7PM-7AM,  please contact night-coverage [email or web address] Password [email or web address]  03/27/2019, 10:19 PM

## 2019-03-27 NOTE — ED Notes (Signed)
Pt c/o increased nausea while in the [place], protocol orders initiated.

## 2019-03-27 NOTE — ED Provider Notes (Signed)
[facility] Emergency Department Provider Note   ____________________________________________   First MD Initiated Contact with Patient 03/27/19 2017     (approximate)  I have reviewed the triage vital signs and the nursing notes.   HISTORY  Chief Complaint Hyperglycemia    HPI Laurie Ortiz is a 28 y.o. female with past medical history of diabetes presents to the ED complaining of hyperglycemia.  Patient reports that he she has been feeling increasingly bad over the past couple of days with nausea, mild upper abdominal pain, and "fast breathing".  She states that current symptoms are similar to when she had DKA in the past.  She states she has been compliant with her insulin regimen and denies any recent fevers.  She complains of pain in her bilateral flanks, but denies any dysuria or hematuria.  She had similar flank pain at the time of her prior episode of DKA.  She states she was diagnosed with COVID-19 last month, but that her symptoms related to this have since resolved.  Her blood glucose readings have been running "high" at home.        Past Medical History:  Diagnosis Date  . Complication of anesthesia    slow to wake after wisdom teeth extraction  . COVID-19 11/12/2018   tested (+) 11/16/18,  (-) 12/01/18  . Diabetes mellitus, type 2 (HCC)   . Family history of adverse reaction to anesthesia    Mother - slow to wake  . History of chlamydia 08/2015  . History of Papanicolaou smear of cervix 04/22/13; 08/30/15   NEG CT/GC NEG; NEG CT-POS/GC/TR NEG  . History of vaginitis   . Hypercholesteremia   . Obesity (BMI 30.0-34.9)     Patient Active Problem List   Diagnosis Date Noted  . History of 2019 novel coronavirus disease (COVID-19) 03/27/2019  . Chest pain 11/24/2018  . Uncontrolled diabetes mellitus with hyperglycemia (HCC) 11/24/2018  . COVID-19 virus infection 11/24/2018  . DKA (diabetic ketoacidoses) (HCC) 06/30/2018  . Chlamydia  09/18/2016  . Trichimoniasis 09/18/2016  . Gonorrhea 07/29/2016  . Cyst of right Bartholin's gland 07/29/2016  . Exposure to STD 07/21/2016    Past Surgical History:  Procedure Laterality Date  . NASAL SEPTOPLASTY W/ TURBINOPLASTY Bilateral 03/04/2019   Procedure: NASAL SEPTOPLASTY WITH SUBMUCOUS RESECTION OF NASAL TURBINATES;  Surgeon: Linus Salmons, MD;  Location: [facility];  Service: ENT;  Laterality: Bilateral;  Diabetic - insulin  . VAGINA SURGERY     age 73 or 56  . WISDOM TOOTH EXTRACTION      Prior to Admission medications   Medication Sig Start Date End Date Taking? Authorizing Provider  atorvastatin (LIPITOR) 40 MG tablet Take 40 mg by mouth daily.   Yes [provider]  Levonorgestrel-Ethinyl Estradiol (AMETHIA) 0.15-0.03 &0.01 MG tablet Take 1 tablet by mouth daily. 11/08/18  Yes Copland, Ilona Sorrel, PA-C  NOVOLOG FLEXPEN 100 UNIT/ML FlexPen 2 unit per 2 grams carbohydrates with meals/snacks plus sliding scale: 2 units per glucose 50 above 150. 03/05/19  Yes [provider]  TOUJEO SOLOSTAR 300 UNIT/ML SOPN Inject 20-25 Units into the skin at bedtime. Takes 22 at night 10:00 PM Patient taking differently: Inject 22 Units into the skin at bedtime.  07/02/18  Yes Mody, Patricia Pesa, MD  valACYclovir (VALTREX) 500 MG tablet Take 1 tablet (500 mg total) by mouth daily. Patient taking differently: Take 500 mg by mouth at bedtime.  11/16/18  Yes Copland, Ilona Sorrel, PA-C  HYDROcodone-Acetaminophen 5-300  MG TABS Take 1-2 tablets by mouth every 4 (four) hours as needed. Patient not taking: Reported on 03/27/2019 03/04/19   Linus SalmonsMcQueen, Chapman, MD  insulin lispro (HUMALOG KWIKPEN) 100 UNIT/ML KwikPen -Humalog 1 unit for every 10 grams of carbohydrate (with each meal and snack) -Meal time Humalog correction scale before meals (to be added to meal coverage): 141-180 mg/dL- 1 unit 540-981181-220 mg/dL- 2 units 191-478221-260 mg/dL- 3 units 295-621261-300 mg/dL- 4 units 308-657301-340 mg/dL- 5  units 846-962341-380 mg/dL- 6 units 952-841381-420 mg/dL- 7 units -Bedtime Humalog correction scale:             181-240 mg/dL-1 unit             324-401241-300 mg/dL-2 units             027-253301-360 mg/dL-3 units             664-403361-420 mg/dL-4 units  Check blood sugars 4-5 times a day.  Check first thing in the morning, before each meal and at bedtime Patient not taking: Reported on 03/27/2019 07/02/18   Adrian SaranMody, Sital, MD  sulfamethoxazole-trimethoprim (BACTRIM DS) 800-160 MG tablet Take 1 tablet by mouth 2 (two) times daily. Patient not taking: Reported on 03/27/2019 03/04/19   Linus SalmonsMcQueen, Chapman, MD    Allergies Penicillins and Amoxicillin  Family History  Problem Relation Age of Onset  . Hypertension Mother   . Heart disease Maternal Grandmother   . Breast cancer Paternal Grandmother        breast cancer in late 40s-died from breast cancer  . Prostate cancer Paternal Grandfather   . Dementia Paternal Grandfather   . Parkinson's disease Paternal Grandfather     Social History Social History   Tobacco Use  . Smoking status: Never Smoker  . Smokeless tobacco: Never Used  Substance Use Topics  . Alcohol use: Not Currently    Comment: OCC  . Drug use: No    Review of Systems  Constitutional: No fever/chills Eyes: No visual changes. ENT: No sore throat. Cardiovascular: Denies chest pain. Respiratory: Denies shortness of breath. Gastrointestinal: No abdominal pain.  Positive for nausea, no vomiting.  No diarrhea.  No constipation.  Positive for flank pain. Genitourinary: Negative for dysuria. Musculoskeletal: Negative for back pain. Skin: Negative for rash. Neurological: Negative for headaches, focal weakness or numbness.  ____________________________________________   PHYSICAL EXAM:  VITAL SIGNS: ED Triage Vitals  Enc Vitals Group     BP 03/27/19 1552 (!) 134/93     Pulse Rate 03/27/19 1552 (!) 117     Resp 03/27/19 1552 16     Temp 03/27/19 1552 98.3 F (36.8 C)     Temp Source  03/27/19 1552 Oral     SpO2 03/27/19 1552 100 %     Weight 03/27/19 1548 257 lb 0.9 oz (116.6 kg)     Height 03/27/19 1548 5\' 6"  (1.676 m)     Head Circumference --      Peak Flow --      Pain Score 03/27/19 1548 0     Pain Loc --      Pain Edu? --      Excl. in GC? --     Constitutional: Alert and oriented. Eyes: Conjunctivae are normal. Head: Atraumatic. Nose: No congestion/rhinnorhea. Mouth/Throat: Mucous membranes are moist. Neck: Normal ROM Cardiovascular: Tachycardic, regular rhythm. Grossly normal heart sounds. Respiratory: Normal respiratory effort, tachypneic.  No retractions. Lungs CTAB. Gastrointestinal: Soft and nontender. No distention.  No CVA tenderness bilaterally. Genitourinary: deferred Musculoskeletal: No lower  extremity tenderness nor edema. Neurologic:  Normal speech and language. No gross focal neurologic deficits are appreciated. Skin:  Skin is warm, dry and intact. No rash noted. Psychiatric: Mood and affect are normal. Speech and behavior are normal.  ____________________________________________   LABS (all labs ordered are listed, but only abnormal results are displayed)  Labs Reviewed  BASIC METABOLIC PANEL - Abnormal; Notable for the following components:      Result Value   Sodium 131 (*)    CO2 9 (*)    Glucose, Bld 357 (*)    Calcium 8.8 (*)    Anion gap 18 (*)    All other components within normal limits  CBC - Abnormal; Notable for the following components:   RBC 5.14 (*)    Hemoglobin 15.6 (*)    All other components within normal limits  URINALYSIS, COMPLETE (UACMP) WITH MICROSCOPIC - Abnormal; Notable for the following components:   Color, Urine STRAW (*)    APPearance CLEAR (*)    Glucose, UA >=500 (*)    Ketones, ur 80 (*)    Protein, ur 100 (*)    All other components within normal limits  GLUCOSE, CAPILLARY - Abnormal; Notable for the following components:   Glucose-Capillary 416 (*)    All other components within normal  limits  BLOOD GAS, VENOUS - Abnormal; Notable for the following components:   pH, Ven 7.04 (*)    pCO2, Ven 23 (*)    pO2, Ven 54.0 (*)    Bicarbonate 6.2 (*)    Acid-base deficit 23.0 (*)    All other components within normal limits  BETA-HYDROXYBUTYRIC ACID - Abnormal; Notable for the following components:   Beta-Hydroxybutyric Acid 7.79 (*)    All other components within normal limits  GLUCOSE, CAPILLARY - Abnormal; Notable for the following components:   Glucose-Capillary 316 (*)    All other components within normal limits  GLUCOSE, CAPILLARY - Abnormal; Notable for the following components:   Glucose-Capillary 259 (*)    All other components within normal limits  URINALYSIS, ROUTINE W REFLEX MICROSCOPIC  CBC  CREATININE, SERUM  BETA-HYDROXYBUTYRIC ACID  BETA-HYDROXYBUTYRIC ACID  HEMOGLOBIN A1C  BETA-HYDROXYBUTYRIC ACID  CBG MONITORING, ED  CBG MONITORING, ED  POC URINE PREG, ED  POCT PREGNANCY, URINE  CBG MONITORING, ED   ____________________________________________   PROCEDURES  Procedure(s) performed (including Critical Care):  .Critical Care Performed by: Chesley Noon, MD Authorized by: Chesley Noon, MD   Critical care provider statement:    Critical care time (minutes):  45   Critical care time was exclusive of:  Separately billable procedures and treating other patients and teaching time   Critical care was necessary to treat or prevent imminent or life-threatening deterioration of the following conditions:  Endocrine crisis   Critical care was time spent personally by me on the following activities:  Discussions with consultants, evaluation of patient's response to treatment, examination of patient, ordering and performing treatments and interventions, ordering and review of laboratory studies, ordering and review of radiographic studies, pulse oximetry, re-evaluation of patient's condition, obtaining history from patient or surrogate and review of old  charts   I assumed direction of critical care for this patient from another provider in my specialty: no       ____________________________________________   INITIAL IMPRESSION / ASSESSMENT AND PLAN / ED COURSE       28 year old female with history of type 2 diabetes presents to the ED for nausea, abdominal discomfort, and "fast breathing" similar  to prior episode of DKA.  BMP is consistent with DKA given hyperglycemia with increased anion gap and acidosis.  We will plan to hydrate with IV fluid bolus x2, start insulin drip.  Etiology of her DKA is not clear as she states she has been compliant with insulin regimen and there is no obvious source of infection.  Chest x-ray and UA are both unremarkable.  Patient does have positive COVID-19 testing from November 17, however she is now outside the infectious window and does not require repeat testing or isolation precautions.  She states all Covid related symptoms have resolved.  We will admit for further management of DKA.      ____________________________________________   FINAL CLINICAL IMPRESSION(S) / ED DIAGNOSES  Final diagnoses:  Diabetic ketoacidosis without coma associated with type 2 diabetes mellitus ([place])  Nausea     ED Discharge Orders    None       Note:  This document was prepared using Dragon voice recognition software and may include unintentional dictation errors.   Blake Divine, MD 03/28/19 [phone]

## 2019-03-28 DIAGNOSIS — E876 Hypokalemia: Secondary | ICD-10-CM | POA: Diagnosis not present

## 2019-03-28 DIAGNOSIS — Z8619 Personal history of other infectious and parasitic diseases: Secondary | ICD-10-CM

## 2019-03-28 DIAGNOSIS — E66813 Obesity, class 3: Secondary | ICD-10-CM | POA: Diagnosis present

## 2019-03-28 LAB — GLUCOSE, CAPILLARY
Glucose-Capillary: 116 mg/dL — ABNORMAL HIGH (ref 70–99)
Glucose-Capillary: 125 mg/dL — ABNORMAL HIGH (ref 70–99)
Glucose-Capillary: 128 mg/dL — ABNORMAL HIGH (ref 70–99)
Glucose-Capillary: 128 mg/dL — ABNORMAL HIGH (ref 70–99)
Glucose-Capillary: 137 mg/dL — ABNORMAL HIGH (ref 70–99)
Glucose-Capillary: 146 mg/dL — ABNORMAL HIGH (ref 70–99)
Glucose-Capillary: 150 mg/dL — ABNORMAL HIGH (ref 70–99)
Glucose-Capillary: 152 mg/dL — ABNORMAL HIGH (ref 70–99)
Glucose-Capillary: 155 mg/dL — ABNORMAL HIGH (ref 70–99)
Glucose-Capillary: 163 mg/dL — ABNORMAL HIGH (ref 70–99)
Glucose-Capillary: 173 mg/dL — ABNORMAL HIGH (ref 70–99)
Glucose-Capillary: 180 mg/dL — ABNORMAL HIGH (ref 70–99)
Glucose-Capillary: 180 mg/dL — ABNORMAL HIGH (ref 70–99)
Glucose-Capillary: 184 mg/dL — ABNORMAL HIGH (ref 70–99)
Glucose-Capillary: 190 mg/dL — ABNORMAL HIGH (ref 70–99)
Glucose-Capillary: 199 mg/dL — ABNORMAL HIGH (ref 70–99)
Glucose-Capillary: 219 mg/dL — ABNORMAL HIGH (ref 70–99)
Glucose-Capillary: 231 mg/dL — ABNORMAL HIGH (ref 70–99)
Glucose-Capillary: 247 mg/dL — ABNORMAL HIGH (ref 70–99)
Glucose-Capillary: 272 mg/dL — ABNORMAL HIGH (ref 70–99)

## 2019-03-28 LAB — BASIC METABOLIC PANEL
Anion gap: 16 — ABNORMAL HIGH (ref 5–15)
Anion gap: 10 (ref 5–15)
Anion gap: 8 (ref 5–15)
Anion gap: 8 (ref 5–15)
Anion gap: 11 (ref 5–15)
BUN: 9 mg/dL (ref 6–20)
BUN: 9 mg/dL (ref 6–20)
BUN: 9 mg/dL (ref 6–20)
BUN: 11 mg/dL (ref 6–20)
BUN: 10 mg/dL (ref 6–20)
CO2: 7 mmol/L — ABNORMAL LOW (ref 22–32)
CO2: 12 mmol/L — ABNORMAL LOW (ref 22–32)
CO2: 15 mmol/L — ABNORMAL LOW (ref 22–32)
CO2: 15 mmol/L — ABNORMAL LOW (ref 22–32)
CO2: 12 mmol/L — ABNORMAL LOW (ref 22–32)
Calcium: 8.2 mg/dL — ABNORMAL LOW (ref 8.9–10.3)
Calcium: 8.1 mg/dL — ABNORMAL LOW (ref 8.9–10.3)
Calcium: 8.2 mg/dL — ABNORMAL LOW (ref 8.9–10.3)
Calcium: 8.6 mg/dL — ABNORMAL LOW (ref 8.9–10.3)
Calcium: 8.5 mg/dL — ABNORMAL LOW (ref 8.9–10.3)
Chloride: 112 mmol/L — ABNORMAL HIGH (ref 98–111)
Chloride: 111 mmol/L (ref 98–111)
Chloride: 111 mmol/L (ref 98–111)
Chloride: 112 mmol/L — ABNORMAL HIGH (ref 98–111)
Chloride: 112 mmol/L — ABNORMAL HIGH (ref 98–111)
Creatinine, Ser: 0.83 mg/dL (ref 0.44–1.00)
Creatinine, Ser: 0.6 mg/dL (ref 0.44–1.00)
Creatinine, Ser: 0.66 mg/dL (ref 0.44–1.00)
Creatinine, Ser: 0.64 mg/dL (ref 0.44–1.00)
Creatinine, Ser: 0.55 mg/dL (ref 0.44–1.00)
GFR calc Af Amer: >60 mL/min (ref >60)
GFR calc Af Amer: >60 mL/min (ref >60)
GFR calc Af Amer: >60 mL/min (ref >60)
GFR calc Af Amer: >60 mL/min (ref >60)
GFR calc Af Amer: >60 mL/min (ref >60)
GFR calc non Af Amer: >60 mL/min (ref >60)
GFR calc non Af Amer: >60 mL/min (ref >60)
GFR calc non Af Amer: >60 mL/min (ref >60)
GFR calc non Af Amer: >60 mL/min (ref >60)
GFR calc non Af Amer: >60 mL/min (ref >60)
Glucose, Bld: 175 mg/dL — ABNORMAL HIGH (ref 70–99)
Glucose, Bld: 173 mg/dL — ABNORMAL HIGH (ref 70–99)
Glucose, Bld: 199 mg/dL — ABNORMAL HIGH (ref 70–99)
Glucose, Bld: 251 mg/dL — ABNORMAL HIGH (ref 70–99)
Glucose, Bld: 133 mg/dL — ABNORMAL HIGH (ref 70–99)
Potassium: 3.8 mmol/L (ref 3.5–5.1)
Potassium: 3.9 mmol/L (ref 3.5–5.1)
Potassium: 3.7 mmol/L (ref 3.5–5.1)
Potassium: 3 mmol/L — ABNORMAL LOW (ref 3.5–5.1)
Potassium: 3.5 mmol/L (ref 3.5–5.1)
Sodium: 135 mmol/L (ref 135–145)
Sodium: 133 mmol/L — ABNORMAL LOW (ref 135–145)
Sodium: 134 mmol/L — ABNORMAL LOW (ref 135–145)
Sodium: 135 mmol/L (ref 135–145)
Sodium: 135 mmol/L (ref 135–145)

## 2019-03-28 LAB — RESPIRATORY PANEL BY RT PCR (FLU A&B, COVID)
Influenza A by PCR: NEGATIVE
Influenza B by PCR: NEGATIVE
SARS Coronavirus 2 by RT PCR: NEGATIVE

## 2019-03-28 LAB — BETA-HYDROXYBUTYRIC ACID
Beta-Hydroxybutyric Acid: 6.81 mmol/L — ABNORMAL HIGH (ref 0.05–0.27)
Beta-Hydroxybutyric Acid: 2.59 mmol/L — ABNORMAL HIGH (ref 0.05–0.27)
Beta-Hydroxybutyric Acid: 1.17 mmol/L — ABNORMAL HIGH (ref 0.05–0.27)

## 2019-03-28 LAB — HEMOGLOBIN A1C
Hgb A1c MFr Bld: 10.2 % — ABNORMAL HIGH (ref 4.8–5.6)
Mean Plasma Glucose: 246.04 mg/dL

## 2019-03-28 MED ORDER — POTASSIUM CHLORIDE CRYS ER 20 MEQ PO TBCR
40.0000 meq | EXTENDED_RELEASE_TABLET | ORAL | Status: AC
  Administered 2019-03-28 – 2019-03-29 (×2): 40 meq via ORAL
  Filled 2019-03-28 (×2): qty 2

## 2019-03-28 NOTE — Progress Notes (Signed)
Inpatient Diabetes Program Recommendations  AACE/ADA: New Consensus Statement on Inpatient Glycemic Control (2015)  Target Ranges:  Prepandial:   less than 140 mg/dL      Peak postprandial:   less than 180 mg/dL (1-2 hours)      Critically ill patients:  140 - 180 mg/dL   Results for Laurie, Ortiz (MRN [id number]) as of 03/28/2019 07:22  Ref. Range 03/27/2019 15:55 03/27/2019 21:28 03/27/2019 22:53 03/28/2019 00:29 03/28/2019 02:27 03/28/2019 03:38 03/28/2019 05:03 03/28/2019 06:20  Glucose-Capillary Latest Ref Range: 70 - 99 mg/dL 416 (H) 316 (H)  IV Insulin Drip 259 (H)  IV Insulin Drip 180 (H)  IV Insulin Drip 199 (H)  IV Insulin Drip 184 (H)  IV Insulin Drip 173 (H)  IV Insulin Drip 152 (H)  IV Insulin Drip   Results for Laurie, Ortiz (MRN [id number]) as of 03/28/2019 07:22  Ref. Range 03/27/2019 16:02 03/28/2019 00:56  Beta-Hydroxybutyric Acid Latest Ref Range: 0.05 - 0.27 mmol/L 7.79 (H) 6.81 (H)   Results for Laurie, Ortiz (MRN [id number]) as of 03/28/2019 07:22  Ref. Range 07/01/2018 04:26  Hemoglobin A1C Latest Ref Range: 4.8 - 5.6 % 9.9 (H)  (237 mg/dl)    Admit with: DKA  History: Type 1 Diabetes, COVID + back in August  Home DM Meds: Toujeo 22 units QHS       Novolog 2 units per 2 grams carbohydrates       Novolog 2 units per glucose 50 >150 mg/dl  Current Orders: IV Insulin Drip      MD- Note 6:55am BMET shows acidosis not cleared yet (CO2 level 12, Anion Gap 10).  Please leave Patient on the IV Insulin Drip until pt's CO2 level is at least 20 or higher     Endocrinologist: Dr. Francetta Found with Laurie Ortiz seen by telemedicine visit 02/02/2019--Was instructed to take/do the following: 1. Limit carbohydrates to 45-60 grams per meal and 15-30 grams per snack. Use smart phone apps to be as accurate as possible for carbohydrate counts  2. Change Novolog to 1 unit per 2 grams carbohydrates with meals/snacks plus  sliding scale of 2 units per glucose 50 above 150 for pre-meal blood sugars only. 3. Continue Toujeo Max Solostar 22 units at bedtime. Do not adjust your dose based on evening blood sugars. 4. Blood sugars should be checked at least 4-5 times per day and 6-8 times per day.     --Will follow patient during hospitalization--  Wyn Quaker RN, MSN, CDE Diabetes Coordinator Inpatient Glycemic Control Team Team Pager: [phone] (8a-5p)

## 2019-03-28 NOTE — Progress Notes (Signed)
PROGRESS NOTE    Laurie Ortiz  JOA:[id number] DOB: November 24, 1990 DOA: 03/27/2019 PCP: Patient, No Pcp Per    Brief Narrative:  28 year old female with history of type 1 diabetes and previous admissions for DKA, admitted to the hospital with 2-day history of vomiting and weakness.  She did not have any cough, fever, chills or shortness of breath.  Blood sugar was noted to be in the 400s and anion gap of 18.  She was found to be in DKA and admitted for further treatments.   Assessment & Plan:   Principal Problem:   DKA (diabetic ketoacidoses) (HCC) Active Problems:   History of 2019 novel coronavirus disease (COVID-19)   Hypokalemia   Obesity, Class III, BMI 40-49.9 (morbid obesity) (HCC)   1. Diabetic ketoacidosis.  Continue management with intravenous fluids and intravenous insulin.  Keep the patient n.p.o. until she is ready to come off of insulin infusion.  Anion gap has improved, bicarb remains low.  Continue current treatments.  Once bicarb has improved, she will be transitioned back to her home dose of basal insulin.  She does not have any obvious source of infection at this time.  Clinically, she appears to be doing well. 2. Insulin-dependent diabetes, uncontrolled with hyperglycemia.  Patient reports that her blood sugars have been running high for quite some time.  She reports compliance with her medications.  She has been requiring increasing doses of basal insulin at home.  Monitor blood sugars when she has been transitioned off insulin infusion. 3. Hypokalemia.  Replace 4. Recent Covid infection.  Patient was diagnosed with COVID-19 in 11/2018.  She did not have any real respiratory symptoms at that time.  She did complain of loss of smell and taste.  She was retested in 02/2019 prior to an elective surgical procedure.  Again, she tested positive.  At this time, I have discussed with Dr. Rivka Safer, ID regarding repeat testing.  Recommendations are for rapid testing to be  performed.  If rapid testing is positive, the lab will need to be contacted to determine the cycle threshold for the test.  If cycle threshold is greater than 34, the patient will not need any special isolation.  If cycle threshold is less than 34, this would indicate that she is still infective and she will need to be placed on airborne precautions.  DVT prophylaxis: Lovenox Code Status: Full code Family Communication: None present Disposition Plan: Discharge home once improved   Consultants:     Procedures:     Antimicrobials:       Subjective: No further nausea, vomiting.  No cough, fever, shortness of breath.  Objective: Vitals:   03/28/19 1730 03/28/19 1745 03/28/19 1800 03/28/19 1815  BP: 126/76 121/76 121/76 124/82  Pulse: 91 91 95 99  Resp: 18 18 19 18   Temp:      TempSrc:      SpO2: 98% 98% 98% 99%  Weight:      Height:        Intake/Output Summary (Last 24 hours) at 03/28/2019 1941 Last data filed at 03/28/2019 0052 Gross per 24 hour  Intake 2000 ml  Output --  Net 2000 ml   Filed Weights   03/27/19 1548  Weight: 116.6 kg    Examination:  General exam: Appears calm and comfortable  Respiratory system: Clear to auscultation. Respiratory effort normal. Cardiovascular system: S1 & S2 heard, RRR. No JVD, murmurs, rubs, gallops or clicks. No pedal edema. Gastrointestinal system: Abdomen is nondistended, soft and  nontender. No organomegaly or masses felt. Normal bowel sounds heard. Central nervous system: Alert and oriented. No focal neurological deficits. Extremities: Symmetric 5 x 5 power. Skin: No rashes, lesions or ulcers Psychiatry: Judgement and insight appear normal. Mood & affect appropriate.     Data Reviewed: I have personally reviewed following labs and imaging studies  CBC: Recent Labs  Lab 03/27/19 1602  WBC 6.6  HGB 15.6*  HCT 45.5  MCV 88.5  PLT 676   Basic Metabolic Panel: Recent Labs  Lab 03/27/19 1602 03/28/19 0056  03/28/19 0655 03/28/19 1302 03/28/19 1710  NA 131* 135 133* 134* 135  K 4.3 3.8 3.9 3.7 3.0*  CL 104 112* 111 111 112*  CO2 9* 7* 12* 15* 15*  GLUCOSE 357* 175* 173* 199* 251*  BUN 13 9 9 9 11   CREATININE 0.87 0.83 0.60 0.66 0.64  CALCIUM 8.8* 8.2* 8.1* 8.2* 8.6*   GFR: Estimated Creatinine Clearance: 134.6 mL/min (by C-G formula based on SCr of 0.64 mg/dL). Liver Function Tests: No results for input(s): AST, ALT, ALKPHOS, BILITOT, PROT, ALBUMIN in the last 168 hours. No results for input(s): LIPASE, AMYLASE in the last 168 hours. No results for input(s): AMMONIA in the last 168 hours. Coagulation Profile: No results for input(s): INR, PROTIME in the last 168 hours. Cardiac Enzymes: No results for input(s): CKTOTAL, CKMB, CKMBINDEX, TROPONINI in the last 168 hours. BNP (last 3 results) No results for input(s): PROBNP in the last 8760 hours. HbA1C: Recent Labs    03/28/19 0056  HGBA1C 10.2*   CBG: Recent Labs  Lab 03/28/19 1450 03/28/19 1555 03/28/19 1700 03/28/19 1802 03/28/19 1914  GLUCAP 231* 247* 272* 219* 155*   Lipid Profile: No results for input(s): CHOL, HDL, LDLCALC, TRIG, CHOLHDL, LDLDIRECT in the last 72 hours. Thyroid Function Tests: No results for input(s): TSH, T4TOTAL, FREET4, T3FREE, THYROIDAB in the last 72 hours. Anemia Panel: No results for input(s): VITAMINB12, FOLATE, FERRITIN, TIBC, IRON, RETICCTPCT in the last 72 hours. Sepsis Labs: No results for input(s): PROCALCITON, LATICACIDVEN in the last 168 hours.  No results found for this or any previous visit (from the past 240 hour(s)).       Radiology Studies: DG Chest Portable 1 View  Result Date: 03/27/2019 CLINICAL DATA:  C/O hyperglycemia, tachypnea x 2-3 days EXAM: PORTABLE CHEST 1 VIEW COMPARISON:  Chest radiograph 11/23/2018 FINDINGS: Cardiomediastinal contours are within normal limits. The lungs are clear. No pneumothorax or large pleural effusion. No acute finding in the  visualized skeleton. IMPRESSION: No active cardiopulmonary disease. Electronically Signed   By: Audie Pinto M.D.   On: 03/27/2019 20:55        Scheduled Meds: . enoxaparin (LOVENOX) injection  40 mg Subcutaneous Q24H  . potassium chloride  40 mEq Oral Q3H   Continuous Infusions: . sodium chloride    . dextrose 5 % and 0.45% NaCl 75 mL/hr at 03/28/19 0800  . insulin Stopped (03/28/19 0800)  . insulin 6 Units/hr (03/28/19 1916)     LOS: 1 day    Time spent: 30mins    Kathie Dike, MD [facility]   If 7PM-7AM, please contact night-coverage [email or web address]  03/28/2019, 7:41 PM

## 2019-03-28 NOTE — Progress Notes (Addendum)
Home DM Meds: Toujeo 22 units QHS                             Novolog 2 units per 2 grams carbohydrates                             Novolog 2 units per glucose 50 >150 mg/dl  Current Orders: IV Insulin Drip    1pm BMET shows Anion Gap down to 8, however, CO2 level only 15.  Beta Hydroxybutyric Acid level 1.17 at 1:06pm--Still above normal     MD- Please wait to transition patient to SQ until CO2 closer to 20 and Beta Hydroxybutyric Acid level WNL.  When patient ready to transition to SQ Insulin, please make sure she receives her home dose of basal insulin at least 1-2 hours prior to d/c of the IV Insulin drip.  Recommend when patient ready: Lantus 22 units Daily --make sure to give 1-2 hours prior to d/c of drip Novolog Sensitive Correction Scale/ SSI (0-9 units) TID AC + HS Novolog 6 units TID with meals for meal coverage    --Will follow patient during hospitalization--  Wyn Quaker RN, MSN, CDE Diabetes Coordinator Inpatient Glycemic Control Team Team Pager: [phone] (8a-5p)

## 2019-03-28 NOTE — ED Notes (Signed)
Pt given lunch tray.

## 2019-03-28 NOTE — ED Notes (Signed)
Dr. Roderic Palau informed of increased BG and endo tool request for 25 units/hour of insulin to be administered. Ok to proceed with 25 units/hour to be administered.

## 2019-03-29 ENCOUNTER — Other Ambulatory Visit: Payer: Self-pay

## 2019-03-29 ENCOUNTER — Encounter: Payer: Self-pay | Admitting: Internal Medicine

## 2019-03-29 LAB — GLUCOSE, CAPILLARY
Glucose-Capillary: 166 mg/dL — ABNORMAL HIGH (ref 70–99)
Glucose-Capillary: 154 mg/dL — ABNORMAL HIGH (ref 70–99)
Glucose-Capillary: 139 mg/dL — ABNORMAL HIGH (ref 70–99)
Glucose-Capillary: 147 mg/dL — ABNORMAL HIGH (ref 70–99)
Glucose-Capillary: 145 mg/dL — ABNORMAL HIGH (ref 70–99)
Glucose-Capillary: 136 mg/dL — ABNORMAL HIGH (ref 70–99)
Glucose-Capillary: 172 mg/dL — ABNORMAL HIGH (ref 70–99)
Glucose-Capillary: 203 mg/dL — ABNORMAL HIGH (ref 70–99)
Glucose-Capillary: 186 mg/dL — ABNORMAL HIGH (ref 70–99)
Glucose-Capillary: 223 mg/dL — ABNORMAL HIGH (ref 70–99)
Glucose-Capillary: 256 mg/dL — ABNORMAL HIGH (ref 70–99)
Glucose-Capillary: 350 mg/dL — ABNORMAL HIGH (ref 70–99)

## 2019-03-29 LAB — BASIC METABOLIC PANEL
Anion gap: 8 (ref 5–15)
Anion gap: 8 (ref 5–15)
BUN: 10 mg/dL (ref 6–20)
BUN: 9 mg/dL (ref 6–20)
CO2: 16 mmol/L — ABNORMAL LOW (ref 22–32)
CO2: 17 mmol/L — ABNORMAL LOW (ref 22–32)
Calcium: 8.3 mg/dL — ABNORMAL LOW (ref 8.9–10.3)
Calcium: 8.6 mg/dL — ABNORMAL LOW (ref 8.9–10.3)
Chloride: 111 mmol/L (ref 98–111)
Chloride: 113 mmol/L — ABNORMAL HIGH (ref 98–111)
Creatinine, Ser: 0.56 mg/dL (ref 0.44–1.00)
Creatinine, Ser: 0.45 mg/dL (ref 0.44–1.00)
GFR calc Af Amer: >60 mL/min (ref >60)
GFR calc Af Amer: >60 mL/min (ref >60)
GFR calc non Af Amer: >60 mL/min (ref >60)
GFR calc non Af Amer: >60 mL/min (ref >60)
Glucose, Bld: 162 mg/dL — ABNORMAL HIGH (ref 70–99)
Glucose, Bld: 149 mg/dL — ABNORMAL HIGH (ref 70–99)
Potassium: 3.1 mmol/L — ABNORMAL LOW (ref 3.5–5.1)
Potassium: 3.4 mmol/L — ABNORMAL LOW (ref 3.5–5.1)
Sodium: 135 mmol/L (ref 135–145)
Sodium: 138 mmol/L (ref 135–145)

## 2019-03-29 MED ORDER — INSULIN ASPART 100 UNIT/ML ~~LOC~~ SOLN
0.0000 [IU] | Freq: Every day | SUBCUTANEOUS | Status: DC
  Administered 2019-03-29: 4 [IU] via SUBCUTANEOUS
  Filled 2019-03-29: qty 1

## 2019-03-29 MED ORDER — TAMSULOSIN HCL 0.4 MG PO CAPS: 0.4000 mg | ORAL_CAPSULE | Freq: Every day | ORAL | Status: DC

## 2019-03-29 MED ORDER — INFLUENZA VAC SPLIT QUAD 0.5 ML IM SUSY
0.5000 mL | PREFILLED_SYRINGE | INTRAMUSCULAR | Status: AC
  Administered 2019-03-30: 0.5 mL via INTRAMUSCULAR
  Filled 2019-03-29: qty 0.5

## 2019-03-29 MED ORDER — LACTATED RINGERS IV SOLN: INTRAVENOUS | Status: DC

## 2019-03-29 MED ORDER — DIPHENHYDRAMINE HCL 25 MG PO CAPS: 25.0000 mg | ORAL_CAPSULE | Freq: Four times a day (QID) | ORAL | Status: DC | PRN

## 2019-03-29 MED ORDER — INSULIN GLARGINE 100 UNIT/ML ~~LOC~~ SOLN
25.0000 [IU] | Freq: Every day | SUBCUTANEOUS | Status: DC
  Administered 2019-03-29 – 2019-03-30 (×2): 25 [IU] via SUBCUTANEOUS
  Filled 2019-03-29 (×3): qty 0.25

## 2019-03-29 MED ORDER — INSULIN ASPART 100 UNIT/ML ~~LOC~~ SOLN
6.0000 [IU] | Freq: Three times a day (TID) | SUBCUTANEOUS | Status: DC
  Administered 2019-03-29 – 2019-03-30 (×3): 6 [IU] via SUBCUTANEOUS
  Filled 2019-03-29 (×3): qty 1

## 2019-03-29 MED ORDER — VALACYCLOVIR HCL 500 MG PO TABS
500.0000 mg | ORAL_TABLET | Freq: Every day | ORAL | Status: DC
  Filled 2019-03-29: qty 1

## 2019-03-29 MED ORDER — VALACYCLOVIR HCL 500 MG PO TABS
500.0000 mg | ORAL_TABLET | Freq: Every day | ORAL | Status: DC
  Administered 2019-03-29: 21:00:00 500 mg via ORAL
  Filled 2019-03-29 (×2): qty 1

## 2019-03-29 MED ORDER — POTASSIUM CHLORIDE CRYS ER 20 MEQ PO TBCR
40.0000 meq | EXTENDED_RELEASE_TABLET | Freq: Once | ORAL | Status: AC
  Administered 2019-03-29: 40 meq via ORAL
  Filled 2019-03-29: qty 2

## 2019-03-29 MED ORDER — INSULIN ASPART 100 UNIT/ML ~~LOC~~ SOLN
0.0000 [IU] | Freq: Three times a day (TID) | SUBCUTANEOUS | Status: DC
  Administered 2019-03-29: 5 [IU] via SUBCUTANEOUS
  Administered 2019-03-29 – 2019-03-30 (×2): 3 [IU] via SUBCUTANEOUS
  Administered 2019-03-30: 09:00:00 7 [IU] via SUBCUTANEOUS
  Filled 2019-03-29 (×4): qty 1

## 2019-03-29 MED ORDER — DIPHENHYDRAMINE HCL 25 MG PO CAPS
25.0000 mg | ORAL_CAPSULE | Freq: Once | ORAL | Status: AC
  Administered 2019-03-29: 25 mg via ORAL
  Filled 2019-03-29: qty 1

## 2019-03-29 NOTE — ED Notes (Signed)
Pharmacy notified to send Lantus dose. 

## 2019-03-29 NOTE — ED Notes (Signed)
ED TO INPATIENT HANDOFF REPORT  ED Nurse Name and Phone #: Morrie Sheldon, RN 54  S Name/Age/Gender Laurie Ortiz 28 y.o. female Room/Bed: ED18A/ED18A  Code Status   Code Status: Full Code  Home/SNF/Other Home Patient oriented to: self, place, time and situation Is this baseline? Yes   Triage Complete: Triage complete  Chief Complaint DKA (diabetic ketoacidoses) (HCC) [E11.10]  Triage Note C/O hyperglycemia, tachypnea x 2-3 days. Patient states "I feel like I'm in DKA again"  AAOx3.  Skin warm and dry. NAD    Allergies Allergies  Allergen Reactions  . Penicillins Hives and Rash    Did it involve swelling of the face/tongue/throat, SOB, or low BP? No Did it involve sudden or severe rash/hives, skin peeling, or any reaction on the inside of your mouth or nose? Yes Did you need to seek medical attention at a hospital or doctor's office? No When did it last happen?1998 If all above answers are "NO", may proceed with cephalosporin use.  Doesn't take any medications ending in "cillin"  . Amoxicillin Hives and Rash    (rash 09/1996)    Level of Care/Admitting Diagnosis ED Disposition    ED Disposition Condition Comment   Admit  Hospital Area: [facility] [[id number]]  Level of Care: Med-Surg [16]  Covid Evaluation: Confirmed COVID Negative  Diagnosis: DKA (diabetic ketoacidoses) [facility]) [[id number]]  Admitting Physician: Erick Blinks [[id number]]  Attending Physician: Erick Blinks [[id number]]  Estimated length of stay: past midnight tomorrow  Certification:: I certify this patient will need inpatient services for at least 2 midnights       B Medical/Surgery History Past Medical History:  Diagnosis Date  . Complication of anesthesia    slow to wake after wisdom teeth extraction  . COVID-19 11/12/2018   tested (+) 11/16/18,  (-) 12/01/18  . Diabetes mellitus, type 2 (HCC)   . Family history of adverse reaction to anesthesia    Mother - slow to wake  .  History of chlamydia 08/2015  . History of Papanicolaou smear of cervix 04/22/13; 08/30/15   NEG CT/GC NEG; NEG CT-POS/GC/TR NEG  . History of vaginitis   . Hypercholesteremia   . Obesity (BMI 30.0-34.9)    Past Surgical History:  Procedure Laterality Date  . NASAL SEPTOPLASTY W/ TURBINOPLASTY Bilateral 03/04/2019   Procedure: NASAL SEPTOPLASTY WITH SUBMUCOUS RESECTION OF NASAL TURBINATES;  Surgeon: Linus Salmons, MD;  Location: [facility];  Service: ENT;  Laterality: Bilateral;  Diabetic - insulin  . VAGINA SURGERY     age 50 or 77  . WISDOM TOOTH EXTRACTION       A IV Location/Drains/Wounds Patient Lines/Drains/Airways Status   Active Line/Drains/Airways    Name:   Placement date:   Placement time:   Site:   Days:   Peripheral IV 01/15/19 Right Antecubital   01/15/19    2140    Antecubital   73   Peripheral IV 03/27/19 Right Forearm   03/27/19    1601    Forearm   2   Peripheral IV 03/27/19 Left Arm   03/27/19    2233    Arm   2   Peripheral IV 03/29/19 Left Forearm   03/29/19    0637    Forearm   less than 1   Incision (Closed) 03/04/19 Nose Other (Comment)   03/04/19    1229     25          Intake/Output Last 24 hours No intake or output  data in the 24 hours ending 03/29/19 1248  Labs/Imaging Results for orders placed or performed during the hospital encounter of 03/27/19 (from the past 48 hour(s))  Glucose, capillary     Status: Abnormal   Collection Time: 03/27/19  3:55 PM  Result Value Ref Range   Glucose-Capillary 416 (H) 70 - 99 mg/dL  Basic metabolic panel     Status: Abnormal   Collection Time: 03/27/19  4:02 PM  Result Value Ref Range   Sodium 131 (L) 135 - 145 mmol/L   Potassium 4.3 3.5 - 5.1 mmol/L   Chloride 104 98 - 111 mmol/L   CO2 9 (L) 22 - 32 mmol/L   Glucose, Bld 357 (H) 70 - 99 mg/dL   BUN 13 6 - 20 mg/dL   Creatinine, Ser 1.61 0.44 - 1.00 mg/dL   Calcium 8.8 (L) 8.9 - 10.3 mg/dL   GFR calc non Af Amer >60 >60 mL/min   GFR calc Af  Amer >60 >60 mL/min   Anion gap 18 (H) 5 - 15    Comment: Performed at [facility], [address] [place], [place] [address]  CBC     Status: Abnormal   Collection Time: 03/27/19  4:02 PM  Result Value Ref Range   WBC 6.6 4.0 - 10.5 K/uL   RBC 5.14 (H) 3.87 - 5.11 MIL/uL   Hemoglobin 15.6 (H) 12.0 - 15.0 g/dL   HCT 54.0 98.1 - 19.1 %   MCV 88.5 80.0 - 100.0 fL   MCH 30.4 26.0 - 34.0 pg   MCHC 34.3 30.0 - 36.0 g/dL   RDW 47.8 29.5 - 62.1 %   Platelets 317 150 - 400 K/uL   nRBC 0.0 0.0 - 0.2 %    Comment: Performed at [facility], [address] [place], [place] [address]  Urinalysis, Complete w Microscopic     Status: Abnormal   Collection Time: 03/27/19  4:02 PM  Result Value Ref Range   Color, Urine STRAW (A) YELLOW   APPearance CLEAR (A) CLEAR   Specific Gravity, Urine 1.027 1.005 - 1.030   pH 5.0 5.0 - 8.0   Glucose, UA >=500 (A) NEGATIVE mg/dL   Hgb urine dipstick NEGATIVE NEGATIVE   Bilirubin Urine NEGATIVE NEGATIVE   Ketones, ur 80 (A) NEGATIVE mg/dL   Protein, ur 784 (A) NEGATIVE mg/dL   Nitrite NEGATIVE NEGATIVE   Leukocytes,Ua NEGATIVE NEGATIVE   RBC / HPF 0-5 0 - 5 RBC/hpf   WBC, UA NONE SEEN 0 - 5 WBC/hpf   Bacteria, UA NONE SEEN NONE SEEN   Squamous Epithelial / LPF 0-5 0 - 5   Mucus PRESENT     Comment: Performed at [facility], [address] [place], [place] [address]  Beta-hydroxybutyric acid     Status: Abnormal   Collection Time: 03/27/19  4:02 PM  Result Value Ref Range   Beta-Hydroxybutyric Acid 7.79 (H) 0.05 - 0.27 mmol/L    Comment: RESULT CONFIRMED BY MANUAL DILUTION [facility] Performed at [facility], [address] [place], [place] [address]   Pregnancy, urine POC     Status: None   Collection Time: 03/27/19  4:16 PM  Result Value Ref Range   Preg Test, Ur NEGATIVE NEGATIVE    Comment:        THE SENSITIVITY OF THIS METHODOLOGY IS >24 mIU/mL   Blood gas, venous     Status: Abnormal   Collection  Time: 03/27/19  8:34 PM  Result  Value Ref Range   pH, Ven 7.04 (LL) 7.250 - 7.430    Comment: CRITICAL RESULT CALLED TO, READ BACK BY AND VERIFIED WITH: JESSUP, MD AT 2245 ON 03/27/19 CMH,RRT    pCO2, Ven 23 (L) 44.0 - 60.0 mmHg   pO2, Ven 54.0 (H) 32.0 - 45.0 mmHg   Bicarbonate 6.2 (L) 20.0 - 28.0 mmol/L   Acid-base deficit 23.0 (H) 0.0 - 2.0 mmol/L   O2 Saturation 68.9 %   Patient temperature 37.0    Collection site VEIN    Sample type VENOUS     Comment: Performed at [facility], [address] [place], [place] [address]  Glucose, capillary     Status: Abnormal   Collection Time: 03/27/19  9:28 PM  Result Value Ref Range   Glucose-Capillary 316 (H) 70 - 99 mg/dL  Glucose, capillary     Status: Abnormal   Collection Time: 03/27/19 10:53 PM  Result Value Ref Range   Glucose-Capillary 259 (H) 70 - 99 mg/dL  Glucose, capillary     Status: Abnormal   Collection Time: 03/28/19 12:29 AM  Result Value Ref Range   Glucose-Capillary 180 (H) 70 - 99 mg/dL  Beta-hydroxybutyric acid     Status: Abnormal   Collection Time: 03/28/19 12:56 AM  Result Value Ref Range   Beta-Hydroxybutyric Acid 6.81 (H) 0.05 - 0.27 mmol/L    Comment: RESULT CONFIRMED BY MANUAL DILUTION [facility] Performed at [facility], [address] [place], [place] [address]   Hemoglobin A1c     Status: Abnormal   Collection Time: 03/28/19 12:56 AM  Result Value Ref Range   Hgb A1c MFr Bld 10.2 (H) 4.8 - 5.6 %    Comment: (NOTE) Pre diabetes:          5.7%-6.4% Diabetes:              >6.4% Glycemic control for   <7.0% adults with diabetes    Mean Plasma Glucose 246.04 mg/dL    Comment: Performed at [facility], [address] [place], [place] [address]  Basic metabolic panel     Status: Abnormal   Collection Time: 03/28/19 12:56 AM  Result Value Ref Range   Sodium 135 135 - 145 mmol/L   Potassium 3.8 3.5 - 5.1 mmol/L   Chloride 112 (H) 98 - 111 mmol/L   CO2 7 (L) 22 - 32 mmol/L    Glucose, Bld 175 (H) 70 - 99 mg/dL   BUN 9 6 - 20 mg/dL   Creatinine, Ser 9.140.83 0.44 - 1.00 mg/dL   Calcium 8.2 (L) 8.9 - 10.3 mg/dL   GFR calc non Af Amer >60 >60 mL/min   GFR calc Af Amer >60 >60 mL/min   Anion gap 16 (H) 5 - 15    Comment: Performed at [facility], [address] [place], [place] [address]  Glucose, capillary     Status: Abnormal   Collection Time: 03/28/19  2:27 AM  Result Value Ref Range   Glucose-Capillary 199 (H) 70 - 99 mg/dL  Glucose, capillary     Status: Abnormal   Collection Time: 03/28/19  3:38 AM  Result Value Ref Range   Glucose-Capillary 184 (H) 70 - 99 mg/dL  Glucose, capillary     Status: Abnormal   Collection Time: 03/28/19  5:03 AM  Result Value Ref Range   Glucose-Capillary 173 (H) 70 - 99 mg/dL  Glucose, capillary     Status: Abnormal   Collection Time:  03/28/19  6:20 AM  Result Value Ref Range   Glucose-Capillary 152 (H) 70 - 99 mg/dL  Beta-hydroxybutyric acid     Status: Abnormal   Collection Time: 03/28/19  6:55 AM  Result Value Ref Range   Beta-Hydroxybutyric Acid 2.59 (H) 0.05 - 0.27 mmol/L    Comment: Performed at [facility], [address] [place], [place] [address]  Basic metabolic panel     Status: Abnormal   Collection Time: 03/28/19  6:55 AM  Result Value Ref Range   Sodium 133 (L) 135 - 145 mmol/L   Potassium 3.9 3.5 - 5.1 mmol/L   Chloride 111 98 - 111 mmol/L   CO2 12 (L) 22 - 32 mmol/L   Glucose, Bld 173 (H) 70 - 99 mg/dL   BUN 9 6 - 20 mg/dL   Creatinine, Ser 6.04 0.44 - 1.00 mg/dL   Calcium 8.1 (L) 8.9 - 10.3 mg/dL   GFR calc non Af Amer >60 >60 mL/min   GFR calc Af Amer >60 >60 mL/min   Anion gap 10 5 - 15    Comment: Performed at [facility], [address] [place], [place] [address]  Glucose, capillary     Status: Abnormal   Collection Time: 03/28/19  7:52 AM  Result Value Ref Range   Glucose-Capillary 137 (H) 70 - 99 mg/dL  Glucose, capillary     Status: Abnormal    Collection Time: 03/28/19  9:16 AM  Result Value Ref Range   Glucose-Capillary 150 (H) 70 - 99 mg/dL  Glucose, capillary     Status: Abnormal   Collection Time: 03/28/19 10:20 AM  Result Value Ref Range   Glucose-Capillary 146 (H) 70 - 99 mg/dL  Glucose, capillary     Status: Abnormal   Collection Time: 03/28/19 11:30 AM  Result Value Ref Range   Glucose-Capillary 180 (H) 70 - 99 mg/dL  Glucose, capillary     Status: Abnormal   Collection Time: 03/28/19 12:39 PM  Result Value Ref Range   Glucose-Capillary 190 (H) 70 - 99 mg/dL  Basic metabolic panel     Status: Abnormal   Collection Time: 03/28/19  1:02 PM  Result Value Ref Range   Sodium 134 (L) 135 - 145 mmol/L   Potassium 3.7 3.5 - 5.1 mmol/L   Chloride 111 98 - 111 mmol/L   CO2 15 (L) 22 - 32 mmol/L   Glucose, Bld 199 (H) 70 - 99 mg/dL   BUN 9 6 - 20 mg/dL   Creatinine, Ser 1.19 0.44 - 1.00 mg/dL   Calcium 8.2 (L) 8.9 - 10.3 mg/dL   GFR calc non Af Amer >60 >60 mL/min   GFR calc Af Amer >60 >60 mL/min   Anion gap 8 5 - 15    Comment: Performed at [facility], [address] [place], [place] [address]  Beta-hydroxybutyric acid     Status: Abnormal   Collection Time: 03/28/19  1:06 PM  Result Value Ref Range   Beta-Hydroxybutyric Acid 1.17 (H) 0.05 - 0.27 mmol/L    Comment: Performed at [facility], [address] [place], [place] [address]  Glucose, capillary     Status: Abnormal   Collection Time: 03/28/19  1:45 PM  Result Value Ref Range   Glucose-Capillary 163 (H) 70 - 99 mg/dL  Glucose, capillary     Status: Abnormal   Collection Time: 03/28/19  2:50 PM  Result Value Ref Range   Glucose-Capillary 231 (H) 70 - 99  mg/dL  Glucose, capillary     Status: Abnormal   Collection Time: 03/28/19  3:55 PM  Result Value Ref Range   Glucose-Capillary 247 (H) 70 - 99 mg/dL  Glucose, capillary     Status: Abnormal   Collection Time: 03/28/19  5:00 PM  Result Value Ref Range   Glucose-Capillary 272 (H)  70 - 99 mg/dL  Basic metabolic panel     Status: Abnormal   Collection Time: 03/28/19  5:10 PM  Result Value Ref Range   Sodium 135 135 - 145 mmol/L   Potassium 3.0 (L) 3.5 - 5.1 mmol/L   Chloride 112 (H) 98 - 111 mmol/L   CO2 15 (L) 22 - 32 mmol/L   Glucose, Bld 251 (H) 70 - 99 mg/dL   BUN 11 6 - 20 mg/dL   Creatinine, Ser 1.61 0.44 - 1.00 mg/dL   Calcium 8.6 (L) 8.9 - 10.3 mg/dL   GFR calc non Af Amer >60 >60 mL/min   GFR calc Af Amer >60 >60 mL/min   Anion gap 8 5 - 15    Comment: Performed at [facility], [address] [place], [place] [address]  Glucose, capillary     Status: Abnormal   Collection Time: 03/28/19  6:02 PM  Result Value Ref Range   Glucose-Capillary 219 (H) 70 - 99 mg/dL  Glucose, capillary     Status: Abnormal   Collection Time: 03/28/19  7:14 PM  Result Value Ref Range   Glucose-Capillary 155 (H) 70 - 99 mg/dL  Respiratory Panel by RT PCR (Flu A&B, Covid) - Nasopharyngeal Swab     Status: None   Collection Time: 03/28/19  7:16 PM   Specimen: Nasopharyngeal Swab  Result Value Ref Range   SARS Coronavirus 2 by RT PCR NEGATIVE NEGATIVE    Comment: (NOTE) SARS-CoV-2 target nucleic acids are NOT DETECTED. The SARS-CoV-2 RNA is generally detectable in upper respiratoy specimens during the acute phase of infection. The lowest concentration of SARS-CoV-2 viral copies this assay can detect is 131 copies/mL. A negative result does not preclude SARS-Cov-2 infection and should not be used as the sole basis for treatment or other patient management decisions. A negative result may occur with  improper specimen collection/handling, submission of specimen other than nasopharyngeal swab, presence of viral mutation(s) within the areas targeted by this assay, and inadequate number of viral copies (<131 copies/mL). A negative result must be combined with clinical observations, patient history, and epidemiological information. The expected result is  Negative. Fact Sheet for Patients:  https://www.moore.com/ Fact Sheet for Healthcare Providers:  https://www.young.biz/ This test is not yet ap proved or cleared by the [place] FDA and  has been authorized for detection and/or diagnosis of SARS-CoV-2 by FDA under an Emergency Use Authorization (EUA). This EUA will remain  in effect (meaning this test can be used) for the duration of the COVID-19 declaration under Section 564(b)(1) of the Act, 21 U.S.C. section 360bbb-3(b)(1), unless the authorization is terminated or revoked sooner.    Influenza A by PCR NEGATIVE NEGATIVE   Influenza B by PCR NEGATIVE NEGATIVE    Comment: (NOTE) The Xpert Xpress SARS-CoV-2/FLU/RSV assay is intended as an aid in  the diagnosis of influenza from Nasopharyngeal swab specimens and  should not be used as a sole basis for treatment. Nasal washings and  aspirates are unacceptable for Xpert Xpress SARS-CoV-2/FLU/RSV  testing. Fact Sheet for Patients: https://www.moore.com/ Fact Sheet for Healthcare Providers: https://www.young.biz/ This test is not yet approved or  cleared by the [place] and  has been authorized for detection and/or diagnosis of SARS-CoV-2 by  FDA under an Emergency Use Authorization (EUA). This EUA will remain  in effect (meaning this test can be used) for the duration of the  Covid-19 declaration under Section 564(b)(1) of the Act, 21  U.S.C. section 360bbb-3(b)(1), unless the authorization is  terminated or revoked. Performed at [facility], [address] [place], [place] [address]   Glucose, capillary     Status: Abnormal   Collection Time: 03/28/19  8:03 PM  Result Value Ref Range   Glucose-Capillary 125 (H) 70 - 99 mg/dL  Basic metabolic panel     Status: Abnormal   Collection Time: 03/28/19  8:37 PM  Result Value Ref Range   Sodium 135 135 - 145 mmol/L   Potassium 3.5 3.5  - 5.1 mmol/L   Chloride 112 (H) 98 - 111 mmol/L   CO2 12 (L) 22 - 32 mmol/L   Glucose, Bld 133 (H) 70 - 99 mg/dL   BUN 10 6 - 20 mg/dL   Creatinine, Ser 0.55 0.44 - 1.00 mg/dL   Calcium 8.5 (L) 8.9 - 10.3 mg/dL   GFR calc non Af Amer >60 >60 mL/min   GFR calc Af Amer >60 >60 mL/min   Anion gap 11 5 - 15    Comment: Performed at [facility], [address] [place], [place] [address]  Glucose, capillary     Status: Abnormal   Collection Time: 03/28/19  9:08 PM  Result Value Ref Range   Glucose-Capillary 128 (H) 70 - 99 mg/dL  Glucose, capillary     Status: Abnormal   Collection Time: 03/28/19 10:11 PM  Result Value Ref Range   Glucose-Capillary 128 (H) 70 - 99 mg/dL  Glucose, capillary     Status: Abnormal   Collection Time: 03/28/19 11:24 PM  Result Value Ref Range   Glucose-Capillary 116 (H) 70 - 99 mg/dL  Basic metabolic panel     Status: Abnormal   Collection Time: 03/29/19 12:37 AM  Result Value Ref Range   Sodium 135 135 - 145 mmol/L   Potassium 3.1 (L) 3.5 - 5.1 mmol/L   Chloride 111 98 - 111 mmol/L   CO2 16 (L) 22 - 32 mmol/L   Glucose, Bld 162 (H) 70 - 99 mg/dL   BUN 10 6 - 20 mg/dL   Creatinine, Ser 0.56 0.44 - 1.00 mg/dL   Calcium 8.3 (L) 8.9 - 10.3 mg/dL   GFR calc non Af Amer >60 >60 mL/min   GFR calc Af Amer >60 >60 mL/min   Anion gap 8 5 - 15    Comment: Performed at [facility], [address] [place], [place] [address]  Glucose, capillary     Status: Abnormal   Collection Time: 03/29/19  1:13 AM  Result Value Ref Range   Glucose-Capillary 166 (H) 70 - 99 mg/dL  Glucose, capillary     Status: Abnormal   Collection Time: 03/29/19  2:23 AM  Result Value Ref Range   Glucose-Capillary 154 (H) 70 - 99 mg/dL  Glucose, capillary     Status: Abnormal   Collection Time: 03/29/19  4:04 AM  Result Value Ref Range   Glucose-Capillary 139 (H) 70 - 99 mg/dL  Glucose, capillary     Status: Abnormal   Collection Time: 03/29/19  5:12 AM  Result  Value Ref Range   Glucose-Capillary 147 (H) 70 - 99 mg/dL  Glucose,  capillary     Status: Abnormal   Collection Time: 03/29/19  6:08 AM  Result Value Ref Range   Glucose-Capillary 145 (H) 70 - 99 mg/dL  Basic metabolic panel     Status: Abnormal   Collection Time: 03/29/19  6:19 AM  Result Value Ref Range   Sodium 138 135 - 145 mmol/L   Potassium 3.4 (L) 3.5 - 5.1 mmol/L   Chloride 113 (H) 98 - 111 mmol/L   CO2 17 (L) 22 - 32 mmol/L   Glucose, Bld 149 (H) 70 - 99 mg/dL   BUN 9 6 - 20 mg/dL   Creatinine, Ser 1.03 0.44 - 1.00 mg/dL   Calcium 8.6 (L) 8.9 - 10.3 mg/dL   GFR calc non Af Amer >60 >60 mL/min   GFR calc Af Amer >60 >60 mL/min   Anion gap 8 5 - 15    Comment: Performed at [facility], [address] [place], [place] [address]  Glucose, capillary     Status: Abnormal   Collection Time: 03/29/19  7:45 AM  Result Value Ref Range   Glucose-Capillary 136 (H) 70 - 99 mg/dL  Glucose, capillary     Status: Abnormal   Collection Time: 03/29/19  9:45 AM  Result Value Ref Range   Glucose-Capillary 172 (H) 70 - 99 mg/dL  Glucose, capillary     Status: Abnormal   Collection Time: 03/29/19 10:54 AM  Result Value Ref Range   Glucose-Capillary 203 (H) 70 - 99 mg/dL  Glucose, capillary     Status: Abnormal   Collection Time: 03/29/19 12:15 PM  Result Value Ref Range   Glucose-Capillary 186 (H) 70 - 99 mg/dL   DG Chest Portable 1 View  Result Date: 03/27/2019 CLINICAL DATA:  C/O hyperglycemia, tachypnea x 2-3 days EXAM: PORTABLE CHEST 1 VIEW COMPARISON:  Chest radiograph 11/23/2018 FINDINGS: Cardiomediastinal contours are within normal limits. The lungs are clear. No pneumothorax or large pleural effusion. No acute finding in the visualized skeleton. IMPRESSION: No active cardiopulmonary disease. Electronically Signed   By: Emmaline Kluver M.D.   On: 03/27/2019 20:55    Pending Labs Unresulted Labs (From admission, onward)    Start     Ordered   04/03/19 0500   Creatinine, serum  (enoxaparin (LOVENOX)    CrCl >/= 30 ml/min)  Weekly,   STAT    Comments: while on enoxaparin therapy    03/27/19 2106   03/30/19 0500  Basic metabolic panel  Daily,   STAT     03/29/19 0922          Vitals/Pain Today's Vitals   03/29/19 0900 03/29/19 1000 03/29/19 1100 03/29/19 1200  BP: (!) 107/58 131/85 118/77 117/67  Pulse: 73 90 85 82  Resp: 15 16 15 16   Temp:      TempSrc:      SpO2: 99% 99% 100% 97%  Weight:      Height:      PainSc:        Isolation Precautions No active isolations  Medications Medications  dextrose 50 % solution 0-50 mL (has no administration in time range)  insulin regular, human (MYXREDLIN) 100 units/ 100 mL infusion ( Intravenous Stopped 03/28/19 0800)  enoxaparin (LOVENOX) injection 40 mg (40 mg Subcutaneous Given 03/28/19 2234)  insulin regular, human (MYXREDLIN) 100 units/ 100 mL infusion (6 Units/hr Intravenous Rate/Dose Change 03/29/19 1216)  dextrose 50 % solution 0-50 mL (has no administration in time range)  acetaminophen-codeine (TYLENOL #3) 300-30 MG per  tablet 1-2 tablet (1 tablet Oral Given 03/27/19 2147)  lactated ringers infusion (has no administration in time range)  insulin glargine (LANTUS) injection 25 Units (25 Units Subcutaneous Given 03/29/19 1050)  insulin aspart (novoLOG) injection 0-5 Units (has no administration in time range)  insulin aspart (novoLOG) injection 6 Units (has no administration in time range)  insulin aspart (novoLOG) injection 0-9 Units (has no administration in time range)  diphenhydrAMINE (BENADRYL) capsule 25 mg (has no administration in time range)  tamsulosin (FLOMAX) capsule 0.4 mg (has no administration in time range)  ondansetron (ZOFRAN) injection 4 mg (4 mg Intravenous Given 03/27/19 1834)  lactated ringers bolus 1,000 mL (0 mLs Intravenous Stopped 03/28/19 0052)  lactated ringers bolus 1,000 mL (0 mLs Intravenous Stopped 03/28/19 0052)  potassium chloride 10 mEq in 100 mL  IVPB (0 mEq Intravenous Stopped 03/28/19 0400)  potassium chloride SA (KLOR-CON) CR tablet 40 mEq (40 mEq Oral Given 03/29/19 0115)  potassium chloride SA (KLOR-CON) CR tablet 40 mEq (40 mEq Oral Given 03/29/19 1002)  diphenhydrAMINE (BENADRYL) capsule 25 mg (25 mg Oral Given 03/29/19 1002)    Mobility walks Low fall risk   Focused Assessments N/A   R Recommendations: See Admitting Provider Note  Report given to:   Additional Notes: N/A

## 2019-03-29 NOTE — ED Notes (Signed)
This tech transferred pt to room 148 via wheelchair.

## 2019-03-29 NOTE — Progress Notes (Addendum)
Inpatient Diabetes Program Recommendations  AACE/ADA: New Consensus Statement on Inpatient Glycemic Control   Target Ranges:  Prepandial:   less than 140 mg/dL      Peak postprandial:   less than 180 mg/dL (1-2 hours)      Critically ill patients:  140 - 180 mg/dL  Results for Laurie Ortiz, Laurie Ortiz (MRN [id number]) as of 03/29/2019 06:59  Ref. Range 03/28/2019 23:24 03/29/2019 01:13 03/29/2019 02:23 03/29/2019 04:04 03/29/2019 05:12 03/29/2019 06:08  Glucose-Capillary Latest Ref Range: 70 - 99 mg/dL 116 (H) 166 (H) 154 (H) 139 (H) 147 (H) 145 (H)  Results for Laurie Ortiz, Laurie Ortiz (MRN [id number]) as of 03/29/2019 06:59  Ref. Range 03/29/2019 06:19  CO2 Latest Ref Range: 22 - 32 mmol/L 17 (L)   Results for Laurie Ortiz, Laurie Ortiz (MRN [id number]) as of 03/29/2019 06:59  Ref. Range 03/29/2019 06:19  Anion gap Latest Ref Range: 5 - 15  8  Results for Laurie Ortiz, Laurie Ortiz (MRN [id number]) as of 03/29/2019 06:59  Ref. Range 07/01/2018 04:26 03/28/2019 00:56  Hemoglobin A1C Latest Ref Range: 4.8 - 5.6 % 9.9 (H) 10.2 (H)   Review of Glycemic Control  Diabetes history: DM1 (makes NO insulin; requires basal, correction, and carbohydrate coverage insulin) Outpatient Diabetes medications: Toujeo 22 units QHS, Novolog 2 units per 2 grams of carbs, Novolog 2 units per 50 mg/dl above target of 150 mg/dl Current orders for Inpatient glycemic control: IV insulin  Inpatient Diabetes Program Recommendations:   Transition from IV to SQ insulin: Once MD is ready to transition from IV to SQ insulin, please consider ordering Lantus 22 units daily, Novolog 0-9 units TID with meals and HS, Novolog 6 units TID with meals for meal coverage if patient eats at least 50% of meals.  NURSING: When patient ready to transition to SQ Insulin, please make sure she receives basal insulin at least 1-2 hours prior to d/c of the IV Insulin drip.  Addendum 03/29/19@14 :38-Spoke with patient over the phone regarding DM control and  outpatient DM medications. Patient states that she is prescribed Toujeo 22 units QHS, Novolog 2 units per 2 grams of carbs, and Novolog 2 units for every 50 mg/dl above target glucose of 150 mg/dl. Patient reports that she had COVID at the end of July and beginning of August and she tested positive again in mid November. Patient reports that she has noted her glucose to be much higher with COVID and she reports that she has been trying to use extra insulin (both Toujeo and Novolog) to try to get glucose under control. Patient notes that she has used all of her Toujeo and her Novolog and the insurance will not allow them to be refilled yet because it has not been long enough based on her current prescriptions. Patient is completely out of both Toujeo and Novolog.  Patient reports that she was told by someone at the pharmacy that she needs to ask her provider to give her a prescription for vials of insulin instead of pens as they are coded different and the insurance will likely cover the vials until it is time to refill her insulin pens. Explained that she needs to be sure to let her Endocrinologist know that she has ran out of insulin because she has been having to use more during sickness with COIVID. Explained that her provider will likely need to increase her max units per day with each insulin in order for the insurance to cover enough to last her between refills.   Patient  states that she has an appointment with her Endocrinologist on Thursday. Patient states she plans to talk to Endocrinologist about possibility of going on an insulin pump. Discussed current A1C of 10.2% on 03/28/19 and explained that current A1C indicates an average glucose of 246 mg/dl. Patient reports that over the past few weeks since she has been dx again with COVID, her glucose has been in the 200-400's mg/dl and sometimes higher.  Discussed importance of DM control especially during sickness. Informed patient that it would be requested  that discharging provider give her Rx for Lantus vials (#30080), Novolog vials (#53614), and insulin syringes (#43154) at time of discharge. Encouraged patient to call her Endocrinologist office right away if she is not able to get Lantus and Novolog vials filled as she will absolutely need insulin to prevent reoccurrence of DKA.  Thanks, Orlando Penner, RN, MSN, CDE Diabetes Coordinator Inpatient Diabetes Program [phone] (Team Pager from 8am to 5pm)

## 2019-03-29 NOTE — Progress Notes (Signed)
PROGRESS NOTE    Laurie Ortiz  BZJ:[id number] DOB: 1990-09-01 DOA: 03/27/2019 PCP: Patient, No Pcp Per    Brief Narrative:  28 year old female with history of type 1 diabetes and previous admissions for DKA, admitted to the hospital with 2-day history of vomiting and weakness.  She did not have any cough, fever, chills or shortness of breath.  Blood sugar was noted to be in the 400s and anion gap of 18.  She was found to be in DKA and admitted for further treatments.   Assessment & Plan:   Principal Problem:   DKA (diabetic ketoacidoses) (HCC) Active Problems:   History of 2019 novel coronavirus disease (COVID-19)   Hypokalemia   Obesity, Class III, BMI 40-49.9 (morbid obesity) (HCC)   1. Diabetic ketoacidosis.  She has been managed with intravenous fluids and intravenous insulin.  Overall anion gap has improved, current bicarb is 17 which is close to her baseline.  Patient will be transitioned to subcutaneous insulin.  Continue to monitor blood sugars to ensure they remain stable and follow serum bicarb.  If she becomes severely hyperglycemic again, may need to go back on insulin infusion.. 2. Insulin-dependent diabetes, uncontrolled with hyperglycemia.  Patient reports that her blood sugars have been running high for quite some time.  She reports compliance with her medications.  She has been requiring increasing doses of basal insulin at home.  Monitor blood sugars since she has been transitioned off insulin infusion.  On discharge, she will need to be prescribed vials of Lantus and NovoLog.  See diabetic coordinator note. 3. Hypokalemia.  Replace 4. Recent Covid infection.  Patient was diagnosed with COVID-19 in 11/2018.  She did not have any real respiratory symptoms at that time.  She did complain of loss of smell and taste.  She was retested in 02/2019 prior to an elective surgical procedure.  Again, she tested positive.  On her current admission, repeat Covid testing was found  to be negative.  DVT prophylaxis: Lovenox Code Status: Full code Family Communication: None present Disposition Plan: Discharge home if blood sugars remain stable.   Consultants:     Procedures:     Antimicrobials:       Subjective: No nausea, vomiting, shortness of breath, cough.  Objective: Vitals:   03/29/19 1100 03/29/19 1200 03/29/19 1300 03/29/19 1521  BP: 118/77 117/67 123/78 113/69  Pulse: 85 82 79 91  Resp: 15 16    Temp:    98.9 F (37.2 C)  TempSrc:    Oral  SpO2: 100% 97% 96% 99%  Weight:      Height:        Intake/Output Summary (Last 24 hours) at 03/29/2019 2033 Last data filed at 03/29/2019 1700 Gross per 24 hour  Intake 3741.99 ml  Output --  Net 3741.99 ml   Filed Weights   03/27/19 1548  Weight: 116.6 kg    Examination:  General exam: Alert, awake, oriented x 3 Respiratory system: Clear to auscultation. Respiratory effort normal. Cardiovascular system:RRR. No murmurs, rubs, gallops. Gastrointestinal system: Abdomen is nondistended, soft and nontender. No organomegaly or masses felt. Normal bowel sounds heard. Central nervous system: Alert and oriented. No focal neurological deficits. Extremities: No C/C/E, +pedal pulses Skin: No rashes, lesions or ulcers Psychiatry: Judgement and insight appear normal. Mood & affect appropriate.    Data Reviewed: I have personally reviewed following labs and imaging studies  CBC: Recent Labs  Lab 03/27/19 1602  WBC 6.6  HGB 15.6*  HCT 45.5  MCV 88.5  PLT 024   Basic Metabolic Panel: Recent Labs  Lab 03/28/19 1302 03/28/19 1710 03/28/19 2037 03/29/19 0037 03/29/19 0619  NA 134* 135 135 135 138  K 3.7 3.0* 3.5 3.1* 3.4*  CL 111 112* 112* 111 113*  CO2 15* 15* 12* 16* 17*  GLUCOSE 199* 251* 133* 162* 149*  BUN 9 11 10 10 9   CREATININE 0.66 0.64 0.55 0.56 0.45  CALCIUM 8.2* 8.6* 8.5* 8.3* 8.6*   GFR: Estimated Creatinine Clearance: 134.6 mL/min (by C-G formula based on SCr of  0.45 mg/dL). Liver Function Tests: No results for input(s): AST, ALT, ALKPHOS, BILITOT, PROT, ALBUMIN in the last 168 hours. No results for input(s): LIPASE, AMYLASE in the last 168 hours. No results for input(s): AMMONIA in the last 168 hours. Coagulation Profile: No results for input(s): INR, PROTIME in the last 168 hours. Cardiac Enzymes: No results for input(s): CKTOTAL, CKMB, CKMBINDEX, TROPONINI in the last 168 hours. BNP (last 3 results) No results for input(s): PROBNP in the last 8760 hours. HbA1C: Recent Labs    03/28/19 0056  HGBA1C 10.2*   CBG: Recent Labs  Lab 03/29/19 0945 03/29/19 1054 03/29/19 1215 03/29/19 1249 03/29/19 1710  GLUCAP 172* 203* 186* 223* 256*   Lipid Profile: No results for input(s): CHOL, HDL, LDLCALC, TRIG, CHOLHDL, LDLDIRECT in the last 72 hours. Thyroid Function Tests: No results for input(s): TSH, T4TOTAL, FREET4, T3FREE, THYROIDAB in the last 72 hours. Anemia Panel: No results for input(s): VITAMINB12, FOLATE, FERRITIN, TIBC, IRON, RETICCTPCT in the last 72 hours. Sepsis Labs: No results for input(s): PROCALCITON, LATICACIDVEN in the last 168 hours.  Recent Results (from the past 240 hour(s))  Respiratory Panel by RT PCR (Flu A&B, Covid) - Nasopharyngeal Swab     Status: None   Collection Time: 03/28/19  7:16 PM   Specimen: Nasopharyngeal Swab  Result Value Ref Range Status   SARS Coronavirus 2 by RT PCR NEGATIVE NEGATIVE Final    Comment: (NOTE) SARS-CoV-2 target nucleic acids are NOT DETECTED. The SARS-CoV-2 RNA is generally detectable in upper respiratoy specimens during the acute phase of infection. The lowest concentration of SARS-CoV-2 viral copies this assay can detect is 131 copies/mL. A negative result does not preclude SARS-Cov-2 infection and should not be used as the sole basis for treatment or other patient management decisions. A negative result may occur with  improper specimen collection/handling, submission of  specimen other than nasopharyngeal swab, presence of viral mutation(s) within the areas targeted by this assay, and inadequate number of viral copies (<131 copies/mL). A negative result must be combined with clinical observations, patient history, and epidemiological information. The expected result is Negative. Fact Sheet for Patients:  PinkCheek.be Fact Sheet for Healthcare Providers:  GravelBags.it This test is not yet ap proved or cleared by the [place] FDA and  has been authorized for detection and/or diagnosis of SARS-CoV-2 by FDA under an Emergency Use Authorization (EUA). This EUA will remain  in effect (meaning this test can be used) for the duration of the COVID-19 declaration under Section 564(b)(1) of the Act, 21 U.S.C. section 360bbb-3(b)(1), unless the authorization is terminated or revoked sooner.    Influenza A by PCR NEGATIVE NEGATIVE Final   Influenza B by PCR NEGATIVE NEGATIVE Final    Comment: (NOTE) The Xpert Xpress SARS-CoV-2/FLU/RSV assay is intended as an aid in  the diagnosis of influenza from Nasopharyngeal swab specimens and  should not be used as a sole basis for treatment. Nasal washings  and  aspirates are unacceptable for Xpert Xpress SARS-CoV-2/FLU/RSV  testing. Fact Sheet for Patients: https://www.moore.com/https://www.fda.gov/media/142436/download Fact Sheet for Healthcare Providers: https://www.young.biz/https://www.fda.gov/media/142435/download This test is not yet approved or cleared by the [place] FDA and  has been authorized for detection and/or diagnosis of SARS-CoV-2 by  FDA under an Emergency Use Authorization (EUA). This EUA will remain  in effect (meaning this test can be used) for the duration of the  Covid-19 declaration under Section 564(b)(1) of the Act, 21  U.S.C. section 360bbb-3(b)(1), unless the authorization is  terminated or revoked. Performed at [facility], [address]  [place], [place] [address]          Radiology Studies: DG Chest Portable 1 View  Result Date: 03/27/2019 CLINICAL DATA:  C/O hyperglycemia, tachypnea x 2-3 days EXAM: PORTABLE CHEST 1 VIEW COMPARISON:  Chest radiograph 11/23/2018 FINDINGS: Cardiomediastinal contours are within normal limits. The lungs are clear. No pneumothorax or large pleural effusion. No acute finding in the visualized skeleton. IMPRESSION: No active cardiopulmonary disease. Electronically Signed   By: Emmaline KluverNancy  Ballantyne M.D.   On: 03/27/2019 20:55        Scheduled Meds: . enoxaparin (LOVENOX) injection  40 mg Subcutaneous Q24H  . [START ON 03/30/2019] influenza vac split quadrivalent PF  0.5 mL Intramuscular Tomorrow-1000  . insulin aspart  0-5 Units Subcutaneous QHS  . insulin aspart  0-9 Units Subcutaneous TID WC  . insulin aspart  6 Units Subcutaneous TID WC  . insulin glargine  25 Units Subcutaneous Daily  . valACYclovir  500 mg Oral QHS   Continuous Infusions: . lactated ringers 125 mL/hr at 03/29/19 1307     LOS: 2 days    Time spent: 25mins    Erick BlinksJehanzeb Altie Savard, MD [facility]   If 7PM-7AM, please contact night-coverage [email or web address]  03/29/2019, 8:33 PM

## 2019-03-30 DIAGNOSIS — R110 Nausea: Secondary | ICD-10-CM

## 2019-03-30 DIAGNOSIS — E876 Hypokalemia: Secondary | ICD-10-CM

## 2019-03-30 LAB — BASIC METABOLIC PANEL
Anion gap: 11 (ref 5–15)
BUN: 9 mg/dL (ref 6–20)
CO2: 20 mmol/L — ABNORMAL LOW (ref 22–32)
Calcium: 8.3 mg/dL — ABNORMAL LOW (ref 8.9–10.3)
Chloride: 106 mmol/L (ref 98–111)
Creatinine, Ser: 0.55 mg/dL (ref 0.44–1.00)
GFR calc Af Amer: >60 mL/min (ref >60)
GFR calc non Af Amer: >60 mL/min (ref >60)
Glucose, Bld: 300 mg/dL — ABNORMAL HIGH (ref 70–99)
Potassium: 3.3 mmol/L — ABNORMAL LOW (ref 3.5–5.1)
Sodium: 137 mmol/L (ref 135–145)

## 2019-03-30 LAB — GLUCOSE, CAPILLARY
Glucose-Capillary: 327 mg/dL — ABNORMAL HIGH (ref 70–99)
Glucose-Capillary: 246 mg/dL — ABNORMAL HIGH (ref 70–99)

## 2019-03-30 MED ORDER — INSULIN GLARGINE 100 UNIT/ML ~~LOC~~ SOLN: SUBCUTANEOUS | 3 refills | Status: DC

## 2019-03-30 MED ORDER — "INSULIN SYRINGE 29G X 1/2"" 0.3 ML MISC": 90.0000 | Freq: Three times a day (TID) | 0 refills | Status: AC

## 2019-03-30 MED ORDER — INSULIN ASPART 100 UNIT/ML ~~LOC~~ SOLN: SUBCUTANEOUS | 3 refills | Status: AC

## 2019-03-30 MED ORDER — INSULIN GLARGINE 100 UNIT/ML ~~LOC~~ SOLN: 32.0000 [IU] | Freq: Every day | SUBCUTANEOUS | Status: DC

## 2019-03-30 MED ORDER — INSULIN ASPART 100 UNIT/ML ~~LOC~~ SOLN: 10.0000 [IU] | Freq: Three times a day (TID) | SUBCUTANEOUS | Status: DC

## 2019-03-30 NOTE — Progress Notes (Signed)
Pt ready for discharge home today per MD. Patient assessment unchanged from this morning. Reviewed discharge instructions and prescriptions with pt; all questions answered and pt verbalized understanding. PIV removed, VSS. Pt assisted to car via NT.   Laurie Ortiz  

## 2019-03-30 NOTE — Progress Notes (Signed)
Inpatient Diabetes Program Recommendations  AACE/ADA: New Consensus Statement on Inpatient Glycemic Control   Target Ranges:  Prepandial:   less than 140 mg/dL      Peak postprandial:   less than 180 mg/dL (1-2 hours)      Critically ill patients:  140 - 180 mg/dL   Results for JUDEE, HENNICK (MRN [id number]) as of 03/30/2019 07:38  Ref. Range 03/30/2019 03:06  Glucose Latest Ref Range: 70 - 99 mg/dL 300 (H)   Results for LAURALIE, BLACKSHER (MRN [id number]) as of 03/30/2019 07:38  Ref. Range 03/29/2019 06:08 03/29/2019 07:45 03/29/2019 09:45 03/29/2019 10:54 03/29/2019 12:15 03/29/2019 12:49 03/29/2019 17:10 03/29/2019 21:11  Glucose-Capillary Latest Ref Range: 70 - 99 mg/dL 145 (H) 136 (H) 172 (H) 203 (H) 186 (H) 223 (H) 256 (H) 350 (H)    Review of Glycemic Control Diabetes history: DM1 (makes NO insulin; requires basal, correction, and carbohydrate coverage insulin) Outpatient Diabetes medications: Toujeo 22 units QHS, Novolog 2 units per 2 grams of carbs, Novolog 2 units per 50 mg/dl above target of 150 mg/dl Current orders for Inpatient glycemic control: Lantus 25 units daily, Novolog 6 units TID with meals, Novolog 0-9 units TID with meals, Novolog 0-5 units QHS  Inpatient Diabetes Program Recommendations:   Insulin-Basal: Please consider increasing Lantus to 32 units daily.  Insulin-Meal Coverage: Please consider increasing meal coverage to Novolog 10 units TID with meals for meal coverage if patient eats at least 50% of meals.  Thanks, Barnie Alderman, RN, MSN, CDE Diabetes Coordinator Inpatient Diabetes Program [phone] (Team Pager from 8am to 5pm)

## 2019-03-30 NOTE — Discharge Instructions (Signed)
Diabetic Ketoacidosis °Diabetic ketoacidosis is a serious complication of diabetes. This condition develops when there is not enough insulin in the body. Insulin is an hormone that regulates blood sugar levels in the body. Normally, insulin allows glucose to enter the cells in the body. The cells break down glucose for energy. Without enough insulin, the body cannot break down glucose, so it breaks down fats instead. This leads to high blood glucose levels in the body and the production of acids that are called ketones. Ketones are poisonous at high levels. °If diabetic ketoacidosis is not treated, it can cause severe dehydration and can lead to a coma or death. °What are the causes? °This condition develops when a lack of insulin causes the body to break down fats instead of glucose. This may be triggered by: °· Stress on the body. This stress is brought on by an illness. °· Infection. °· Medicines that raise blood glucose levels. °· Not taking diabetes medicine. °· New onset of type 1 diabetes mellitus. °What are the signs or symptoms? °Symptoms of this condition include: °· Fatigue. °· Weight loss. °· Excessive thirst. °· Light-headedness. °· Fruity or sweet-smelling breath. °· Excessive urination. °· Vision changes. °· Confusion or irritability. °· Nausea. °· Vomiting. °· Rapid breathing. °· Abdominal pain. °· Feeling flushed. °How is this diagnosed? °This condition is diagnosed based on your medical history, a physical exam, and blood tests. You may also have a urine test to check for ketones. °How is this treated? °This condition may be treated with: °· Fluid replacement. This may be done to correct dehydration. °· Insulin injections. These may be given through the skin or through an IV tube. °· Electrolyte replacement. Electrolytes are minerals in your blood. Electrolytes such as potassium and sodium may be given in pill form or through an IV tube. °· Antibiotic medicines. These may be prescribed if your  condition was caused by an infection. °Diabetic ketoacidosis is a serious medical condition. You may need emergency treatment in the hospital to monitor your condition. °Follow these instructions at home: °Eating and drinking °· Drink enough fluids to keep your urine clear or pale yellow. °· If you are not able to eat, drink clear fluids in small amounts as you are able. Clear fluids include water, ice chips, fruit juice with water added (diluted), and low-calorie sports drinks. You may also have sugar-free jello or popsicles. °· If you are able to eat, follow your usual diet and drink sugar-free liquids, such as water. °Medicines °· Take over-the-counter and prescription medicines only as told by your health care provider. °· Continue to take insulin and other diabetes medicines as told by your health care provider. °· If you were prescribed an antibiotic, take it as told by your health care provider. Do not stop taking the antibiotic even if you start to feel better. °General instructions ° °· Check your urine for ketones when you are ill and as told by your health care provider. °? If your blood glucose is 240 mg/dL (13.3 mmol/L) or higher, check your urine ketones every 4-6 hours. °· Check your blood glucose every day, as often as told by your health care provider. °? If your blood glucose is high, drink plenty of fluids. This helps to flush out ketones. °? If your blood glucose is above your target for 2 tests in a row, contact your health care provider. °· Carry a medical alert card or wear medical alert jewelry that says that you have diabetes. °· Rest   and exercise only as told by your health care provider. Do not exercise when your blood glucose is high and you have ketones in your urine. °· If you get sick, call your health care provider and begin treatment quickly. Your body often needs extra insulin to fight an illness. Check your blood glucose every 4-6 hours when you are sick. °· Keep all follow-up  visits as told by your health care provider. This is important. °Contact a health care provider if: °· Your blood glucose level is higher than 240 mg/dL (13.3 mmol/L) for 2 days in a row. °· You have moderate or large ketones in your urine. °· You have a fever. °· You cannot eat or drink without vomiting. °· You have been vomiting for more than 2 hours. °· You continue to have symptoms of diabetic ketoacidosis. °· You develop new symptoms. °Get help right away if: °· Your blood glucose monitor reads “high” even when you are taking insulin. °· You faint. °· You have chest pain. °· You have trouble breathing. °· You have sudden trouble speaking or swallowing. °· You have vomiting or diarrhea that gets worse after 3 hours. °· You are unable to stay awake. °· You have trouble thinking. °· You are severely dehydrated. Symptoms of severe dehydration include: °? Extreme thirst. °? Dry mouth. °? Rapid breathing. °These symptoms may represent a serious problem that is an emergency. Do not wait to see if the symptoms will go away. Get medical help right away. Call your local emergency services (911 in the [place] Do not drive yourself to the hospital. °Summary °· Diabetic ketoacidosis is a serious complication of diabetes. This condition develops when there is not enough insulin in the body. °· This condition is diagnosed based on your medical history, a physical exam, and blood tests. You may also have a urine test to check for ketones. °· Diabetic ketoacidosis is a serious medical condition. You may need emergency treatment in the hospital to monitor your condition. °· Contact your health care provider if your blood glucose is higher than 240 mg/dl for 2 days in a row or if you have moderate or large ketones in your urine. °This information is not intended to replace advice given to you by your health care provider. Make sure you discuss any questions you have with your health care provider. °Document Released: 03/28/2000  Document Revised: 05/16/2016 Document Reviewed: 05/05/2016 °Elsevier Patient Education © 2020 [employer] ° °

## 2019-04-01 ENCOUNTER — Other Ambulatory Visit: Payer: Self-pay

## 2019-04-01 ENCOUNTER — Ambulatory Visit (INDEPENDENT_AMBULATORY_CARE_PROVIDER_SITE_OTHER): Payer: BC Managed Care – PPO | Admitting: Obstetrics and Gynecology

## 2019-04-01 ENCOUNTER — Encounter: Payer: Self-pay | Admitting: Obstetrics and Gynecology

## 2019-04-01 VITALS — BP 122/74 | Ht 67.0 in | Wt 258.0 lb

## 2019-04-01 DIAGNOSIS — R300 Dysuria: Secondary | ICD-10-CM

## 2019-04-01 DIAGNOSIS — N3001 Acute cystitis with hematuria: Secondary | ICD-10-CM

## 2019-04-01 LAB — POCT URINALYSIS DIPSTICK
Bilirubin, UA: NEGATIVE
Glucose, UA: NEGATIVE
Ketones, UA: POSITIVE
Nitrite, UA: NEGATIVE
Protein, UA: NEGATIVE
Spec Grav, UA: 1.02 (ref 1.010–1.025)
Urobilinogen, UA: NEGATIVE E.U./dL — AB
pH, UA: 7 (ref 5.0–8.0)

## 2019-04-01 MED ORDER — NITROFURANTOIN MONOHYD MACRO 100 MG PO CAPS: 100.0000 mg | ORAL_CAPSULE | Freq: Two times a day (BID) | ORAL | 0 refills | Status: AC

## 2019-04-01 NOTE — Discharge Summary (Signed)
[facility] [place] NAME: Laurie Ortiz    MR#:  [id number]  DATE OF BIRTH:  06-28-1990  DATE OF ADMISSION:  03/27/2019   ADMITTING PHYSICIAN: Kathie Dike, MD  DATE OF DISCHARGE: 03/30/2019  1:00 PM  PRIMARY CARE PHYSICIAN: Cyndie Chime, MD   ADMISSION DIAGNOSIS:  DKA (diabetic ketoacidoses) ([place]) [E11.10] Nausea [R11.0] Diabetic ketoacidosis without coma associated with type 2 diabetes mellitus ([place]) [E11.10] DISCHARGE DIAGNOSIS:  Principal Problem:   DKA (diabetic ketoacidoses) ([place]) Active Problems:   History of 2019 novel coronavirus disease (COVID-19)   Hypokalemia   Obesity, Class III, BMI 40-49.9 (morbid obesity) ([place])   Nausea  SECONDARY DIAGNOSIS:   Past Medical History:  Diagnosis Date  . Complication of anesthesia    slow to wake after wisdom teeth extraction  . COVID-19 11/12/2018   tested (+) 11/16/18,  (-) 12/01/18  . Diabetes mellitus, type 2 ([place])   . Family history of adverse reaction to anesthesia    Mother - slow to wake  . History of chlamydia 08/2015  . History of Papanicolaou smear of cervix 04/22/13; 08/30/15   NEG CT/GC NEG; NEG CT-POS/GC/TR NEG  . History of vaginitis   . Hypercholesteremia   . Obesity (BMI 30.0-34.9)    HOSPITAL COURSE:  28 y.o. female with a history of type 1 diabetes past admissions for DKA as well as history of COVID-19 infection in August 2020 admitted for 2-day history of vomiting and weakness  1. Diabetic ketoacidosis - resolved with insulin drip and agressive hydration 2. Insulin-dependent diabetes, uncontrolled with hyperglycemia.  Patient reports that her blood sugars have been running high for quite some time.  She reports compliance with her medications.  She has been requiring increasing doses of basal insulin at home. Has follow up with her endocrinologist tomorrow morning and I encouraged her to make sure she doesn't miss the appointment. 3. Hypokalemia.  Replaced 4. Recent  Covid infection - asymptomatic  Patient was adamant in leaving the Hospital even though her sugars running high as she doesn't want to miss her appt with endocrinologist in early morning tomorrow. She agrees to be compliant with her meds and diet. DISCHARGE CONDITIONS:  stable CONSULTS OBTAINED:   DRUG ALLERGIES:   Allergies  Allergen Reactions  . Penicillins Hives and Rash    Did it involve swelling of the face/tongue/throat, SOB, or low BP? No Did it involve sudden or severe rash/hives, skin peeling, or any reaction on the inside of your mouth or nose? Yes Did you need to seek medical attention at a hospital or doctor's office? No When did it last happen?1998 If all above answers are "NO", may proceed with cephalosporin use.  Doesn't take any medications ending in "cillin"  . Amoxicillin Hives and Rash    (rash 09/1996)   DISCHARGE MEDICATIONS:   Allergies as of 03/30/2019      Reactions   Penicillins Hives, Rash   Did it involve swelling of the face/tongue/throat, SOB, or low BP? No Did it involve sudden or severe rash/hives, skin peeling, or any reaction on the inside of your mouth or nose? Yes Did you need to seek medical attention at a hospital or doctor's office? No When did it last happen?1998 If all above answers are "NO", may proceed with cephalosporin use. Doesn't take any medications ending in "cillin"   Amoxicillin Hives, Rash   (rash 09/1996)      Medication List    STOP taking these medications  HYDROcodone-Acetaminophen 5-300 MG Tabs   insulin lispro 100 UNIT/ML KwikPen Commonly known as: HumaLOG KwikPen   NovoLOG FlexPen 100 UNIT/ML FlexPen Generic drug: insulin aspart Replaced by: insulin aspart 100 UNIT/ML injection   sulfamethoxazole-trimethoprim 800-160 MG tablet Commonly known as: BACTRIM DS   Toujeo SoloStar 300 UNIT/ML Sopn Generic drug: Insulin Glargine (1 Unit Dial) Replaced by: insulin glargine 100 UNIT/ML injection     TAKE these  medications   atorvastatin 40 MG tablet Commonly known as: LIPITOR Take 40 mg by mouth daily.   insulin aspart 100 UNIT/ML injection Commonly known as: NovoLOG 10 units subcu with each meals Replaces: NovoLOG FlexPen 100 UNIT/ML FlexPen   insulin glargine 100 UNIT/ML injection Commonly known as: LANTUS 32 units subcu daily Replaces: Toujeo SoloStar 300 UNIT/ML Sopn   INSULIN SYRINGE .3CC/29GX1/2" 29G X 1/2" 0.3 ML Misc 90 Syringes by Does not apply route 3 (three) times daily.   Levonorgestrel-Ethinyl Estradiol 0.15-0.03 &0.01 MG tablet Commonly known as: AMETHIA Take 1 tablet by mouth daily.   valACYclovir 500 MG tablet Commonly known as: VALTREX Take 1 tablet (500 mg total) by mouth daily. What changed: when to take this        DISCHARGE INSTRUCTIONS:   DIET:  Diabetic diet DISCHARGE CONDITION:  Stable ACTIVITY:  Activity as tolerated OXYGEN:  Home Oxygen: No.  Oxygen Delivery: room air DISCHARGE LOCATION:  home   If you experience worsening of your admission symptoms, develop shortness of breath, life threatening emergency, suicidal or homicidal thoughts you must seek medical attention immediately by calling 911 or calling your MD immediately  if symptoms less severe.  You Must read complete instructions/literature along with all the possible adverse reactions/side effects for all the Medicines you take and that have been prescribed to you. Take any new Medicines after you have completely understood and accpet all the possible adverse reactions/side effects.   Please note  You were cared for by a hospitalist during your hospital stay. If you have any questions about your discharge medications or the care you received while you were in the hospital after you are discharged, you can call the unit and asked to speak with the hospitalist on call if the hospitalist that took care of you is not available. Once you are discharged, your primary care physician will handle  any further medical issues. Please note that NO REFILLS for any discharge medications will be authorized once you are discharged, as it is imperative that you return to your primary care physician (or establish a relationship with a primary care physician if you do not have one) for your aftercare needs so that they can reassess your need for medications and monitor your lab values.    On the day of Discharge:  VITAL SIGNS:  Blood pressure 125/80, pulse 95, temperature 97.8 F (36.6 C), temperature source Oral, resp. rate 18, height 5\' 6"  (1.676 m), weight 116.6 kg, last menstrual period 02/27/2019, SpO2 98 %. PHYSICAL EXAMINATION:  GENERAL:  28 y.o.-year-old patient lying in the bed with no acute distress.  EYES: Pupils equal, round, reactive to light and accommodation. No scleral icterus. Extraocular muscles intact.  HEENT: Head atraumatic, normocephalic. Oropharynx and nasopharynx clear.  NECK:  Supple, no jugular venous distention. No thyroid enlargement, no tenderness.  LUNGS: Normal breath sounds bilaterally, no wheezing, rales,rhonchi or crepitation. No use of accessory muscles of respiration.  CARDIOVASCULAR: S1, S2 normal. No murmurs, rubs, or gallops.  ABDOMEN: Soft, non-tender, non-distended. Bowel sounds present. No organomegaly or mass.  EXTREMITIES: No pedal edema, cyanosis, or clubbing.  NEUROLOGIC: Cranial nerves II through XII are intact. Muscle strength 5/5 in all extremities. Sensation intact. Gait not checked.  PSYCHIATRIC: The patient is alert and oriented x 3.  SKIN: No obvious rash, lesion, or ulcer.  DATA REVIEW:   CBC Recent Labs  Lab 03/27/19 1602  WBC 6.6  HGB 15.6*  HCT 45.5  PLT 317    Chemistries  Recent Labs  Lab 03/30/19 0306  NA 137  K 3.3*  CL 106  CO2 20*  GLUCOSE 300*  BUN 9  CREATININE 0.55  CALCIUM 8.3*     Follow-up Information    Simonne MartinetElston, Scott Cody, MD. Schedule an appointment as soon as possible for a visit in 1 week.    Specialty: Internal Medicine Why: NOTE:  [facility]; Closes @ Noon on Dec 24th; Otherwise-8 am to 7 pm M-F Contact information: [address] [place] [address] [phone]        Betsey AmenLevy, Matthew E, MD. Go on 03/31/2019.   Specialty: Endocrinology Why: At 8:30 AM as scheduled Contact information: [address] [place] [address] [phone]           Management plans discussed with the patient, family and they are in agreement.  CODE STATUS: Prior   TOTAL TIME TAKING CARE OF THIS PATIENT: 45 minutes.    Delfino LovettVipul Charly Holcomb M.D on 04/01/2019 at 11:05 AM  Between 7am to 6pm - Pager - [phone]  After 6pm go to [email or web address] - password [email or web address]  [facility]   CC: Primary care physician; Simonne MartinetElston, Scott Cody, MD   Note: This dictation was prepared with Dragon dictation along with smaller phrase technology. Any transcriptional errors that result from this process are unintentional.

## 2019-04-01 NOTE — Progress Notes (Signed)
Obstetrics & Gynecology Office Visit   Chief Complaint  Patient presents with   Dysuria   History of Present Illness: Urinary Tract Infection: Patient complains of burning with urination and frequency She has had symptoms for 1 week. Patient also complains of mild bilateral lower back pain. Patient denies vaginal symptoms of abnormal discharge, itching, burning, irritation, she denies fevers, chills, flank pain, hematuria. Patient does not have a history of recurrent UTI.  Patient does not have a history of pyelonephritis.  Of note, she was recently hospitalized with DKA. She states that she did not have a urinary catheter.   Past Medical History:  Diagnosis Date   Complication of anesthesia    slow to wake after wisdom teeth extraction   COVID-19 11/12/2018   tested (+) 11/16/18,  (-) 12/01/18   Diabetes mellitus, type 2 ([facility])    Family history of adverse reaction to anesthesia    Mother - slow to wake   History of chlamydia 08/2015   History of Papanicolaou smear of cervix 04/22/13; 08/30/15   NEG CT/GC NEG; NEG CT-POS/GC/TR NEG   History of vaginitis    Hypercholesteremia    Obesity (BMI 30.0-34.9)     Past Surgical History:  Procedure Laterality Date   NASAL SEPTOPLASTY W/ TURBINOPLASTY Bilateral 03/04/2019   Procedure: NASAL SEPTOPLASTY WITH SUBMUCOUS RESECTION OF NASAL TURBINATES;  Surgeon: Linus SalmonsMcQueen, Chapman, MD;  [facility];  Service: ENT;  Laterality: Bilateral;  Diabetic - insulin   VAGINA SURGERY     age 28 or [age]   WISDOM TOOTH EXTRACTION      Gynecologic History: No LMP recorded.  Obstetric History: G1P1001  Family History  Problem Relation Age of Onset   Hypertension Mother    Heart disease Maternal Grandmother    Breast cancer Paternal Grandmother        breast cancer in late 40s-died from breast cancer   Prostate cancer Paternal Grandfather    Dementia Paternal Grandfather    Parkinson's disease Paternal Grandfather      Social History   Socioeconomic History   Marital status: Single    Spouse name: Not on file   Number of children: 1   Years of education: 12   Highest education level: Not on file  Occupational History   Occupation: Event organiserManager    Employer: [employer]  Tobacco Use   Smoking status: Never Smoker   Smokeless tobacco: Never Used  Substance and Sexual Activity   Alcohol use: Not Currently    Comment: OCC   Drug use: No   Sexual activity: Yes    Partners: Male    Birth control/protection: I.U.D.    Comment: Mirena  Other Topics Concern   Not on file  Social History Narrative   Not on file   Social Determinants of Health   Financial Resource Strain:    Difficulty of Paying Living Expenses: Not on file  Food Insecurity:    Worried About Programme researcher, broadcasting/film/videounning Out of Food in the Last Year: Not on file   [employer] Out of Food in the Last Year: Not on file  Transportation Needs:    Lack of Transportation (Medical): Not on file   Lack of Transportation (Non-Medical): Not on file  Physical Activity:    Days of Exercise per Week: Not on file   Minutes of Exercise per Session: Not on file  Stress:    Feeling of Stress : Not on file  Social Connections:    Frequency of Communication with Friends and Family:  Not on file   Frequency of Social Gatherings with Friends and Family: Not on file   Attends Religious Services: Not on file   Active Member of Clubs or Organizations: Not on file   Attends Archivist Meetings: Not on file   Marital Status: Not on file  Intimate Partner Violence:    Fear of Current or Ex-Partner: Not on file   Emotionally Abused: Not on file   Physically Abused: Not on file   Sexually Abused: Not on file    Allergies  Allergen Reactions   Penicillins Hives and Rash    Did it involve swelling of the face/tongue/throat, SOB, or low BP? No Did it involve sudden or severe rash/hives, skin peeling, or any reaction on the inside of your  mouth or nose? Yes Did you need to seek medical attention at a hospital or doctor's office? No When did it last happen?1998 If all above answers are NO, may proceed with cephalosporin use.  Doesn't take any medications ending in "cillin"   Amoxicillin Hives and Rash    (rash 09/1996)    Prior to Admission medications   Medication Sig Start Date End Date Taking? Authorizing Provider  atorvastatin (LIPITOR) 40 MG tablet Take 40 mg by mouth daily.   Yes [provider]  insulin aspart (NOVOLOG) 100 UNIT/ML injection 10 units subcu with each meals 03/30/19 03/29/20 Yes Max Sane, MD  insulin glargine (LANTUS) 100 UNIT/ML injection 32 units subcu daily 03/30/19 03/29/20 Yes Max Sane, MD  Insulin Syringe-Needle U-100 (INSULIN SYRINGE .3CC/29GX1/2") 29G X 1/2" 0.3 ML MISC 90 Syringes by Does not apply route 3 (three) times daily. 03/30/19 04/29/19 Yes Max Sane, MD  Levonorgestrel-Ethinyl Estradiol (AMETHIA) 0.15-0.03 &0.01 MG tablet Take 1 tablet by mouth daily. 11/23/73  Yes Copland, Deirdre Evener, PA-C  valACYclovir (VALTREX) 500 MG tablet Take 1 tablet (500 mg total) by mouth daily. Patient taking differently: Take 500 mg by mouth at bedtime.  04/20/98  Yes Copland, Deirdre Evener, PA-C    Review of Systems  Constitutional: Negative.   HENT: Negative.   Eyes: Negative.   Respiratory: Negative.   Cardiovascular: Negative.   Gastrointestinal: Negative.   Genitourinary: Positive for dysuria and frequency. Negative for flank pain, hematuria and urgency.  Musculoskeletal: Positive for back pain (mild bilateral lower back pain). Negative for falls, joint pain, myalgias and neck pain.  Skin: Negative.   Neurological: Negative.   Psychiatric/Behavioral: Negative.      Physical Exam BP 122/74    Ht 5\' 7"  (1.702 m)    Wt 258 lb (117 kg)    BMI 40.41 kg/m  No LMP recorded. Physical Exam Constitutional:      General: She is not in acute distress.    Appearance: Normal appearance.   HENT:     Head: Normocephalic and atraumatic.  Eyes:     General: No scleral icterus.    Conjunctiva/sclera: Conjunctivae normal.  Abdominal:     Palpations: Abdomen is soft.     Tenderness: There is no abdominal tenderness. There is no right CVA tenderness or left CVA tenderness.  Neurological:     General: No focal deficit present.     Mental Status: She is alert and oriented to person, place, and time.     Cranial Nerves: No cranial nerve deficit.  Psychiatric:        Mood and Affect: Mood normal.        Behavior: Behavior normal.        Judgment:  Judgment normal.    Urinalysis  Ref Range & Units 11:30  Color, UA    Clarity, UA    Glucose, UA Negative Negative   Bilirubin, UA  neg   Ketones, UA  pos   Spec Grav, UA 1.010 - 1.025 1.020   Blood, UA  large   pH, UA 5.0 - 8.0 7.0   Protein, UA Negative Negative   Urobilinogen, UA 0.2 or 1.0 E.U./dL negativeAbnormal    Nitrite, UA  neg   Leukocytes, UA Negative Moderate (2+)Abnormal    Appearance    Odor       Assessment: 28 y.o. G25P1001 female here for  1. Acute cystitis with hematuria   2. Dysuria      Plan: Problem List Items Addressed This Visit    None    Visit Diagnoses    Acute cystitis with hematuria    -  Primary   Relevant Medications   nitrofurantoin, macrocrystal-monohydrate, (MACROBID) 100 MG capsule   Other Relevant Orders   Urine C&S (LC)   Dysuria       Relevant Medications   nitrofurantoin, macrocrystal-monohydrate, (MACROBID) 100 MG capsule   Other Relevant Orders   POCT Urinalysis Dipstick (Completed)   Urine C&S (LC)     Will treat presumptively for UTI. It is unclear whether she had a catheter recently as she was in DKA and she might not have been lucid when the catheter was in place. She notes no clear reason why she did have DKA. However, given this risk and her suggestive symptoms and UA, will treat presumptively. Will send UA for confirmation.    Thomasene Mohair, MD 04/01/2019  11:50 AM

## 2019-04-03 LAB — URINE CULTURE

## 2019-08-10 ENCOUNTER — Other Ambulatory Visit: Payer: Self-pay | Admitting: Obstetrics and Gynecology

## 2019-08-21 ENCOUNTER — Other Ambulatory Visit: Payer: Self-pay | Admitting: Obstetrics and Gynecology

## 2019-08-21 DIAGNOSIS — Z30011 Encounter for initial prescription of contraceptive pills: Secondary | ICD-10-CM

## 2019-09-29 NOTE — Progress Notes (Deleted)
Cyndie Chime, MD   No chief complaint on file.   HPI:      [name] is a 29 y.o. G1P1001 whose LMP was No LMP recorded., presents today for *** No previous breast imaging   Past Medical History:  Diagnosis Date  . Complication of anesthesia    slow to wake after wisdom teeth extraction  . COVID-19 11/12/2018   tested (+) 11/16/18,  (-) 12/01/18  . Diabetes mellitus, type 2 ([place])   . Family history of adverse reaction to anesthesia    Mother - slow to wake  . History of chlamydia 08/2015  . History of Papanicolaou smear of cervix 04/22/13; 08/30/15   NEG CT/GC NEG; NEG CT-POS/GC/TR NEG  . History of vaginitis   . Hypercholesteremia   . Obesity (BMI 30.0-34.9)     Past Surgical History:  Procedure Laterality Date  . NASAL SEPTOPLASTY W/ TURBINOPLASTY Bilateral 03/04/2019   Procedure: NASAL SEPTOPLASTY WITH SUBMUCOUS RESECTION OF NASAL TURBINATES;  Surgeon: Beverly Gust, MD;  Location: [facility];  Service: ENT;  Laterality: Bilateral;  Diabetic - insulin  . VAGINA SURGERY     age 68 or 66  . WISDOM TOOTH EXTRACTION      Family History  Problem Relation Age of Onset  . Hypertension Mother   . Heart disease Maternal Grandmother   . Breast cancer Paternal Grandmother        breast cancer in late 40s-died from breast cancer  . Prostate cancer Paternal Grandfather   . Dementia Paternal Grandfather   . Parkinson's disease Paternal Grandfather     Social History   Socioeconomic History  . Marital status: Single    Spouse name: Not on file  . Number of children: 1  . Years of education: 38  . Highest education level: Not on file  Occupational History  . Occupation: Best boy: [employer]  Tobacco Use  . Smoking status: Never Smoker  . Smokeless tobacco: Never Used  Vaping Use  . Vaping Use: Never used  Substance and Sexual Activity  . Alcohol use: Not Currently    Comment: OCC  . Drug use: No  . Sexual activity: Yes     Partners: Male    Birth control/protection: I.U.D.    Comment: Mirena  Other Topics Concern  . Not on file  Social History Narrative  . Not on file   Social Determinants of Health   Financial Resource Strain:   . Difficulty of Paying Living Expenses:   Food Insecurity:   . Worried About Charity fundraiser in the Last Year:   . Arboriculturist in the Last Year:   Transportation Needs:   . Film/video editor (Medical):   Marland Kitchen Lack of Transportation (Non-Medical):   Physical Activity:   . Days of Exercise per Week:   . Minutes of Exercise per Session:   Stress:   . Feeling of Stress :   Social Connections:   . Frequency of Communication with Friends and Family:   . Frequency of Social Gatherings with Friends and Family:   . Attends Religious Services:   . Active Member of Clubs or Organizations:   . Attends Archivist Meetings:   Marland Kitchen Marital Status:   Intimate Partner Violence:   . Fear of Current or Ex-Partner:   . Emotionally Abused:   Marland Kitchen Physically Abused:   . Sexually Abused:     Outpatient Medications Prior to Visit  Medication Sig Dispense Refill  . atorvastatin (LIPITOR) 40 MG tablet Take 40 mg by mouth daily.    . insulin aspart (NOVOLOG) 100 UNIT/ML injection 10 units subcu with each meals 10 mL 3  . insulin glargine (LANTUS) 100 UNIT/ML injection 32 units subcu daily 10 mL 3  . Levonorgestrel-Ethinyl Estradiol (AMETHIA) 0.15-0.03 &0.01 MG tablet Take 1 tablet by mouth daily. 1 Package 2  . valACYclovir (VALTREX) 500 MG tablet Take 1 tablet (500 mg total) by mouth daily. (Patient taking differently: Take 500 mg by mouth at bedtime. ) 90 tablet 2   No facility-administered medications prior to visit.      ROS:  Review of Systems BREAST: No symptoms   OBJECTIVE:   Vitals:  There were no vitals taken for this visit.  Physical Exam  Results: No results found for this or any previous visit (from the past 24 hour(s)).   Assessment/Plan: No  diagnosis found.    No orders of the defined types were placed in this encounter.     No follow-ups on file.  Terriyah Westra B. Donnajean Chesnut, PA-C 09/29/2019 2:45 PM

## 2019-09-30 ENCOUNTER — Ambulatory Visit: Payer: BC Managed Care – PPO | Admitting: Obstetrics and Gynecology

## 2019-09-30 NOTE — Progress Notes (Signed)
Simonne Martinet, MD   Chief Complaint  Patient presents with  . Breast exam    lump on LB, no tenderness/pain/redness/swelling x 1 week  . Vaginal Discharge    itchiness, no odor or irritation x 2 days    HPI:      [name] is a 29 y.o. G1P1001 whose LMP was Patient's last menstrual period was 09/09/2019 (approximate)., presents today for LT breast thickness/firmness upper quadrant for the past week. Noticed it when getting out of shower. No discrete masses, tenderness, erythema, trauma, nipple d/c, drainage. No hx of breast masses in past, no prior imaging. FH breast cancer in her PGM. Drinks little caffeine. Menses due next wk.   Also with increased d/c with mild irritation, no odor for the past 2 days. No meds to treat. No prior abx use. Hx of DM, uncontrolled 2/21. She is sex active, no new partners.   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 Takes daily as preventive.  Trying to conceive, not taking PNVs.  Past Medical History:  Diagnosis Date  . Complication of anesthesia    slow to wake after wisdom teeth extraction  . COVID-19 11/12/2018   tested (+) 11/16/18,  (-) 12/01/18  . Diabetes mellitus, type 2 (HCC)   . Family history of adverse reaction to anesthesia    Mother - slow to wake  . Genital herpes 10/2018   type 2 on culture  . History of chlamydia 08/2015  . History of Papanicolaou smear of cervix 04/22/13; 08/30/15   NEG CT/GC NEG; NEG CT-POS/GC/TR NEG  . History of vaginitis   . Hypercholesteremia   . Obesity (BMI 30.0-34.9)     Past Surgical History:  Procedure Laterality Date  . NASAL SEPTOPLASTY W/ TURBINOPLASTY Bilateral 03/04/2019   Procedure: NASAL SEPTOPLASTY WITH SUBMUCOUS RESECTION OF NASAL TURBINATES;  Surgeon: Linus Salmons, MD;  Location: [facility];  Service: ENT;  Laterality: Bilateral;  Diabetic - insulin  . VAGINA SURGERY     age 29 or 38  . WISDOM TOOTH EXTRACTION      Family History  Problem Relation Age of  Onset  . Hypertension Mother   . Heart disease Maternal Grandmother   . Breast cancer Paternal Grandmother        breast cancer in late 40s-died from breast cancer  . Prostate cancer Paternal Grandfather   . Dementia Paternal Grandfather   . Parkinson's disease Paternal Grandfather     Social History   Socioeconomic History  . Marital status: Single    Spouse name: Not on file  . Number of children: 1  . Years of education: 76  . Highest education level: Not on file  Occupational History  . Occupation: Event organiser: [employer]  Tobacco Use  . Smoking status: Never Smoker  . Smokeless tobacco: Never Used  Vaping Use  . Vaping Use: Never used  Substance and Sexual Activity  . Alcohol use: Not Currently    Comment: OCC  . Drug use: No  . Sexual activity: Yes    Partners: Male    Birth control/protection: Pill  Other Topics Concern  . Not on file  Social History Narrative  . Not on file   Social Determinants of Health   Financial Resource Strain:   . Difficulty of Paying Living Expenses:   Food Insecurity:   . Worried About Programme researcher, broadcasting/film/video in the Last Year:   . Barista in  the Last Year:   Transportation Needs:   . Freight forwarder (Medical):   Marland Kitchen Lack of Transportation (Non-Medical):   Physical Activity:   . Days of Exercise per Week:   . Minutes of Exercise per Session:   Stress:   . Feeling of Stress :   Social Connections:   . Frequency of Communication with Friends and Family:   . Frequency of Social Gatherings with Friends and Family:   . Attends Religious Services:   . Active Member of Clubs or Organizations:   . Attends Banker Meetings:   Marland Kitchen Marital Status:   Intimate Partner Violence:   . Fear of Current or Ex-Partner:   . Emotionally Abused:   Marland Kitchen Physically Abused:   . Sexually Abused:     Outpatient Medications Prior to Visit  Medication Sig Dispense Refill  . atorvastatin (LIPITOR) 40 MG tablet Take 40 mg by  mouth daily.    . Continuous Blood Gluc Sensor (DEXCOM G6 SENSOR) MISC DISPENSE AND USE AS DIRECTED    . Continuous Blood Gluc Transmit (DEXCOM G6 TRANSMITTER) MISC DISPENSE AND USE AS DIRECTED    . glucose blood (ACCU-CHEK AVIVA PLUS) test strip Use to check blood sugars up to 6 times daily    . insulin aspart (NOVOLOG) 100 UNIT/ML injection 10 units subcu with each meals 10 mL 3  . insulin glargine (LANTUS) 100 UNIT/ML injection 32 units subcu daily 10 mL 3  . Levonorgestrel-Ethinyl Estradiol (AMETHIA) 0.15-0.03 &0.01 MG tablet Take 1 tablet by mouth daily. 1 Package 2  . TOUJEO MAX SOLOSTAR 300 UNIT/ML Solostar Pen SMARTSIG:60 Unit(s) SUB-Q Every Night    . valACYclovir (VALTREX) 500 MG tablet Take 1 tablet (500 mg total) by mouth daily. (Patient taking differently: Take 500 mg by mouth at bedtime. ) 90 tablet 2   No facility-administered medications prior to visit.      ROS:  Review of Systems  Constitutional: Negative for fever.  Gastrointestinal: Negative for blood in stool, constipation, diarrhea, nausea and vomiting.  Genitourinary: Positive for vaginal discharge. Negative for dyspareunia, dysuria, flank pain, frequency, hematuria, urgency, vaginal bleeding and vaginal pain.  Musculoskeletal: Negative for back pain.  Skin: Negative for rash.   BREAST: lumps   OBJECTIVE:   Vitals:  BP 130/90   Ht 5\' 6"  (1.676 m)   Wt (!) 302 lb (137 kg)   LMP 09/09/2019 (Approximate)   BMI 48.74 kg/m   Physical Exam Vitals reviewed.  Constitutional:      Appearance: She is well-developed.  Pulmonary:     Effort: Pulmonary effort is normal.  Chest:     Breasts: Breasts are symmetrical.        Right: No inverted nipple, mass, nipple discharge, skin change or tenderness.        Left: No inverted nipple, mass, nipple discharge, skin change or tenderness.    Genitourinary:    General: Normal vulva.     Pubic Area: No rash.      Labia:        Right: No rash, tenderness or lesion.         Left: No rash, tenderness or lesion.      Vagina: Vaginal discharge present. No erythema or tenderness.     Cervix: Normal.     Uterus: Normal. Not enlarged and not tender.      Adnexa: Right adnexa normal and left adnexa normal.       Right: No mass or tenderness.  Left: No mass or tenderness.    Musculoskeletal:        General: Normal range of motion.     Cervical back: Normal range of motion.  Skin:    General: Skin is warm and dry.  Neurological:     General: No focal deficit present.     Mental Status: She is alert and oriented to person, place, and time.     Cranial Nerves: No cranial nerve deficit.  Psychiatric:        Mood and Affect: Mood normal.        Behavior: Behavior normal.        Thought Content: Thought content normal.        Judgment: Judgment normal.     Results: Results for orders placed or performed in visit on 10/03/19 (from the past 24 hour(s))  POCT Wet Prep with KOH     Status: Abnormal   Collection Time: 10/03/19 10:47 AM  Result Value Ref Range   Trichomonas, UA Negative    Clue Cells Wet Prep HPF POC pos    Epithelial Wet Prep HPF POC     Yeast Wet Prep HPF POC neg    Bacteria Wet Prep HPF POC     RBC Wet Prep HPF POC     WBC Wet Prep HPF POC     KOH Prep POC Positive (A) Negative     Assessment/Plan: Bacterial vaginosis - Plan: metroNIDAZOLE (FLAGYL) 500 MG tablet; Pos sx and wet prep. Rx flagyl, no EtOH. F/u prn. Will RF if sx recur.  Vaginal itching - Plan: fluconazole (DIFLUCAN) 150 MG tablet; pos sx, neg wet prep. Hx of DM. Rx diflucan to treat empirically and with abx use. F/u prn.   Breast lesion--neg exam. Due for menses next wk. RTO in 3 wks/after menses if area still present. Will get dx mammo and u/s. If resolves, pt can cancel appt. F/u prn.   Herpes simplex vulvovaginitis - Plan: valACYclovir (VALTREX) 500 MG tablet; Rx RF.    Meds ordered this encounter  Medications  . metroNIDAZOLE (FLAGYL) 500 MG tablet     Sig: Take 1 tablet (500 mg total) by mouth 2 (two) times daily for 7 days.    Dispense:  14 tablet    Refill:  0    Order Specific Question:   Supervising Provider    Answer:   Gae Dry [id number]  . fluconazole (DIFLUCAN) 150 MG tablet    Sig: Take 1 tablet (150 mg total) by mouth once for 1 dose.    Dispense:  1 tablet    Refill:  0    Order Specific Question:   Supervising Provider    Answer:   Gae Dry [id number]  . valACYclovir (VALTREX) 500 MG tablet    Sig: Take 1 tablet (500 mg total) by mouth daily.    Dispense:  90 tablet    Refill:  2    Order Specific Question:   Supervising Provider    Answer:   Gae Dry [[id number]]      Return in about 3 weeks (around 10/24/2019) for breast f/u.  Montavius Subramaniam B. Ceana Fiala, PA-C 10/03/2019 10:50 AM

## 2019-10-03 ENCOUNTER — Other Ambulatory Visit: Payer: Self-pay

## 2019-10-03 ENCOUNTER — Ambulatory Visit (INDEPENDENT_AMBULATORY_CARE_PROVIDER_SITE_OTHER): Payer: BLUE CROSS/BLUE SHIELD | Admitting: Obstetrics and Gynecology

## 2019-10-03 ENCOUNTER — Encounter: Payer: Self-pay | Admitting: Obstetrics and Gynecology

## 2019-10-03 VITALS — BP 130/90 | Ht 66.0 in | Wt 302.0 lb

## 2019-10-03 DIAGNOSIS — A6004 Herpesviral vulvovaginitis: Secondary | ICD-10-CM

## 2019-10-03 DIAGNOSIS — B9689 Other specified bacterial agents as the cause of diseases classified elsewhere: Secondary | ICD-10-CM | POA: Diagnosis not present

## 2019-10-03 DIAGNOSIS — N649 Disorder of breast, unspecified: Secondary | ICD-10-CM

## 2019-10-03 DIAGNOSIS — N898 Other specified noninflammatory disorders of vagina: Secondary | ICD-10-CM

## 2019-10-03 DIAGNOSIS — N760 Acute vaginitis: Secondary | ICD-10-CM | POA: Diagnosis not present

## 2019-10-03 LAB — POCT WET PREP WITH KOH
Clue Cells Wet Prep HPF POC: POSITIVE
KOH Prep POC: POSITIVE — AB
Trichomonas, UA: NEGATIVE
Yeast Wet Prep HPF POC: NEGATIVE

## 2019-10-03 MED ORDER — METRONIDAZOLE 500 MG PO TABS: 500.0000 mg | ORAL_TABLET | Freq: Two times a day (BID) | ORAL | 0 refills | Status: AC

## 2019-10-03 MED ORDER — FLUCONAZOLE 150 MG PO TABS: 150.0000 mg | ORAL_TABLET | Freq: Once | ORAL | 0 refills | Status: AC

## 2019-10-03 MED ORDER — VALACYCLOVIR HCL 500 MG PO TABS: 500.0000 mg | ORAL_TABLET | Freq: Every day | ORAL | 2 refills | Status: DC

## 2019-10-03 NOTE — Patient Instructions (Signed)
I value your feedback and entrusting us with your care. If you get a [facility] patient survey, I would appreciate you taking the time to let us know about your experience today. Thank you!  As of March 24, 2019, your lab results will be released to your MyChart immediately, before I even have a chance to see them. Please give me time to review them and contact you if there are any abnormalities. Thank you for your patience.  

## 2019-10-24 NOTE — Progress Notes (Deleted)
Simonne Martinet, MD   No chief complaint on file.   HPI:      [name] is a 29 y.o. G1P1001 whose LMP was No LMP recorded., presents today for ***    Past Medical History:  Diagnosis Date  . Complication of anesthesia    slow to wake after wisdom teeth extraction  . COVID-19 11/12/2018   tested (+) 11/16/18,  (-) 12/01/18  . Diabetes mellitus, type 2 (HCC)   . Family history of adverse reaction to anesthesia    Mother - slow to wake  . Genital herpes 10/2018   type 2 on culture  . History of chlamydia 08/2015  . History of Papanicolaou smear of cervix 04/22/13; 08/30/15   NEG CT/GC NEG; NEG CT-POS/GC/TR NEG  . History of vaginitis   . Hypercholesteremia   . Obesity (BMI 30.0-34.9)     Past Surgical History:  Procedure Laterality Date  . NASAL SEPTOPLASTY W/ TURBINOPLASTY Bilateral 03/04/2019   Procedure: NASAL SEPTOPLASTY WITH SUBMUCOUS RESECTION OF NASAL TURBINATES;  Surgeon: Linus Salmons, MD;  Location: [facility];  Service: ENT;  Laterality: Bilateral;  Diabetic - insulin  . VAGINA SURGERY     age 65 or 85  . WISDOM TOOTH EXTRACTION      Family History  Problem Relation Age of Onset  . Hypertension Mother   . Heart disease Maternal Grandmother   . Breast cancer Paternal Grandmother        breast cancer in late 40s-died from breast cancer  . Prostate cancer Paternal Grandfather   . Dementia Paternal Grandfather   . Parkinson's disease Paternal Grandfather     Social History   Socioeconomic History  . Marital status: Single    Spouse name: Not on file  . Number of children: 1  . Years of education: 63  . Highest education level: Not on file  Occupational History  . Occupation: Event organiser: [employer]  Tobacco Use  . Smoking status: Never Smoker  . Smokeless tobacco: Never Used  Vaping Use  . Vaping Use: Never used  Substance and Sexual Activity  . Alcohol use: Not Currently    Comment: OCC  . Drug use: No  .  Sexual activity: Yes    Partners: Male    Birth control/protection: Pill  Other Topics Concern  . Not on file  Social History Narrative  . Not on file   Social Determinants of Health   Financial Resource Strain:   . Difficulty of Paying Living Expenses:   Food Insecurity:   . Worried About Programme researcher, broadcasting/film/video in the Last Year:   . Barista in the Last Year:   Transportation Needs:   . Freight forwarder (Medical):   Marland Kitchen Lack of Transportation (Non-Medical):   Physical Activity:   . Days of Exercise per Week:   . Minutes of Exercise per Session:   Stress:   . Feeling of Stress :   Social Connections:   . Frequency of Communication with Friends and Family:   . Frequency of Social Gatherings with Friends and Family:   . Attends Religious Services:   . Active Member of Clubs or Organizations:   . Attends Banker Meetings:   Marland Kitchen Marital Status:   Intimate Partner Violence:   . Fear of Current or Ex-Partner:   . Emotionally Abused:   Marland Kitchen Physically Abused:   . Sexually Abused:     Outpatient Medications  Prior to Visit  Medication Sig Dispense Refill  . atorvastatin (LIPITOR) 40 MG tablet Take 40 mg by mouth daily.    . Continuous Blood Gluc Sensor (DEXCOM G6 SENSOR) MISC DISPENSE AND USE AS DIRECTED    . Continuous Blood Gluc Transmit (DEXCOM G6 TRANSMITTER) MISC DISPENSE AND USE AS DIRECTED    . glucose blood (ACCU-CHEK AVIVA PLUS) test strip Use to check blood sugars up to 6 times daily    . insulin aspart (NOVOLOG) 100 UNIT/ML injection 10 units subcu with each meals 10 mL 3  . insulin glargine (LANTUS) 100 UNIT/ML injection 32 units subcu daily 10 mL 3  . Levonorgestrel-Ethinyl Estradiol (AMETHIA) 0.15-0.03 &0.01 MG tablet Take 1 tablet by mouth daily. 1 Package 2  . TOUJEO MAX SOLOSTAR 300 UNIT/ML Solostar Pen SMARTSIG:60 Unit(s) SUB-Q Every Night    . valACYclovir (VALTREX) 500 MG tablet Take 1 tablet (500 mg total) by mouth daily. 90 tablet 2   No  facility-administered medications prior to visit.      ROS:  Review of Systems BREAST: No symptoms   OBJECTIVE:   Vitals:  There were no vitals taken for this visit.  Physical Exam  Results: No results found for this or any previous visit (from the past 24 hour(s)).   Assessment/Plan: No diagnosis found.    No orders of the defined types were placed in this encounter.     No follow-ups on file.  Hagen Tidd B. Kecia Swoboda, PA-C 10/24/2019 4:09 PM

## 2019-10-25 ENCOUNTER — Telehealth: Payer: Self-pay

## 2019-10-25 ENCOUNTER — Telehealth: Payer: Self-pay | Admitting: Obstetrics and Gynecology

## 2019-10-25 ENCOUNTER — Ambulatory Visit: Payer: BLUE CROSS/BLUE SHIELD | Admitting: Obstetrics and Gynecology

## 2019-10-25 DIAGNOSIS — Z1231 Encounter for screening mammogram for malignant neoplasm of breast: Secondary | ICD-10-CM

## 2019-10-25 DIAGNOSIS — N6459 Other signs and symptoms in breast: Secondary | ICD-10-CM

## 2019-10-25 NOTE — Telephone Encounter (Signed)
Pt had f/u breast appt today but missed it due to work mtg. Had CMA call pt and f/u. Pt with hx of LT breast thickness without discrete mass upper quadrant 10/03/19. Sx didn't resolve with menses. Will sched dx mammo and u/s and f/u with results. If neg, reassurance.

## 2019-10-25 NOTE — Addendum Note (Signed)
Addended by: Althea Grimmer B on: 10/25/2019 09:26 AM   Modules accepted: Orders

## 2019-10-25 NOTE — Telephone Encounter (Signed)
Pt missed appt this a.m. [facility] asked to call pt to ask if sx had resolved? Called pt and she said she missed appt due to meeting at work and had to be there. ABC has ordered mammo and u/s. Called pt, straight to voicemail. LVMTRC.

## 2019-11-02 ENCOUNTER — Ambulatory Visit
Admission: RE | Admit: 2019-11-02 | Discharge: 2019-11-02 | Disposition: A | Discharge status: Home / Self Care | Payer: BC Managed Care – PPO | Source: Ambulatory Visit | Attending: Obstetrics and Gynecology | Admitting: Obstetrics and Gynecology

## 2019-11-02 DIAGNOSIS — Z1231 Encounter for screening mammogram for malignant neoplasm of breast: Secondary | ICD-10-CM | POA: Diagnosis present

## 2019-11-02 DIAGNOSIS — N6459 Other signs and symptoms in breast: Secondary | ICD-10-CM | POA: Diagnosis present

## 2020-01-24 ENCOUNTER — Other Ambulatory Visit: Payer: Self-pay | Admitting: Internal Medicine

## 2020-01-24 ENCOUNTER — Ambulatory Visit: Payer: BC Managed Care – PPO | Attending: Internal Medicine

## 2020-01-24 DIAGNOSIS — Z23 Encounter for immunization: Secondary | ICD-10-CM

## 2020-01-24 NOTE — Progress Notes (Signed)
   Covid-19 Vaccination Clinic  Name:  Laurie Ortiz    MRN: [id number] DOB: [birth year]  01/24/2020  [name] was observed post Covid-19 immunization for 15 minutes without incident. She was provided with Vaccine Information Sheet and instruction to access the V-Safe system.   [name] was instructed to call 911 with any severe reactions post vaccine: Marland Kitchen Difficulty breathing  . Swelling of face and throat  . A fast heartbeat  . A bad rash all over body  . Dizziness and weakness

## 2020-02-02 DIAGNOSIS — E785 Hyperlipidemia, unspecified: Secondary | ICD-10-CM | POA: Insufficient documentation

## 2020-03-20 DIAGNOSIS — G4730 Sleep apnea, unspecified: Secondary | ICD-10-CM | POA: Insufficient documentation

## 2020-04-09 DIAGNOSIS — Z9884 Bariatric surgery status: Secondary | ICD-10-CM | POA: Insufficient documentation

## 2020-04-09 HISTORY — PX: GASTRIC BYPASS: SHX52

## 2020-04-26 ENCOUNTER — Other Ambulatory Visit: Payer: Self-pay

## 2020-04-26 ENCOUNTER — Other Ambulatory Visit (HOSPITAL_COMMUNITY)
Admission: RE | Admit: 2020-04-26 | Discharge: 2020-04-26 | Disposition: A | Discharge status: Home / Self Care | Payer: Medicaid Other | Source: Ambulatory Visit | Attending: Advanced Practice Midwife | Admitting: Advanced Practice Midwife

## 2020-04-26 ENCOUNTER — Encounter: Payer: Self-pay | Admitting: Advanced Practice Midwife

## 2020-04-26 ENCOUNTER — Ambulatory Visit (INDEPENDENT_AMBULATORY_CARE_PROVIDER_SITE_OTHER): Payer: 59 | Admitting: Advanced Practice Midwife

## 2020-04-26 VITALS — BP 122/70 | Ht 67.0 in | Wt 290.4 lb

## 2020-04-26 DIAGNOSIS — Z9989 Dependence on other enabling machines and devices: Secondary | ICD-10-CM | POA: Insufficient documentation

## 2020-04-26 DIAGNOSIS — Z30011 Encounter for initial prescription of contraceptive pills: Secondary | ICD-10-CM | POA: Diagnosis not present

## 2020-04-26 DIAGNOSIS — R300 Dysuria: Secondary | ICD-10-CM

## 2020-04-26 DIAGNOSIS — Z3041 Encounter for surveillance of contraceptive pills: Secondary | ICD-10-CM | POA: Diagnosis not present

## 2020-04-26 DIAGNOSIS — N898 Other specified noninflammatory disorders of vagina: Secondary | ICD-10-CM

## 2020-04-26 DIAGNOSIS — K30 Functional dyspepsia: Secondary | ICD-10-CM | POA: Insufficient documentation

## 2020-04-26 DIAGNOSIS — K2970 Gastritis, unspecified, without bleeding: Secondary | ICD-10-CM | POA: Insufficient documentation

## 2020-04-26 LAB — POCT URINALYSIS DIPSTICK
Bilirubin, UA: NEGATIVE
Blood, UA: NEGATIVE
Glucose, UA: NEGATIVE
Leukocytes, UA: NEGATIVE
Nitrite, UA: NEGATIVE
Protein, UA: POSITIVE — AB
Spec Grav, UA: >=1.03 — AB (ref 1.010–1.025)
Urobilinogen, UA: 0.2 E.U./dL
pH, UA: 5 (ref 5.0–8.0)

## 2020-04-26 MED ORDER — LEVONORGEST-ETH ESTRAD 91-DAY 0.15-0.03 &0.01 MG PO TABS: 1.0000 | ORAL_TABLET | Freq: Every day | ORAL | 3 refills | Status: DC

## 2020-04-26 MED ORDER — METRONIDAZOLE 0.75 % VA GEL: 1.0000 | Freq: Every day | VAGINAL | 1 refills | Status: AC

## 2020-04-26 MED ORDER — FLUCONAZOLE 150 MG PO TABS: 150.0000 mg | ORAL_TABLET | Freq: Once | ORAL | 3 refills | Status: AC

## 2020-04-26 NOTE — Progress Notes (Signed)
POSS UTI

## 2020-04-26 NOTE — Patient Instructions (Signed)
Urinary Tract Infection, Adult  A urinary tract infection (UTI) is an infection of any part of the urinary tract. The urinary tract includes the kidneys, ureters, bladder, and urethra. These organs make, store, and get rid of urine in the body. An upper UTI affects the ureters and kidneys. A lower UTI affects the bladder and urethra. What are the causes? Most urinary tract infections are caused by bacteria in your genital area around your urethra, where urine leaves your body. These bacteria grow and cause inflammation of your urinary tract. What increases the risk? You are more likely to develop this condition if:  You have a urinary catheter that stays in place.  You are not able to control when you urinate or have a bowel movement (incontinence).  You are female and you: ? Use a spermicide or diaphragm for birth control. ? Have low estrogen levels. ? Are pregnant.  You have certain genes that increase your risk.  You are sexually active.  You take antibiotic medicines.  You have a condition that causes your flow of urine to slow down, such as: ? An enlarged prostate, if you are female. ? Blockage in your urethra. ? A kidney stone. ? A nerve condition that affects your bladder control (neurogenic bladder). ? Not getting enough to drink, or not urinating often.  You have certain medical conditions, such as: ? Diabetes. ? A weak disease-fighting system (immunesystem). ? Sickle cell disease. ? Gout. ? Spinal cord injury. What are the signs or symptoms? Symptoms of this condition include:  Needing to urinate right away (urgency).  Frequent urination. This may include small amounts of urine each time you urinate.  Pain or burning with urination.  Blood in the urine.  Urine that smells bad or unusual.  Trouble urinating.  Cloudy urine.  Vaginal discharge, if you are female.  Pain in the abdomen or the lower back. You may also have:  Vomiting or a decreased  appetite.  Confusion.  Irritability or tiredness.  A fever or chills.  Diarrhea. The first symptom in older adults may be confusion. In some cases, they may not have any symptoms until the infection has worsened. How is this diagnosed? This condition is diagnosed based on your medical history and a physical exam. You may also have other tests, including:  Urine tests.  Blood tests.  Tests for STIs (sexually transmitted infections). If you have had more than one UTI, a cystoscopy or imaging studies may be done to determine the cause of the infections. How is this treated? Treatment for this condition includes:  Antibiotic medicine.  Over-the-counter medicines to treat discomfort.  Drinking enough water to stay hydrated. If you have frequent infections or have other conditions such as a kidney stone, you may need to see a health care provider who specializes in the urinary tract (urologist). In rare cases, urinary tract infections can cause sepsis. Sepsis is a life-threatening condition that occurs when the body responds to an infection. Sepsis is treated in the hospital with IV antibiotics, fluids, and other medicines. Follow these instructions at home: Medicines  Take over-the-counter and prescription medicines only as told by your health care provider.  If you were prescribed an antibiotic medicine, take it as told by your health care provider. Do not stop using the antibiotic even if you start to feel better. General instructions  Make sure you: ? Empty your bladder often and completely. Do not hold urine for long periods of time. ? Empty your bladder after   sex. ? Wipe from front to back after urinating or having a bowel movement if you are female. Use each tissue only one time when you wipe.  Drink enough fluid to keep your urine pale yellow.  Keep all follow-up visits. This is important.   Contact a health care provider if:  Your symptoms do not get better after 1-2  days.  Your symptoms go away and then return. Get help right away if:  You have severe pain in your back or your lower abdomen.  You have a fever or chills.  You have nausea or vomiting. Summary  A urinary tract infection (UTI) is an infection of any part of the urinary tract, which includes the kidneys, ureters, bladder, and urethra.  Most urinary tract infections are caused by bacteria in your genital area.  Treatment for this condition often includes antibiotic medicines.  If you were prescribed an antibiotic medicine, take it as told by your health care provider. Do not stop using the antibiotic even if you start to feel better.  Keep all follow-up visits. This is important. This information is not intended to replace advice given to you by your health care provider. Make sure you discuss any questions you have with your health care provider. Document Revised: 11/11/2019 Document Reviewed: 11/11/2019 Elsevier Patient Education  2021 [employer] Vaginitis  Vaginitis is a condition in which the vaginal tissue swells and becomes irritated. This condition is most often caused by a change in the normal balance of bacteria and yeast that live in the vagina. This change causes an overgrowth of certain bacteria or yeast, which causes the inflammation. There are different types of vaginitis. What are the causes? The cause of this condition depends on the type of vaginitis. It can be caused by:  Bacteria (bacterial vaginosis).  Yeast, which is a fungus (candidiasis).  A parasite (trichomoniasis vaginitis).  A virus (viral vaginitis).  Low hormone levels (atrophic vaginitis). Low hormone levels can occur during pregnancy, breastfeeding, or after menopause.  Irritants, such as bubble baths, scented tampons, and feminine sprays (allergic vaginitis). Other factors can change the normal balance of the yeast and bacteria that live in the vagina. These include:  Antibiotic  medicines.  Poor hygiene.  Diaphragms, vaginal sponges, spermicides, birth control pills, and intrauterine devices (IUDs).  Sex.  Infection.  Uncontrolled diabetes.  A weakened body defense system (immune system). What increases the risk? This condition is more likely to develop in women who:  Smoke or are exposed to secondhand smoke.  Use vaginal douches, scented tampons, or scented sanitary pads.  Wear tight-fitting pants or thong underwear.  Use oral birth control pills or an IUD.  Have sex without a condom or have multiple partners.  Have an STI.  Frequently use the spermicide nonoxynol-9.  Eat lots of foods high in sugar or who have uncontrolled diabetes.  Have low estrogen levels.  Have a weakened immune system from an immune disorder or medical treatment.  Are pregnant or breastfeeding. What are the signs or symptoms? Symptoms vary depending on the cause of the vaginitis. Common symptoms include:  Abnormal vaginal discharge. ? The discharge is white, gray, or yellow with bacterial vaginosis. ? The discharge is thick, white, and cheesy with a yeast infection. ? The discharge is frothy and yellow or greenish with trichomoniasis.  A bad vaginal smell. The smell is fishy with bacterial vaginosis.  Vaginal itching, pain, or swelling.  Pain with sex.  Pain or burning when urinating. Sometimes there  are no symptoms. How is this diagnosed? This condition is diagnosed based on your symptoms and medical history. A physical exam, including a pelvic exam, will also be done. You may also have other tests, including:  Tests to determine the pH level (acidity or alkalinity) of your vagina.  A whiff test to assess the odor that results when a sample of your vaginal discharge is mixed with a potassium hydroxide solution.  Tests of vaginal fluid. A sample will be examined under a microscope. How is this treated? Treatment varies depending on the type of vaginitis  you have. Your treatment may include:  Antibiotic creams or pills to treat bacterial vaginosis and trichomoniasis.  Antifungal medicines, such as vaginal creams or suppositories, to treat a yeast infection.  Medicine to ease discomfort if you have viral vaginitis. Your sexual partner should also be treated.  Estrogen delivered in a cream, pill, suppository, or vaginal ring to treat atrophic vaginitis. If vaginal dryness occurs, lubricants and moisturizing creams may help. You may need to avoid scented soaps, sprays, or douches.  Stopping use of a product that is causing allergic vaginitis and then using a vaginal cream to treat the symptoms. Follow these instructions at home: Lifestyle  Keep your genital area clean and dry. Avoid soap, and only rinse the area with water.  Do not douche or use tampons until your health care provider says it is okay. Use sanitary pads, if needed.  Do not have sex until your health care provider approves. When you can return to sex, practice safe sex and use condoms.  Wipe from front to back. This avoids the spread of bacteria from the rectum to the vagina. General instructions  Take over-the-counter and prescription medicines only as told by your health care provider.  If you were prescribed an antibiotic medicine, take or use it as told by your health care provider. Do not stop taking or using the antibiotic even if you start to feel better.  Keep all follow-up visits. This is important. How is this prevented?  Use mild, unscented products. Do not use things that can irritate the vagina, such as fabric softeners. Avoid the following products if they are scented: ? Feminine sprays. ? Detergents. ? Tampons. ? Feminine hygiene products. ? Soaps or bubble baths.  Let air reach your genital area. To do this: ? Wear cotton underwear to reduce moisture buildup. ? Avoid wearing underwear while you sleep. ? Avoid wearing tight pants and underwear or  nylons without a cotton panel. ? Avoid wearing thong underwear.  Take off any wet clothing, such as bathing suits, as soon as possible.  Practice safe sex and use condoms. Contact a health care provider if:  You have abdominal or pelvic pain.  You have a fever or chills.  You have symptoms that last for more than 2-3 days. Get help right away if:  You have a fever and your symptoms suddenly get worse. Summary  Vaginitis is a condition in which the vaginal tissue becomes inflamed.This condition is most often caused by a change in the normal balance of bacteria and yeast that live in the vagina.  Treatment varies depending on the type of vaginitis you have.  Do not douche, use tampons, or have sex until your health care provider approves. When you can return to sex, practice safe sex and use condoms. This information is not intended to replace advice given to you by your health care provider. Make sure you discuss any questions you have  with your health care provider. Document Revised: 09/29/2019 Document Reviewed: 09/29/2019 Elsevier Patient Education  2021 [employer].

## 2020-04-26 NOTE — Progress Notes (Signed)
Patient ID: Laurie Ortiz, female   DOB: 03-May-1990, 30 y.o.   MRN: [id number]  Reason for Consult: Gynecologic Exam (Pt thinks she may have a UTI )    Subjective:  HPI:  Laurie Ortiz is a 30 y.o. female being seen for symptoms of possible UTI vs Vaginitis. She had gastric bypass surgery at the end of December and her symptoms began shortly after that. She has some pain with urination and odor of urine. She also has vaginal symptoms of odor, irritation and itching. She denies discharge. Patient had been attempting conception prior to her surgery. Her surgeon recommends a wait of 8-12 months before resuming. She requests a refill of her birth control.  Past Medical History:  Diagnosis Date  . Complication of anesthesia    slow to wake after wisdom teeth extraction  . COVID-19 11/12/2018   tested (+) 11/16/18,  (-) 12/01/18  . Diabetes mellitus, type 2 (HCC)   . Family history of adverse reaction to anesthesia    Mother - slow to wake  . Genital herpes 10/2018   type 2 on culture  . History of chlamydia 08/2015  . History of Papanicolaou smear of cervix 04/22/13; 08/30/15   NEG CT/GC NEG; NEG CT-POS/GC/TR NEG  . History of vaginitis   . Hypercholesteremia   . Obesity (BMI 30.0-34.9)    Family History  Problem Relation Age of Onset  . Hypertension Mother   . Heart disease Maternal Grandmother   . Breast cancer Paternal Grandmother        breast cancer in late 40s-died from breast cancer  . Prostate cancer Paternal Grandfather   . Dementia Paternal Grandfather   . Parkinson's disease Paternal Grandfather    Past Surgical History:  Procedure Laterality Date  . GASTRIC BYPASS  04/09/2020  . NASAL SEPTOPLASTY W/ TURBINOPLASTY Bilateral 03/04/2019   Procedure: NASAL SEPTOPLASTY WITH SUBMUCOUS RESECTION OF NASAL TURBINATES;  Surgeon: Linus Salmons, MD;  Location[facility];  Service: ENT;  Laterality: Bilateral;  Diabetic - insulin  . VAGINA SURGERY     age 22  or 45  . WISDOM TOOTH EXTRACTION      Short Social History:  Social History   Tobacco Use  . Smoking status: Never Smoker  . Smokeless tobacco: Never Used  Substance Use Topics  . Alcohol use: Not Currently    Comment: OCC    Allergies  Allergen Reactions  . Penicillins Hives and Rash    Did it involve swelling of the face/tongue/throat, SOB, or low BP? No Did it involve sudden or severe rash/hives, skin peeling, or any reaction on the inside of your mouth or nose? Yes Did you need to seek medical attention at a hospital or doctor's office? No When did it last happen?1998 If all above answers are "NO", may proceed with cephalosporin use.  Doesn't take any medications ending in "cillin"  . Amoxicillin Hives and Rash    (rash 09/1996)    Current Outpatient Medications  Medication Sig Dispense Refill  . atorvastatin (LIPITOR) 40 MG tablet Take 40 mg by mouth daily.    Marland Kitchen b complex vitamins capsule Take by mouth.    . Continuous Blood Gluc Sensor (DEXCOM G6 SENSOR) MISC DISPENSE AND USE AS DIRECTED    . Continuous Blood Gluc Transmit (DEXCOM G6 TRANSMITTER) MISC DISPENSE AND USE AS DIRECTED    . fluconazole (DIFLUCAN) 150 MG tablet Take 1 tablet (150 mg total) by mouth once for 1 dose. Can take additional  dose three days later if symptoms persist 1 tablet 3  . glucose blood (ACCU-CHEK AVIVA PLUS) test strip Use to check blood sugars up to 6 times daily    . metroNIDAZOLE (METROGEL) 0.75 % vaginal gel Place 1 Applicatorful vaginally at bedtime for 5 days. 70 g 1  . TOUJEO MAX SOLOSTAR 300 UNIT/ML Solostar Pen SMARTSIG:60 Unit(s) SUB-Q Every Night    . valACYclovir (VALTREX) 500 MG tablet Take 1 tablet (500 mg total) by mouth daily. 90 tablet 2  . ELIQUIS 2.5 MG TABS tablet Take 2.5 mg by mouth 2 (two) times daily.    . insulin aspart (NOVOLOG) 100 UNIT/ML injection 10 units subcu with each meals 10 mL 3  . insulin glargine (LANTUS) 100 UNIT/ML injection 32 units subcu daily 10 mL  3  . Levonorgestrel-Ethinyl Estradiol (AMETHIA) 0.15-0.03 &0.01 MG tablet Take 1 tablet by mouth daily. 90 tablet 3  . metFORMIN (GLUCOPHAGE) 500 MG tablet Take 500 mg by mouth 2 (two) times daily.    . ondansetron (ZOFRAN-ODT) 4 MG disintegrating tablet Take 4 mg by mouth every 8 (eight) hours as needed.    . pantoprazole (PROTONIX) 40 MG tablet Take 40 mg by mouth daily.     No current facility-administered medications for this visit.    Review of Systems  Constitutional: Negative for chills and fever.  HENT: Negative for congestion, ear discharge, ear pain, hearing loss, sinus pain and sore throat.   Eyes: Negative for blurred vision and double vision.  Respiratory: Negative for cough, shortness of breath and wheezing.   Cardiovascular: Negative for chest pain, palpitations and leg swelling.  Gastrointestinal: Positive for nausea. Negative for abdominal pain, blood in stool, constipation, diarrhea, heartburn, melena and vomiting.  Genitourinary: Positive for dysuria. Negative for flank pain, frequency, hematuria and urgency.       Positive for vaginal odor, irritation, itching  Musculoskeletal: Negative for back pain, joint pain and myalgias.  Skin: Negative for itching and rash.  Neurological: Negative for dizziness, tingling, tremors, sensory change, speech change, focal weakness, seizures, loss of consciousness, weakness and headaches.  Endo/Heme/Allergies: Negative for environmental allergies. Does not bruise/bleed easily.  Psychiatric/Behavioral: Negative for depression, hallucinations, memory loss, substance abuse and suicidal ideas. The patient is not nervous/anxious and does not have insomnia.         Objective:  Objective   Vitals:   04/26/20 0913  BP: 122/70  Weight: 290 lb 6.4 oz (131.7 kg)  Height: 5\' 7"  (1.702 m)   Body mass index is 45.48 kg/m. Constitutional: Well nourished, well developed female in no acute distress.  HEENT: normal Skin: Warm and dry.   Respiratory:  Normal respiratory effort Back: no CVAT Neuro: DTRs 2+, Cranial nerves grossly intact Psych: Alert and Oriented x3. No memory deficits. Normal mood and affect.  MS: normal gait, normal bilateral lower extremity ROM/strength/stability.  Pelvic exam:  is not limited by body habitus EGBUS: within normal limits Vagina: within normal limits and with normal mucosa, thin white discharge Cervix: not evaluated   Data: Results for GURNOOR, SLOOP (MRN Laurie Ortiz) as of 04/26/2020 10:05  Ref. Range 04/26/2020 09:21  Bilirubin, UA Unknown negative  Glucose Latest Ref Range: Negative  Negative  Ketones, UA Unknown large  Leukocytes,UA Latest Ref Range: Negative  Negative  Nitrite, UA Unknown negative  pH, UA Latest Ref Range: 5.0 - 8.0  5.0  Protein,UA Latest Ref Range: Negative  Positive (A)  Specific Gravity, UA Latest Ref Range: 1.010 - 1.025  >=1.030 (A)  Urobilinogen, UA Latest Ref Range: 0.2 or 1.0 E.U./dL 0.2  RBC, UA Unknown negative   Assessment/Plan:   30 y.o. G1 P66 female with dehydration and possible BV  Urine Culture Aptima-vaginitis Rx Metronidazole gel to treat emprirically Rx Diflucan if yeast develops Increase hydration- small amounts throughout the day (gastirc bypass) Refill of generic Seasonique   Tresea Mall CNM [facility] 04/26/2020, 10:09 AM

## 2020-04-27 LAB — CERVICOVAGINAL ANCILLARY ONLY
Bacterial Vaginitis (gardnerella): POSITIVE — AB
Candida Glabrata: NEGATIVE
Candida Vaginitis: NEGATIVE
Comment: NEGATIVE
Comment: NEGATIVE
Comment: NEGATIVE

## 2020-04-28 LAB — URINE CULTURE

## 2020-05-14 ENCOUNTER — Ambulatory Visit: Payer: BC Managed Care – PPO | Admitting: Obstetrics and Gynecology

## 2020-06-11 ENCOUNTER — Telehealth: Payer: Self-pay

## 2020-06-11 NOTE — Telephone Encounter (Signed)
Pt calling; is pretty sure she has a UTI - urine is dark, has odor, and back hurts extremely bad; is to work all week and not sure when she could come in; can a rx be called in without her being seen or what to do?  Pt has had gastric bypass so needs rx in capsule or liquid form.  [phone]

## 2020-06-12 ENCOUNTER — Other Ambulatory Visit: Payer: Self-pay | Admitting: Advanced Practice Midwife

## 2020-06-12 DIAGNOSIS — R300 Dysuria: Secondary | ICD-10-CM

## 2020-06-12 MED ORDER — NITROFURANTOIN MONOHYD MACRO 100 MG PO CAPS: 100.0000 mg | ORAL_CAPSULE | Freq: Two times a day (BID) | ORAL | 0 refills | Status: AC

## 2020-06-12 NOTE — Progress Notes (Signed)
Rx macrobid sent. Message to patient to drop off urine prior to taking medication.

## 2020-06-12 NOTE — Telephone Encounter (Signed)
JEG sent pt MyChart msg and an rx.

## 2020-10-19 ENCOUNTER — Other Ambulatory Visit: Payer: Self-pay | Admitting: Obstetrics and Gynecology

## 2020-10-19 DIAGNOSIS — A6004 Herpesviral vulvovaginitis: Secondary | ICD-10-CM

## 2020-11-27 IMAGING — MG DIGITAL DIAGNOSTIC BILAT W/ TOMO W/ CAD
8 of 16 series · 8 of 40 positions shown · non-contrast
Comparison: None.

CLINICAL DATA: 29-year-old female with a palpable area of concern
in the left breast.

EXAM:
DIGITAL DIAGNOSTIC BILATERAL MAMMOGRAM WITH TOMO AND CAD; ULTRASOUND
LEFT BREAST LIMITED

[R CC synth-2D]
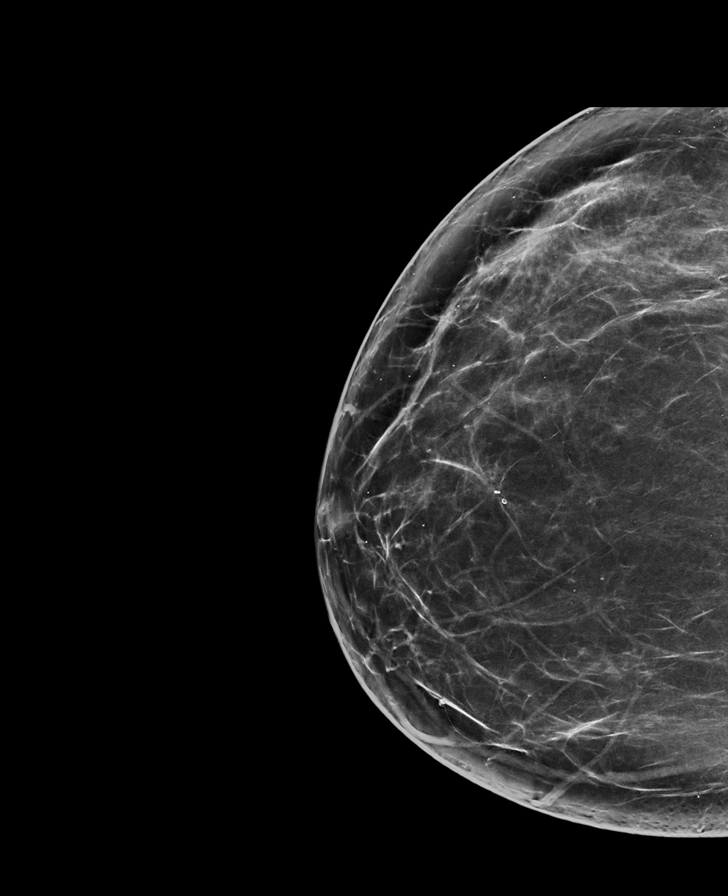

[L CC synth-2D (1 of 3)]
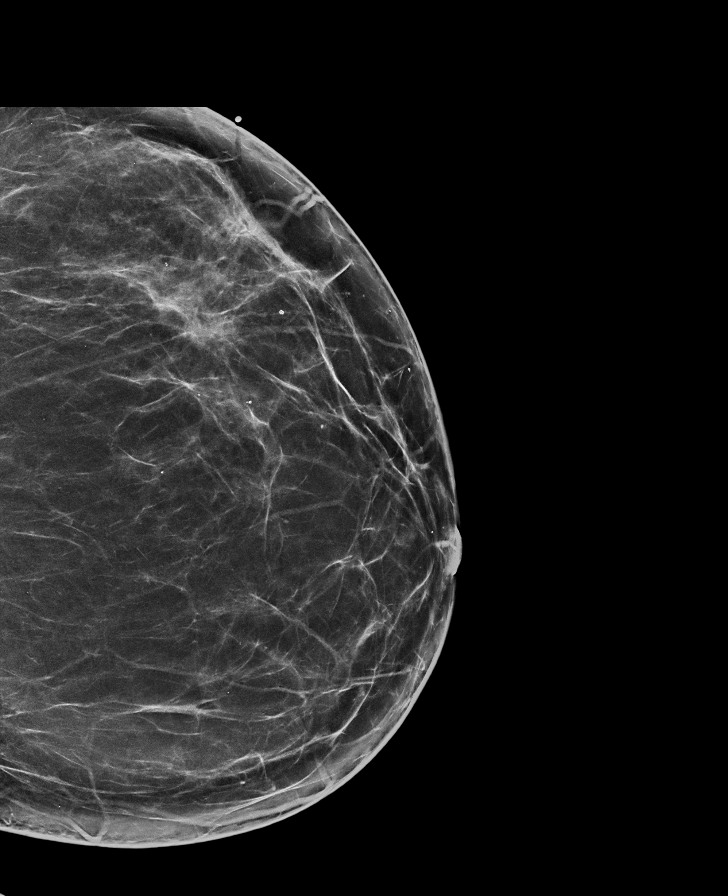

[L CC synth-2D (2 of 3)]
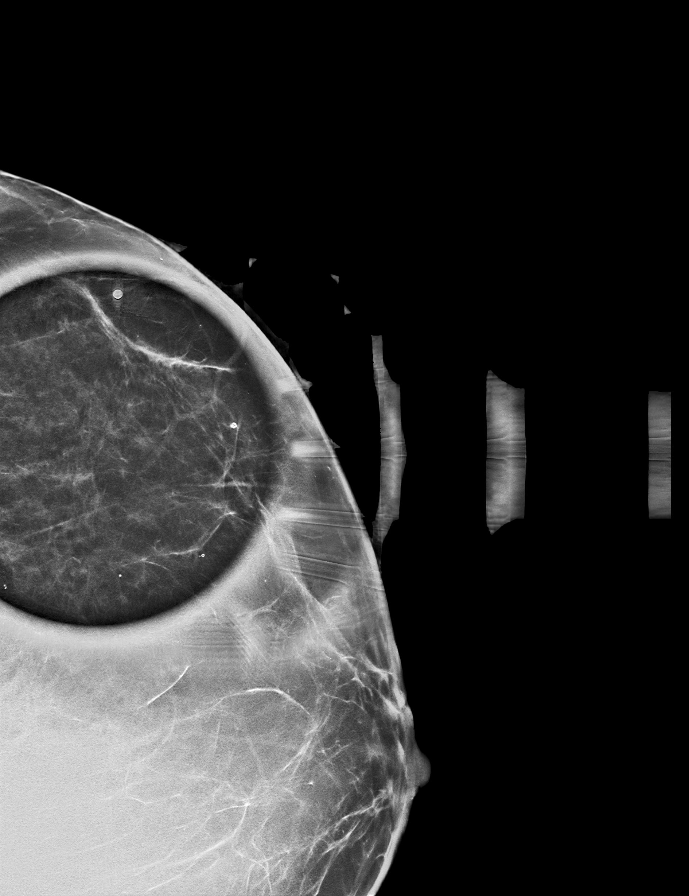

[R MLO synth-2D]
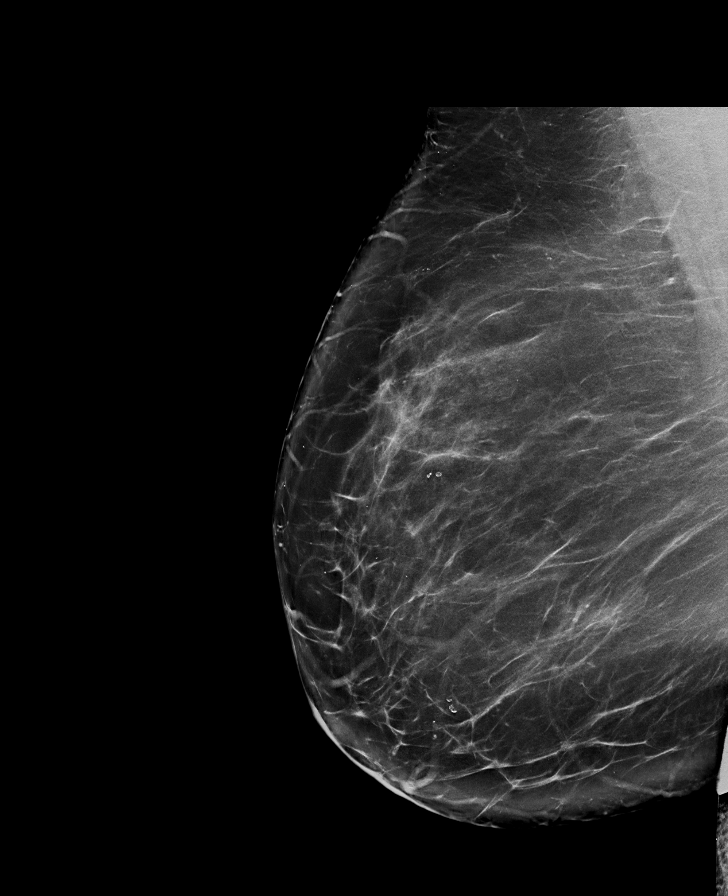

[L TAN synth-2D]
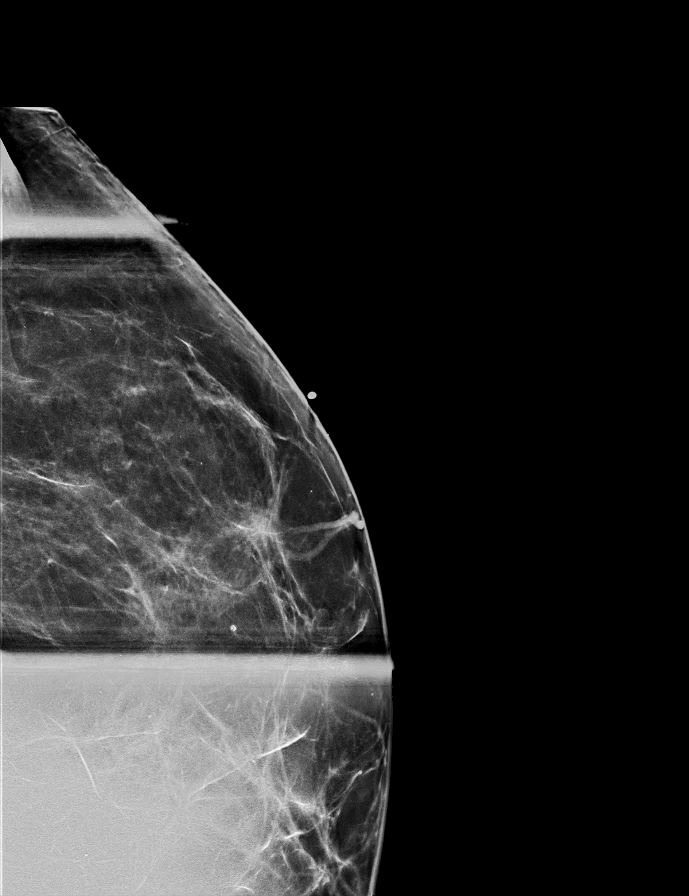

[L MLO synth-2D]
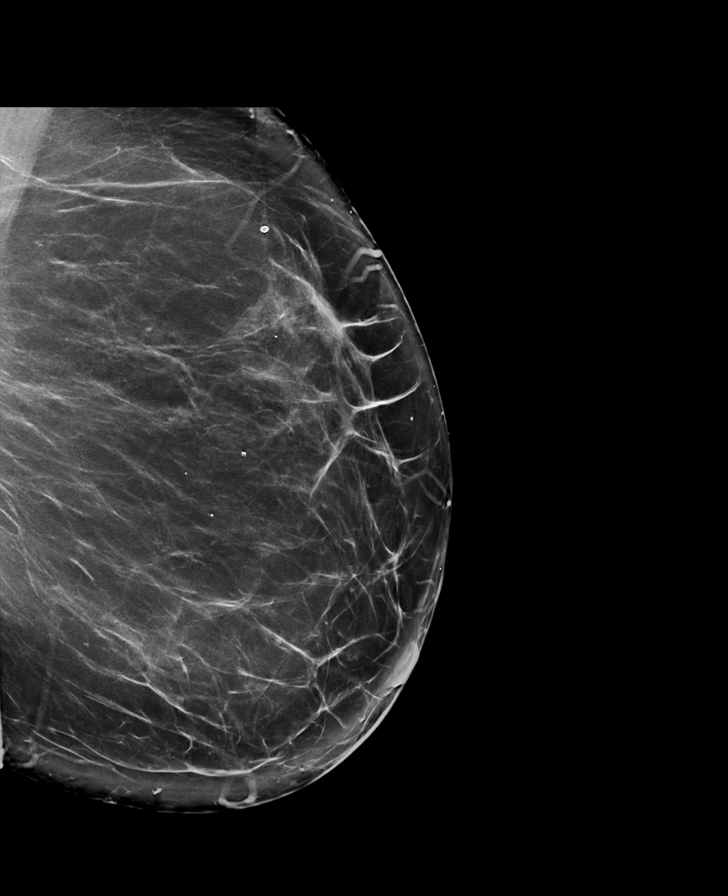

[L CC synth-2D (3 of 3)]
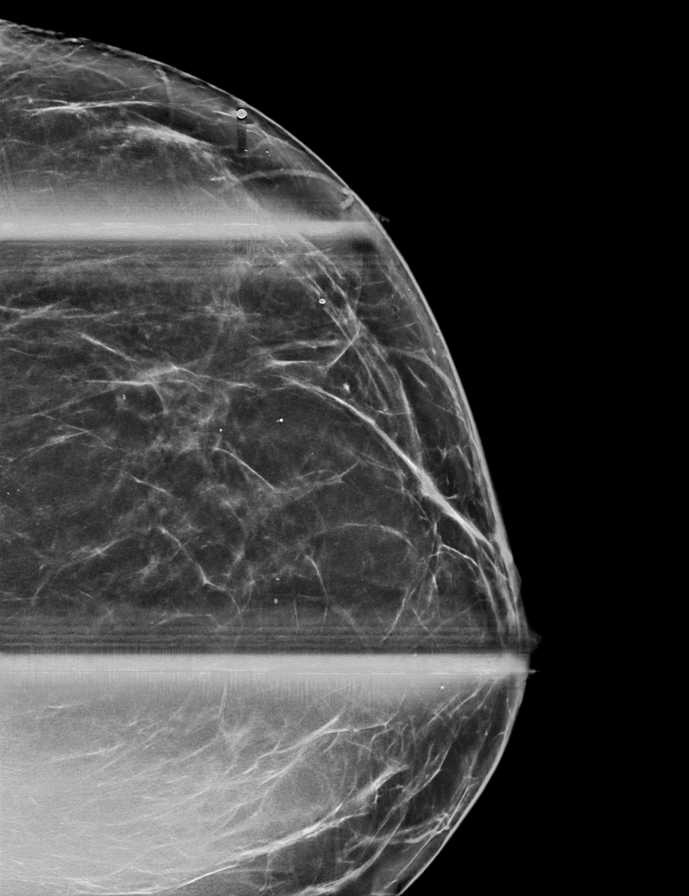

[R CC tomo · tomo slice 42/83.0]
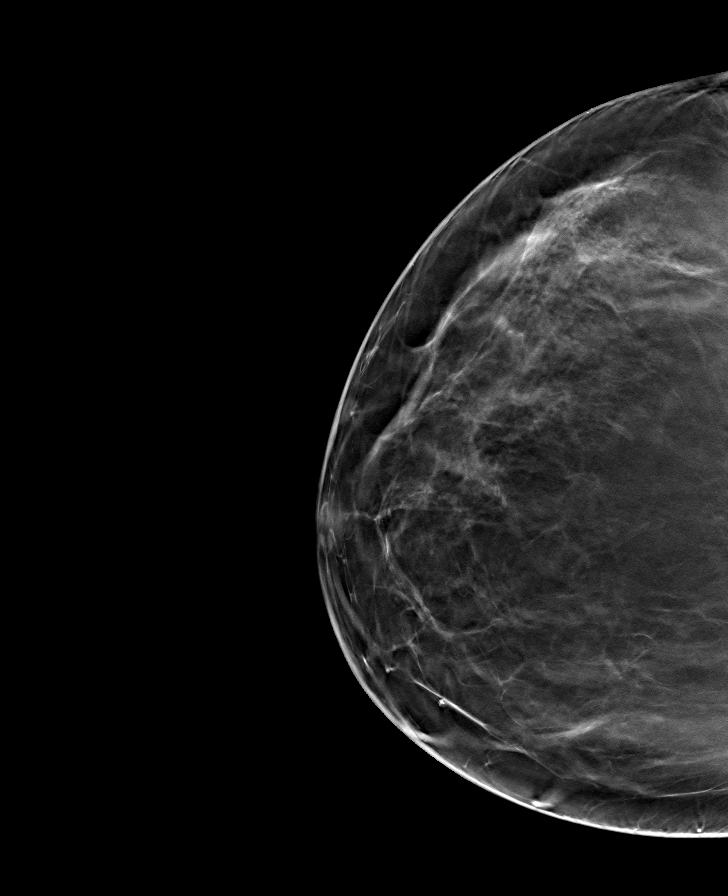

[8 of 40 positions shown; findings below may reference images not displayed]

ACR Breast Density Category b: There are scattered areas of
fibroglandular density.
FINDINGS: No suspicious masses or calcifications are seen in either breast. An
initially questioned asymmetry seen in the upper-outer left breast
resolves on the additional imaging with findings compatible with an
area of overlapping fibroglandular tissue. Spot compression
tangential tomograms were performed over the palpable area of
concern in the upper-outer left breast with no definite abnormality
seen.

Mammographic images were processed with CAD.

Physical examination at the site of palpable concern in the
upper-outer left breast does not reveal any discrete underlying
palpable masses.

Targeted ultrasound of the left breast was performed. No suspicious
masses or abnormality seen, only normal-appearing fibroglandular
tissue identified.
IMPRESSION: 1. No mammographic or sonographic abnormalities at the site of
palpable concern in the left breast.

2.  No mammographic evidence of malignancy in either breast.

RECOMMENDATION:
1. Recommend further management of the left breast palpable area of
concern be based on clinical assessment.

2. Screening mammogram at age 40 unless there are persistent or
intervening clinical concerns. (Code:QF-8-YQF)

I have discussed the findings and recommendations with the patient.
If applicable, a reminder letter will be sent to the patient
regarding the next appointment.

BI-RADS CATEGORY  1: Negative.

## 2020-11-27 IMAGING — US US BREAST*L* LIMITED INC AXILLA
1 series · 3 of 3 positions shown · non-contrast
Comparison: None.

CLINICAL DATA: 29-year-old female with a palpable area of concern
in the left breast.

EXAM:
DIGITAL DIAGNOSTIC BILATERAL MAMMOGRAM WITH TOMO AND CAD; ULTRASOUND
LEFT BREAST LIMITED

[Series 1: us breast*left* limited inc axilla · 0.09mm/px · 3 of 3 slices shown]
[im 1/3]
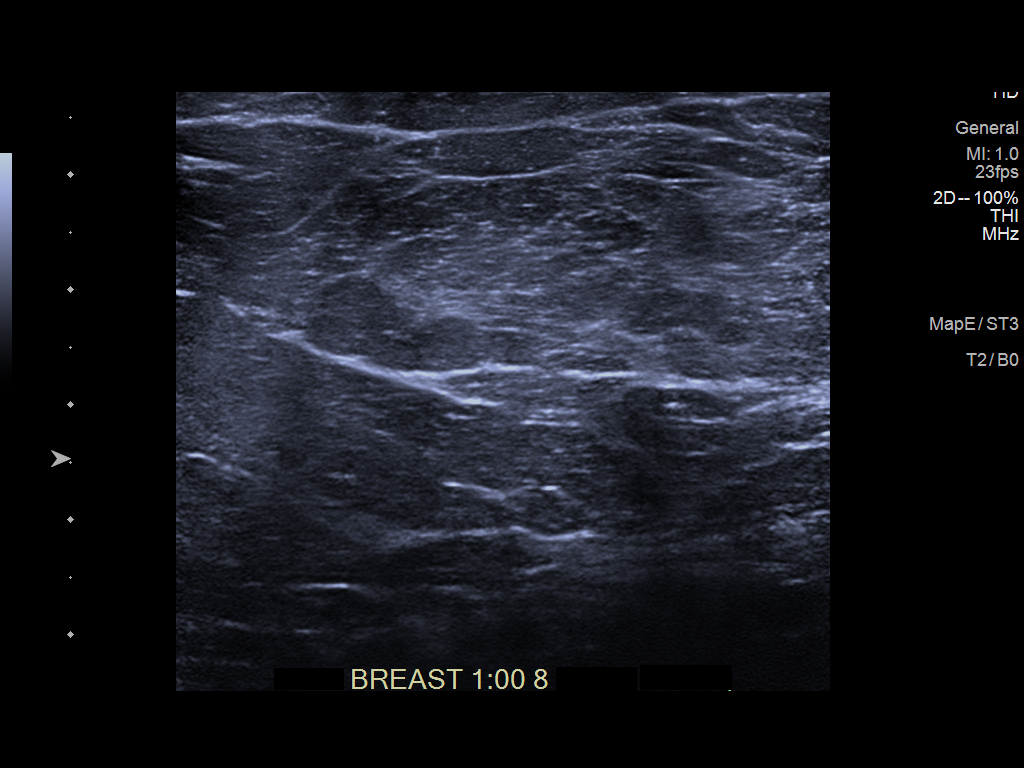
[im 2/3]
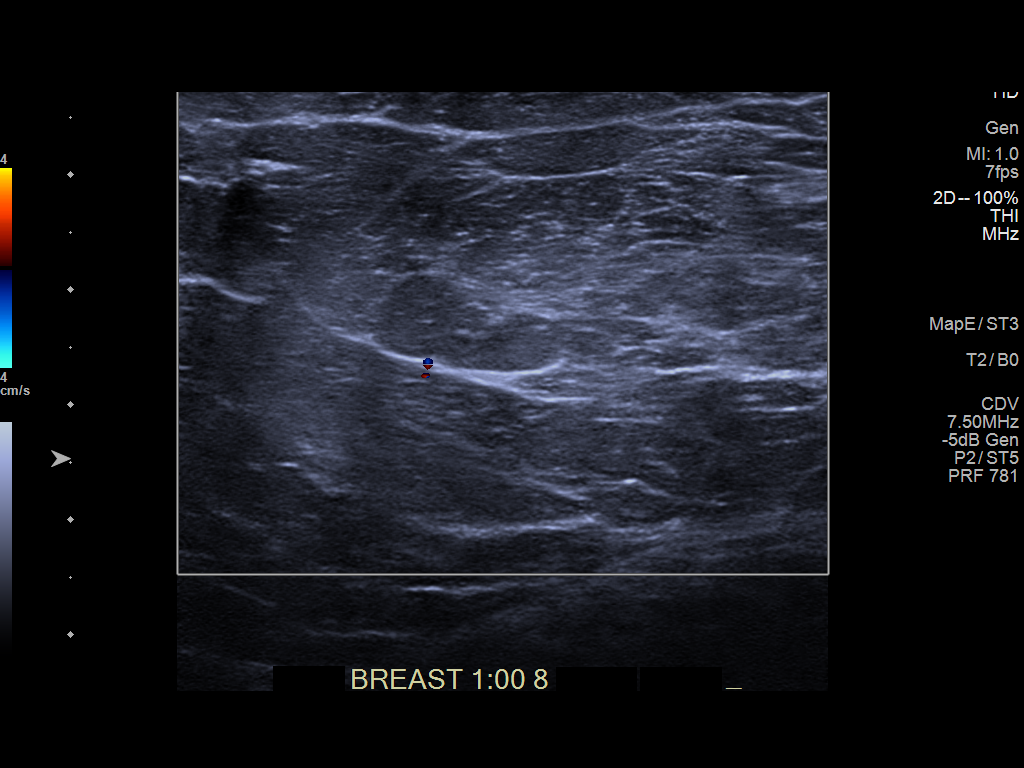
[im 3/3]
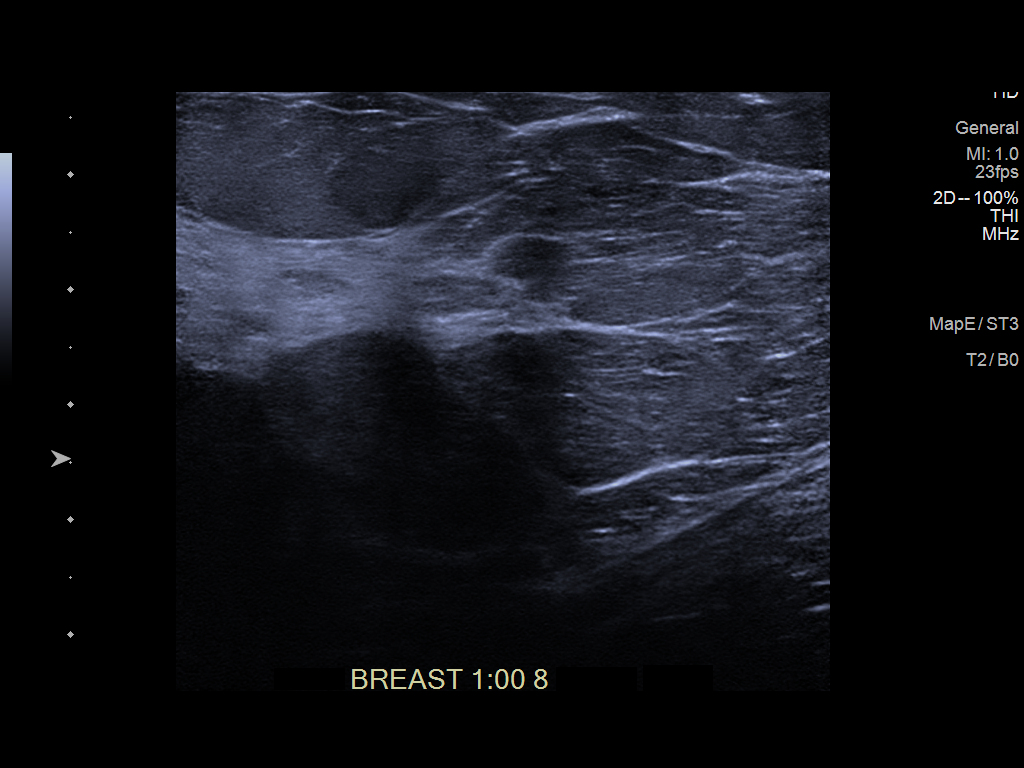

[3 of 3 positions shown; findings below may reference images not displayed]

ACR Breast Density Category b: There are scattered areas of
fibroglandular density.
FINDINGS: No suspicious masses or calcifications are seen in either breast. An
initially questioned asymmetry seen in the upper-outer left breast
resolves on the additional imaging with findings compatible with an
area of overlapping fibroglandular tissue. Spot compression
tangential tomograms were performed over the palpable area of
concern in the upper-outer left breast with no definite abnormality
seen.

Mammographic images were processed with CAD.

Physical examination at the site of palpable concern in the
upper-outer left breast does not reveal any discrete underlying
palpable masses.

Targeted ultrasound of the left breast was performed. No suspicious
masses or abnormality seen, only normal-appearing fibroglandular
tissue identified.
IMPRESSION: 1. No mammographic or sonographic abnormalities at the site of
palpable concern in the left breast.

2.  No mammographic evidence of malignancy in either breast.

RECOMMENDATION:
1. Recommend further management of the left breast palpable area of
concern be based on clinical assessment.

2. Screening mammogram at age 40 unless there are persistent or
intervening clinical concerns. (Code:QF-8-YQF)

I have discussed the findings and recommendations with the patient.
If applicable, a reminder letter will be sent to the patient
regarding the next appointment.

BI-RADS CATEGORY  1: Negative.

## 2021-01-14 ENCOUNTER — Telehealth: Payer: Self-pay

## 2021-01-14 ENCOUNTER — Other Ambulatory Visit: Payer: Self-pay | Admitting: Obstetrics and Gynecology

## 2021-01-14 DIAGNOSIS — A6004 Herpesviral vulvovaginitis: Secondary | ICD-10-CM

## 2021-01-14 MED ORDER — VALACYCLOVIR HCL 500 MG PO TABS: 500.0000 mg | ORAL_TABLET | Freq: Every day | ORAL | 0 refills | Status: DC

## 2021-01-14 NOTE — Telephone Encounter (Signed)
Pt aware.

## 2021-01-14 NOTE — Telephone Encounter (Signed)
Rx RF eRxd. Will do annual 1/23, 1 yr after she saw JEG for OCPs.

## 2021-01-14 NOTE — Telephone Encounter (Signed)
Pt is requesting valtrex RF. She is due for annual right?

## 2021-01-14 NOTE — Progress Notes (Signed)
Rx RF valtrex till annual 1/23

## 2021-02-16 ENCOUNTER — Encounter: Payer: Self-pay | Admitting: Emergency Medicine

## 2021-02-16 ENCOUNTER — Other Ambulatory Visit: Payer: Self-pay

## 2021-02-16 DIAGNOSIS — E109 Type 1 diabetes mellitus without complications: Secondary | ICD-10-CM | POA: Diagnosis not present

## 2021-02-16 DIAGNOSIS — Z8616 Personal history of COVID-19: Secondary | ICD-10-CM | POA: Diagnosis not present

## 2021-02-16 DIAGNOSIS — R110 Nausea: Secondary | ICD-10-CM | POA: Insufficient documentation

## 2021-02-16 DIAGNOSIS — Z7901 Long term (current) use of anticoagulants: Secondary | ICD-10-CM | POA: Insufficient documentation

## 2021-02-16 DIAGNOSIS — E162 Hypoglycemia, unspecified: Secondary | ICD-10-CM | POA: Insufficient documentation

## 2021-02-16 DIAGNOSIS — R42 Dizziness and giddiness: Secondary | ICD-10-CM | POA: Insufficient documentation

## 2021-02-16 DIAGNOSIS — Z79899 Other long term (current) drug therapy: Secondary | ICD-10-CM | POA: Insufficient documentation

## 2021-02-16 DIAGNOSIS — R1110 Vomiting, unspecified: Secondary | ICD-10-CM | POA: Diagnosis not present

## 2021-02-16 DIAGNOSIS — Z7984 Long term (current) use of oral hypoglycemic drugs: Secondary | ICD-10-CM | POA: Insufficient documentation

## 2021-02-16 LAB — URINALYSIS, ROUTINE W REFLEX MICROSCOPIC
Bilirubin Urine: NEGATIVE
Glucose, UA: NEGATIVE mg/dL
Hgb urine dipstick: NEGATIVE
Ketones, ur: 5 mg/dL — AB
Leukocytes,Ua: NEGATIVE
Nitrite: NEGATIVE
Protein, ur: NEGATIVE mg/dL
Specific Gravity, Urine: 1.032 — ABNORMAL HIGH (ref 1.005–1.030)
pH: 5 (ref 5.0–8.0)

## 2021-02-16 LAB — CBC
HCT: 40.5 % (ref 36.0–46.0)
Hemoglobin: 13.5 g/dL (ref 12.0–15.0)
MCH: 30.6 pg (ref 26.0–34.0)
MCHC: 33.3 g/dL (ref 30.0–36.0)
MCV: 91.8 fL (ref 80.0–100.0)
Platelets: 296 10*3/uL (ref 150–400)
RBC: 4.41 MIL/uL (ref 3.87–5.11)
RDW: 12.9 % (ref 11.5–15.5)
WBC: 6.1 10*3/uL (ref 4.0–10.5)
nRBC: 0 % (ref 0.0–0.2)

## 2021-02-16 LAB — COMPREHENSIVE METABOLIC PANEL
ALT: 17 U/L (ref 0–44)
AST: 21 U/L (ref 15–41)
Albumin: 4.3 g/dL (ref 3.5–5.0)
Alkaline Phosphatase: 75 U/L (ref 38–126)
Anion gap: 6 (ref 5–15)
BUN: 16 mg/dL (ref 6–20)
CO2: 25 mmol/L (ref 22–32)
Calcium: 9 mg/dL (ref 8.9–10.3)
Chloride: 109 mmol/L (ref 98–111)
Creatinine, Ser: 0.74 mg/dL (ref 0.44–1.00)
GFR, Estimated: >60 mL/min (ref >60)
Glucose, Bld: 50 mg/dL — ABNORMAL LOW (ref 70–99)
Potassium: 3.4 mmol/L — ABNORMAL LOW (ref 3.5–5.1)
Sodium: 140 mmol/L (ref 135–145)
Total Bilirubin: 0.9 mg/dL (ref 0.3–1.2)
Total Protein: 8 g/dL (ref 6.5–8.1)

## 2021-02-16 LAB — CBG MONITORING, ED
Glucose-Capillary: 48 mg/dL — ABNORMAL LOW (ref 70–99)
Glucose-Capillary: 70 mg/dL (ref 70–99)

## 2021-02-16 LAB — POC URINE PREG, ED: Preg Test, Ur: NEGATIVE

## 2021-02-16 MED ORDER — DEXTROSE 50 % IV SOLN
50.0000 mL | Freq: Once | INTRAVENOUS | Status: DC
  Filled 2021-02-16: qty 50

## 2021-02-16 NOTE — ED Notes (Signed)
This RN attempted to get an IV in place unable to get one

## 2021-02-16 NOTE — ED Triage Notes (Signed)
Patient states that she woke up this morning feeling dizzy. Patient reports that she was seen by ems this morning and her blood sugar was 31. Patient was given dextrose by ems. Patient states that she has continued to feel dizzy and nauseated. Patient reports that she has been checking her blood sugar and it has been normal. Patient states that she is a type 1 diabetic and no changes to her medications.

## 2021-02-17 ENCOUNTER — Emergency Department
Admission: EM | Admit: 2021-02-17 | Discharge: 2021-02-17 | Disposition: A | Discharge status: Home / Self Care | Payer: Commercial Managed Care - PPO | Attending: Emergency Medicine | Admitting: Emergency Medicine

## 2021-02-17 DIAGNOSIS — R42 Dizziness and giddiness: Secondary | ICD-10-CM

## 2021-02-17 DIAGNOSIS — E162 Hypoglycemia, unspecified: Secondary | ICD-10-CM

## 2021-02-17 HISTORY — DX: Type 2 diabetes mellitus without complications: E11.9

## 2021-02-17 LAB — CBG MONITORING, ED
Glucose-Capillary: 124 mg/dL — ABNORMAL HIGH (ref 70–99)
Glucose-Capillary: 131 mg/dL — ABNORMAL HIGH (ref 70–99)

## 2021-02-17 NOTE — Discharge Instructions (Addendum)
Your labs, urine were reassuring today other than low blood glucose which has improved.  Please increase your fluid intake at home.  Please follow-up closely with your primary care doctor if symptoms or not improving.

## 2021-02-17 NOTE — ED Provider Notes (Signed)
[facility] Emergency Department Provider Note  ____________________________________________   Event Date/Time   First MD Initiated Contact with Patient 02/17/21 0408     (approximate)  I have reviewed the triage vital signs and the nursing notes.   HISTORY  Chief Complaint Dizziness and Nausea    HPI Laurie Ortiz is a 30 y.o. female with history of diabetes, hyperlipidemia, gastric bypass who presents to the emergency department with concerns for lightheadedness today.  States that symptoms are worse with standing and she feels like she is going to pass out.  She denies any vertigo.  She denies numbness, tingling or weakness.  No ear pain, hearing loss or tinnitus.  No chest pain or shortness of breath.  No vomiting or diarrhea.  States symptoms started yesterday morning.  States that she was found to have low blood sugars but now that they have improved she still feels lightheaded but states this has improved somewhat.        Past Medical History:  Diagnosis Date   Complication of anesthesia    slow to wake after wisdom teeth extraction   COVID-19 11/12/2018   tested (+) 11/16/18,  (-) 12/01/18   Diabetes mellitus without complication (HCC)    Family history of adverse reaction to anesthesia    Mother - slow to wake   Genital herpes 10/2018   type 2 on culture   History of chlamydia 08/2015   History of Papanicolaou smear of cervix 04/22/13; 08/30/15   NEG CT/GC NEG; NEG CT-POS/GC/TR NEG   History of vaginitis    Hypercholesteremia    Obesity (BMI 30.0-34.9)     Patient Active Problem List   Diagnosis Date Noted   CPAP (continuous positive airway pressure) dependence 04/26/2020   Delayed gastric emptying 04/26/2020   Gastritis without bleeding 04/26/2020   S/P gastric bypass 04/09/2020   Sleep apnea 03/20/2020   Hyperlipidemia 02/02/2020   Bacterial vaginosis 10/03/2019   Nausea    Hypokalemia 03/28/2019   Obesity, Class III, BMI  40-49.9 (morbid obesity) (HCC) 03/28/2019   History of 2019 novel coronavirus disease (COVID-19) 03/27/2019   Uncontrolled type 1 diabetes mellitus with hyperglycemia (HCC) 02/02/2019   Genital herpes 10/2018   DKA (diabetic ketoacidoses) 06/30/2018    Past Surgical History:  Procedure Laterality Date   GASTRIC BYPASS  04/09/2020   NASAL SEPTOPLASTY W/ TURBINOPLASTY Bilateral 03/04/2019   Procedure: NASAL SEPTOPLASTY WITH SUBMUCOUS RESECTION OF NASAL TURBINATES;  Surgeon: Linus Salmons, MD;  Location: [facility];  Service: ENT;  Laterality: Bilateral;  Diabetic - insulin   VAGINA SURGERY     age 61 or 46   WISDOM TOOTH EXTRACTION      Prior to Admission medications   Medication Sig Start Date End Date Taking? Authorizing Provider  atorvastatin (LIPITOR) 40 MG tablet Take 40 mg by mouth daily.    [provider]  b complex vitamins capsule Take by mouth. 04/11/20   [provider]  Continuous Blood Gluc Sensor (DEXCOM G6 SENSOR) MISC DISPENSE AND USE AS DIRECTED 08/01/19   [provider]  Continuous Blood Gluc Transmit (DEXCOM G6 TRANSMITTER) MISC DISPENSE AND USE AS DIRECTED 05/29/19   [provider]  ELIQUIS 2.5 MG TABS tablet Take 2.5 mg by mouth 2 (two) times daily. 03/28/20   [provider]  glucose blood (ACCU-CHEK AVIVA PLUS) test strip Use to check blood sugars up to 6 times daily 01/22/15   [provider]  insulin aspart (NOVOLOG) 100  UNIT/ML injection 10 units subcu with each meals 03/30/19 03/29/20  Delfino Lovett, MD  insulin glargine (LANTUS) 100 UNIT/ML injection 32 units subcu daily 03/30/19 03/29/20  Delfino Lovett, MD  Levonorgestrel-Ethinyl Estradiol (AMETHIA) 0.15-0.03 &0.01 MG tablet Take 1 tablet by mouth daily. 04/26/20   Tresea Mall, CNM  metFORMIN (GLUCOPHAGE) 500 MG tablet Take 500 mg by mouth 2 (two) times daily. 12/31/19   [provider]  ondansetron (ZOFRAN-ODT) 4 MG disintegrating tablet  Take 4 mg by mouth every 8 (eight) hours as needed. 03/28/20   [provider]  pantoprazole (PROTONIX) 40 MG tablet Take 40 mg by mouth daily. 03/28/20   [provider]  TOUJEO MAX SOLOSTAR 300 UNIT/ML Solostar Pen SMARTSIG:60 Unit(s) SUB-Q Every Night 08/01/19   [provider]  valACYclovir (VALTREX) 500 MG tablet Take 1 tablet (500 mg total) by mouth daily. 01/14/21   Copland, Ilona Sorrel, PA-C    Allergies Penicillins and Amoxicillin  Family History  Problem Relation Age of Onset   Hypertension Mother    Heart disease Maternal Grandmother    Breast cancer Paternal Grandmother        breast cancer in late 40s-died from breast cancer   Prostate cancer Paternal Grandfather    Dementia Paternal Grandfather    Parkinson's disease Paternal Grandfather     Social History Social History   Tobacco Use   Smoking status: Never   Smokeless tobacco: Never  Vaping Use   Vaping Use: Never used  Substance Use Topics   Alcohol use: Not Currently   Drug use: No    Review of Systems Constitutional: No fever. Eyes: No visual changes. ENT: No sore throat. Cardiovascular: Denies chest pain. Respiratory: Denies shortness of breath. Gastrointestinal: No nausea, vomiting, diarrhea. Genitourinary: Negative for dysuria. Musculoskeletal: Negative for back pain. Skin: Negative for rash. Neurological: Negative for focal weakness or numbness.  ____________________________________________   PHYSICAL EXAM:  VITAL SIGNS: ED Triage Vitals  Enc Vitals Group     BP 02/16/21 2019 (!) 147/95     Pulse Rate 02/16/21 2019 79     Resp 02/16/21 2019 18     Temp 02/16/21 2019 98.2 F (36.8 C)     Temp src --      SpO2 02/16/21 2019 100 %     Weight 02/16/21 2020 215 lb (97.5 kg)     Height 02/16/21 2020 5\' 6"  (1.676 m)     Head Circumference --      Peak Flow --      Pain Score 02/16/21 2020 0     Pain Loc --      Pain Edu? --      Excl. in GC? --     CONSTITUTIONAL: Alert and oriented and responds appropriately to questions. Well-appearing; well-nourished HEAD: Normocephalic EYES: Conjunctivae clear, pupils appear equal, EOM appear intact ENT: normal nose; moist mucous membranes NECK: Supple, normal ROM CARD: RRR; S1 and S2 appreciated; no murmurs, no clicks, no rubs, no gallops RESP: Normal chest excursion without splinting or tachypnea; breath sounds clear and equal bilaterally; no wheezes, no rhonchi, no rales, no hypoxia or respiratory distress, speaking full sentences ABD/GI: Normal bowel sounds; non-distended; soft, non-tender, no rebound, no guarding, no peritoneal signs, no hepatosplenomegaly BACK: The back appears normal EXT: Normal ROM in all joints; no deformity noted, no edema; no cyanosis SKIN: Normal color for age and race; warm; no rash on exposed skin NEURO: Moves all extremities equally, normal speech, no facial asymmetry, normal gait without  ataxia PSYCH: The patient's mood and manner are appropriate.  ____________________________________________   LABS (all labs ordered are listed, but only abnormal results are displayed)  Labs Reviewed  URINALYSIS, ROUTINE W REFLEX MICROSCOPIC - Abnormal; Notable for the following components:      Result Value   Color, Urine YELLOW (*)    APPearance HAZY (*)    Specific Gravity, Urine 1.032 (*)    Ketones, ur 5 (*)    All other components within normal limits  COMPREHENSIVE METABOLIC PANEL - Abnormal; Notable for the following components:   Potassium 3.4 (*)    Glucose, Bld 50 (*)    All other components within normal limits  CBG MONITORING, ED - Abnormal; Notable for the following components:   Glucose-Capillary 48 (*)    All other components within normal limits  CBG MONITORING, ED - Abnormal; Notable for the following components:   Glucose-Capillary 124 (*)    All other components within normal limits  CBG MONITORING, ED - Abnormal; Notable for the following  components:   Glucose-Capillary 131 (*)    All other components within normal limits  CBC  CBG MONITORING, ED  POC URINE PREG, ED   ____________________________________________  EKG   EKG Interpretation  Date/Time:  Saturday February 16 2021 20:27:20 EST Ventricular Rate:  87 PR Interval:  140 QRS Duration: 80 QT Interval:  372 QTC Calculation: 447 R Axis:   -3 Text Interpretation: Normal sinus rhythm Normal ECG Confirmed by Rochele Raring [phone]) on 02/17/2021 5:21:53 AM        ____________________________________________  RADIOLOGY Normajean Baxter Kairos Panetta, personally viewed and evaluated these images (plain radiographs) as part of my medical decision making, as well as reviewing the written report by the radiologist.  ED MD interpretation:    Official radiology report(s): No results found.  ____________________________________________   PROCEDURES  Procedure(s) performed (including Critical Care):  Procedures    ____________________________________________   INITIAL IMPRESSION / ASSESSMENT AND PLAN / ED COURSE  As part of my medical decision making, I reviewed the following data within the electronic MEDICAL RECORD NUMBER History obtained from family, Nursing notes reviewed and incorporated, Labs reviewed , EKG interpreted , Old EKG reviewed, Old chart reviewed, and Notes from prior ED visits         Patient here with hypoglycemia.  She reports no changes in medications and good oral intake.  No vomiting or diarrhea.  Blood sugars initially low but have improved and she has been able to eat and drink here.  Also complains of lightheadedness worse with standing that also is improving.  Labs show no anemia, electrolyte derangement, AKI.  Urine shows no sign of dehydration or infection.  EKG is nonischemic without arrhythmia or interval abnormality.  She denies having any vertigo.  She is neurologically intact here.  Currently hemodynamically stable.  She is able to get up  from a chair and ambulate without any difficulty.  Recommended close monitoring of her blood sugar at home as I suspect this may be why she is feeling poorly today.  I feel she is safe for discharge without further emergent work-up.  Doubt ACS, PE, dissection.  No symptoms to suggest infectious etiology.  Patient and family comfortable with this plan.   At this time, I do not feel there is any life-threatening condition present. I have reviewed, interpreted and discussed all results (EKG, imaging, lab, urine as appropriate) and exam findings with patient/family. I have reviewed nursing notes and appropriate previous records.  I  feel the patient is safe to be discharged home without further emergent workup and can continue workup as an outpatient as needed. Discussed usual and customary return precautions. Patient/family verbalize understanding and are comfortable with this plan.  Outpatient follow-up has been provided as needed. All questions have been answered.  ____________________________________________   FINAL CLINICAL IMPRESSION(S) / ED DIAGNOSES  Final diagnoses:  Hypoglycemia  Lightheadedness     ED Discharge Orders     None       *Please note:  MIRABEL AHLGREN was evaluated in Emergency Department on 02/17/2021 for the symptoms described in the history of present illness. She was evaluated in the context of the global COVID-19 pandemic, which necessitated consideration that the patient might be at risk for infection with the SARS-CoV-2 virus that causes COVID-19. Institutional protocols and algorithms that pertain to the evaluation of patients at risk for COVID-19 are in a state of rapid change based on information released by regulatory bodies including the CDC and federal and state organizations. These policies and algorithms were followed during the patient's care in the ED.  Some ED evaluations and interventions may be delayed as a result of limited staffing during and the  pandemic.*   Note:  This document was prepared using Dragon voice recognition software and may include unintentional dictation errors.    Shonique Pelphrey, Layla Maw, DO 02/17/21 [phone]

## 2021-04-04 ENCOUNTER — Ambulatory Visit (INDEPENDENT_AMBULATORY_CARE_PROVIDER_SITE_OTHER): Payer: Commercial Managed Care - PPO | Admitting: Advanced Practice Midwife

## 2021-04-04 ENCOUNTER — Other Ambulatory Visit: Payer: Self-pay

## 2021-04-04 ENCOUNTER — Other Ambulatory Visit (HOSPITAL_COMMUNITY)
Admission: RE | Admit: 2021-04-04 | Discharge: 2021-04-04 | Disposition: A | Discharge status: Home / Self Care | Payer: Commercial Managed Care - PPO | Source: Ambulatory Visit | Attending: Advanced Practice Midwife | Admitting: Advanced Practice Midwife

## 2021-04-04 ENCOUNTER — Encounter: Payer: Self-pay | Admitting: Advanced Practice Midwife

## 2021-04-04 VITALS — BP 148/84 | Ht 66.0 in | Wt 213.0 lb

## 2021-04-04 DIAGNOSIS — N898 Other specified noninflammatory disorders of vagina: Secondary | ICD-10-CM

## 2021-04-04 DIAGNOSIS — N921 Excessive and frequent menstruation with irregular cycle: Secondary | ICD-10-CM | POA: Diagnosis not present

## 2021-04-04 DIAGNOSIS — M545 Low back pain, unspecified: Secondary | ICD-10-CM | POA: Insufficient documentation

## 2021-04-04 NOTE — Progress Notes (Signed)
Patient ID: Laurie Ortiz, female   DOB: 10/26/1990, 30 y.o.   MRN: [id number]  Reason for Consult: Menstrual Problem and Back Pain   Subjective:  HPI:  Laurie Ortiz is a 30 y.o. female being seen for concern of vaginitis. She thinks she may have a yeast infection. Her symptoms began about 2 weeks ago with off and on vaginal spotting. She did start bleeding today that is likely her period. She has slight low back pain, vaginal pressure, itching and irritation. She denies discharge or odor. She did stop taking her birth control recently which may account for irregular bleeding. She also requests STD testing due to ex-partner "stepping out" of relationship with new partner. She prefers metro gel if lab comes back positive for BV.   Past Medical History:  Diagnosis Date   Complication of anesthesia    slow to wake after wisdom teeth extraction   COVID-19 11/12/2018   tested (+) 11/16/18,  (-) 12/01/18   Diabetes mellitus without complication (HCC)    Family history of adverse reaction to anesthesia    Mother - slow to wake   Genital herpes 10/2018   type 2 on culture   History of chlamydia 08/2015   History of Papanicolaou smear of cervix 04/22/13; 08/30/15   NEG CT/GC NEG; NEG CT-POS/GC/TR NEG   History of vaginitis    Hypercholesteremia    Obesity (BMI 30.0-34.9)    Family History  Problem Relation Age of Onset   Hypertension Mother    Heart disease Maternal Grandmother    Breast cancer Paternal Grandmother        breast cancer in late 40s-died from breast cancer   Prostate cancer Paternal Grandfather    Dementia Paternal Grandfather    Parkinson's disease Paternal Grandfather    Past Surgical History:  Procedure Laterality Date   GASTRIC BYPASS  04/09/2020   NASAL SEPTOPLASTY W/ TURBINOPLASTY Bilateral 03/04/2019   Procedure: NASAL SEPTOPLASTY WITH SUBMUCOUS RESECTION OF NASAL TURBINATES;  Surgeon: Linus Salmons, MD;  Location: [facility];  Service: ENT;   Laterality: Bilateral;  Diabetic - insulin   VAGINA SURGERY     age 29 or 75   WISDOM TOOTH EXTRACTION      Short Social History:  Social History   Tobacco Use   Smoking status: Never   Smokeless tobacco: Never  Substance Use Topics   Alcohol use: Not Currently    Allergies  Allergen Reactions   Penicillins Hives and Rash    Did it involve swelling of the face/tongue/throat, SOB, or low BP? No Did it involve sudden or severe rash/hives, skin peeling, or any reaction on the inside of your mouth or nose? Yes Did you need to seek medical attention at a hospital or doctor's office? No When did it last happen? 1998 If all above answers are NO, may proceed with cephalosporin use.  Doesn't take any medications ending in "cillin"   Amoxicillin Hives and Rash    (rash 09/1996)    Current Outpatient Medications  Medication Sig Dispense Refill   atorvastatin (LIPITOR) 40 MG tablet Take 40 mg by mouth daily.     b complex vitamins capsule Take by mouth.     Continuous Blood Gluc Sensor (DEXCOM G6 SENSOR) MISC DISPENSE AND USE AS DIRECTED     Continuous Blood Gluc Transmit (DEXCOM G6 TRANSMITTER) MISC DISPENSE AND USE AS DIRECTED     ELIQUIS 2.5 MG TABS tablet Take 2.5 mg by mouth 2 (two) times daily.  glucose blood (ACCU-CHEK AVIVA PLUS) test strip Use to check blood sugars up to 6 times daily     metFORMIN (GLUCOPHAGE) 500 MG tablet Take 500 mg by mouth 2 (two) times daily.     ondansetron (ZOFRAN-ODT) 4 MG disintegrating tablet Take 4 mg by mouth every 8 (eight) hours as needed.     pantoprazole (PROTONIX) 40 MG tablet Take 40 mg by mouth daily.     TOUJEO MAX SOLOSTAR 300 UNIT/ML Solostar Pen SMARTSIG:60 Unit(s) SUB-Q Every Night     valACYclovir (VALTREX) 500 MG tablet Take 1 tablet (500 mg total) by mouth daily. 90 tablet 0   insulin aspart (NOVOLOG) 100 UNIT/ML injection 10 units subcu with each meals 10 mL 3   insulin glargine (LANTUS) 100 UNIT/ML injection 32 units subcu  daily 10 mL 3   No current facility-administered medications for this visit.    Review of Systems  Constitutional:  Negative for chills and fever.  HENT:  Negative for congestion, ear discharge, ear pain, hearing loss, sinus pain and sore throat.   Eyes:  Negative for blurred vision and double vision.  Respiratory:  Negative for cough, shortness of breath and wheezing.   Cardiovascular:  Negative for chest pain, palpitations and leg swelling.  Gastrointestinal:  Negative for abdominal pain, blood in stool, constipation, diarrhea, heartburn, melena, nausea and vomiting.  Genitourinary:  Negative for dysuria, flank pain, frequency, hematuria and urgency.       Positive for vaginal pressure, itching and irritation  Musculoskeletal:  Positive for back pain. Negative for joint pain and myalgias.  Skin:  Negative for itching and rash.  Neurological:  Negative for dizziness, tingling, tremors, sensory change, speech change, focal weakness, seizures, loss of consciousness, weakness and headaches.  Endo/Heme/Allergies:  Negative for environmental allergies. Does not bruise/bleed easily.  Psychiatric/Behavioral:  Negative for depression, hallucinations, memory loss, substance abuse and suicidal ideas. The patient is not nervous/anxious and does not have insomnia.        Objective:  Objective   Vitals:   04/04/21 0852  BP: (!) 148/84  Weight: 213 lb (96.6 kg)  Height: 5\' 6"  (1.676 m)   Body mass index is 34.38 kg/m. Constitutional: Well nourished, well developed female in no acute distress.  HEENT: normal Skin: Warm and dry.  Extremity:  no edema   Respiratory: Normal respiratory effort Back: no CVAT Neuro: DTRs 2+, Cranial nerves grossly intact Psych: Alert and Oriented x3. No memory deficits. Normal mood and affect.   Pelvic exam:  is not limited by body habitus EGBUS: within normal limits Vagina: within normal limits and with normal mucosa, menstrual blood in the  vault   Assessment/Plan:     30 y.o. G1 P1 female with possible vaginitis also requesting STD testing  Aptima: vaginitis/STDs Follow up as needed after lab results Vaginitis prevention/comfort measures reviewed   [facility] 04/04/2021, 11:25 AM

## 2021-04-05 ENCOUNTER — Encounter: Payer: Self-pay | Admitting: Advanced Practice Midwife

## 2021-04-05 ENCOUNTER — Other Ambulatory Visit: Payer: Self-pay | Admitting: Advanced Practice Midwife

## 2021-04-05 DIAGNOSIS — A749 Chlamydial infection, unspecified: Secondary | ICD-10-CM

## 2021-04-05 DIAGNOSIS — B3731 Acute candidiasis of vulva and vagina: Secondary | ICD-10-CM

## 2021-04-05 LAB — CERVICOVAGINAL ANCILLARY ONLY
Bacterial Vaginitis (gardnerella): NEGATIVE
Candida Glabrata: NEGATIVE
Candida Vaginitis: POSITIVE — AB
Chlamydia: POSITIVE — AB
Comment: NEGATIVE
Comment: NEGATIVE
Comment: NEGATIVE
Comment: NEGATIVE
Comment: NEGATIVE
Comment: NORMAL
Neisseria Gonorrhea: NEGATIVE
Trichomonas: NEGATIVE

## 2021-04-05 MED ORDER — AZITHROMYCIN 500 MG PO TABS: 1000.0000 mg | ORAL_TABLET | Freq: Once | ORAL | 1 refills | Status: AC

## 2021-04-05 MED ORDER — FLUCONAZOLE 150 MG PO TABS: 150.0000 mg | ORAL_TABLET | Freq: Once | ORAL | 1 refills | Status: AC

## 2021-04-05 NOTE — Progress Notes (Signed)
Message to patient regarding yeast infection, chlamydia infection. Communicable dx reporting done, Rx's sent.

## 2021-04-06 ENCOUNTER — Encounter: Payer: Self-pay | Admitting: Advanced Practice Midwife

## 2021-04-08 ENCOUNTER — Other Ambulatory Visit: Payer: Self-pay | Admitting: Advanced Practice Midwife

## 2021-04-08 ENCOUNTER — Encounter: Payer: Self-pay | Admitting: Advanced Practice Midwife

## 2021-04-08 DIAGNOSIS — B3731 Acute candidiasis of vulva and vagina: Secondary | ICD-10-CM

## 2021-04-08 DIAGNOSIS — A749 Chlamydial infection, unspecified: Secondary | ICD-10-CM

## 2021-04-08 MED ORDER — AZITHROMYCIN 500 MG PO TABS
1000.0000 mg | ORAL_TABLET | Freq: Once | ORAL | 1 refills | Status: AC
Start: 2021-04-08 — End: 2021-04-08

## 2021-04-08 MED ORDER — FLUCONAZOLE 150 MG PO TABS: 150.0000 mg | ORAL_TABLET | Freq: Once | ORAL | 1 refills | Status: AC

## 2021-04-08 NOTE — Progress Notes (Signed)
Rx Azithromycin and Diflucan to treat Chlamydia and Yeast. Message to patient

## 2021-04-16 ENCOUNTER — Other Ambulatory Visit: Payer: Self-pay | Admitting: Obstetrics and Gynecology

## 2021-04-16 DIAGNOSIS — A6004 Herpesviral vulvovaginitis: Secondary | ICD-10-CM

## 2021-05-14 ENCOUNTER — Other Ambulatory Visit (HOSPITAL_COMMUNITY)
Admission: RE | Admit: 2021-05-14 | Discharge: 2021-05-14 | Disposition: A | Discharge status: Home / Self Care | Payer: Commercial Managed Care - PPO | Source: Ambulatory Visit | Attending: Obstetrics and Gynecology | Admitting: Obstetrics and Gynecology

## 2021-05-14 ENCOUNTER — Encounter: Payer: Self-pay | Admitting: Obstetrics and Gynecology

## 2021-05-14 ENCOUNTER — Ambulatory Visit (INDEPENDENT_AMBULATORY_CARE_PROVIDER_SITE_OTHER): Payer: Commercial Managed Care - PPO | Admitting: Obstetrics and Gynecology

## 2021-05-14 ENCOUNTER — Other Ambulatory Visit: Payer: Self-pay

## 2021-05-14 VITALS — BP 112/80 | Ht 65.0 in | Wt 212.0 lb

## 2021-05-14 DIAGNOSIS — A749 Chlamydial infection, unspecified: Secondary | ICD-10-CM | POA: Insufficient documentation

## 2021-05-14 DIAGNOSIS — N3001 Acute cystitis with hematuria: Secondary | ICD-10-CM | POA: Diagnosis not present

## 2021-05-14 DIAGNOSIS — R81 Glycosuria: Secondary | ICD-10-CM

## 2021-05-14 DIAGNOSIS — A6004 Herpesviral vulvovaginitis: Secondary | ICD-10-CM

## 2021-05-14 DIAGNOSIS — Z113 Encounter for screening for infections with a predominantly sexual mode of transmission: Secondary | ICD-10-CM | POA: Diagnosis present

## 2021-05-14 LAB — POCT URINALYSIS DIPSTICK
Bilirubin, UA: NEGATIVE
Glucose, UA: POSITIVE — AB
Ketones, UA: NEGATIVE
Protein, UA: POSITIVE — AB
Spec Grav, UA: 1.02 (ref 1.010–1.025)
pH, UA: 5 (ref 5.0–8.0)

## 2021-05-14 LAB — GLUCOSE, POCT (MANUAL RESULT ENTRY): POC Glucose: 353 mg/dl — AB (ref 70–99)

## 2021-05-14 MED ORDER — VALACYCLOVIR HCL 500 MG PO TABS: 500.0000 mg | ORAL_TABLET | Freq: Every day | ORAL | 0 refills | Status: DC

## 2021-05-14 NOTE — Progress Notes (Signed)
[facility], [place]   Chief Complaint  Patient presents with   Urinary Tract Infection    Frequency and burning urinating, back pain, blood when wipes since yesterday    HPI:      Laurie Ortiz is a 31 y.o. G1P1001 whose LMP was Patient's last menstrual period was 05/06/2021 (approximate)., presents today for UTI sx of urinary frequency/urgency/dysuria since yesterday. Also with blood with wiping, unsure if vaginal. Has LBP, no pelvic pain, fevers. No vag sx. Hx of UTI sx in past with neg C&S at times.  Did yeast and chlamydia tx 12/22 per culture results. Partner did tx. Due for TOC today. No vag sx.  Pt needs Rx RF on valtrex until can sheds annual.  Hx of type 1 DM on insulin; hasn't eaten today or taken medicine.   Patient Active Problem List   Diagnosis Date Noted   CPAP (continuous positive airway pressure) dependence 04/26/2020   Delayed gastric emptying 04/26/2020   Gastritis without bleeding 04/26/2020   S/P gastric bypass 04/09/2020   Sleep apnea 03/20/2020   Hyperlipidemia 02/02/2020   Bacterial vaginosis 10/03/2019   Nausea    Hypokalemia 03/28/2019   Obesity, Class III, BMI 40-49.9 (morbid obesity) ([place]) 03/28/2019   History of 2019 novel coronavirus disease (COVID-19) 03/27/2019   Uncontrolled type 1 diabetes mellitus with hyperglycemia ([place]) 02/02/2019   Genital herpes 10/2018   DKA (diabetic ketoacidoses) 06/30/2018   Chlamydia 09/18/2016    Past Surgical History:  Procedure Laterality Date   GASTRIC BYPASS  04/09/2020   NASAL SEPTOPLASTY W/ TURBINOPLASTY Bilateral 03/04/2019   Procedure: NASAL SEPTOPLASTY WITH SUBMUCOUS RESECTION OF NASAL TURBINATES;  Surgeon: Beverly Gust, MD;  Location: [facility];  Service: ENT;  Laterality: Bilateral;  Diabetic - insulin   VAGINA SURGERY     age 60 or 49   WISDOM TOOTH EXTRACTION      Family History  Problem Relation Age of Onset   Hypertension Mother    Heart  disease Maternal Grandmother    Breast cancer Paternal Grandmother        breast cancer in late 40s-died from breast cancer   Prostate cancer Paternal Grandfather    Dementia Paternal Grandfather    Parkinson's disease Paternal Grandfather     Social History   Socioeconomic History   Marital status: Single    Spouse name: Not on file   Number of children: 1   Years of education: 12   Highest education level: Not on file  Occupational History   Occupation: Best boy: [employer]  Tobacco Use   Smoking status: Never   Smokeless tobacco: Never  Vaping Use   Vaping Use: Never used  Substance and Sexual Activity   Alcohol use: Not Currently   Drug use: No   Sexual activity: Yes    Partners: Male    Birth control/protection: None  Other Topics Concern   Not on file  Social History Narrative   Not on file   Social Determinants of Health   Financial Resource Strain: Not on file  Food Insecurity: Not on file  Transportation Needs: Not on file  Physical Activity: Not on file  Stress: Not on file  Social Connections: Not on file  Intimate Partner Violence: Not on file    Outpatient Medications Prior to Visit  Medication Sig Dispense Refill   atorvastatin (LIPITOR) 40 MG tablet Take 40 mg by mouth daily.     [place]  300 UNIT/ML Solostar Pen SMARTSIG:60 Unit(s) SUB-Q Every Night     valACYclovir (VALTREX) 500 MG tablet Take 1 tablet (500 mg total) by mouth daily. 90 tablet 0   insulin aspart (NOVOLOG) 100 UNIT/ML injection 10 units subcu with each meals 10 mL 3   b complex vitamins capsule Take by mouth.     Continuous Blood Gluc Sensor (DEXCOM G6 SENSOR) MISC DISPENSE AND USE AS DIRECTED     Continuous Blood Gluc Transmit (DEXCOM G6 TRANSMITTER) MISC DISPENSE AND USE AS DIRECTED     ELIQUIS 2.5 MG TABS tablet Take 2.5 mg by mouth 2 (two) times daily.     glucose blood (ACCU-CHEK AVIVA PLUS) test strip Use to check blood sugars up to 6 times daily      insulin glargine (LANTUS) 100 UNIT/ML injection 32 units subcu daily 10 mL 3   metFORMIN (GLUCOPHAGE) 500 MG tablet Take 500 mg by mouth 2 (two) times daily.     ondansetron (ZOFRAN-ODT) 4 MG disintegrating tablet Take 4 mg by mouth every 8 (eight) hours as needed.     pantoprazole (PROTONIX) 40 MG tablet Take 40 mg by mouth daily.     No facility-administered medications prior to visit.      ROS:  Review of Systems  Constitutional:  Negative for fever.  Gastrointestinal:  Negative for blood in stool, constipation, diarrhea, nausea and vomiting.  Genitourinary:  Positive for dysuria, frequency, hematuria and urgency. Negative for dyspareunia, flank pain, vaginal bleeding, vaginal discharge and vaginal pain.  Musculoskeletal:  Positive for back pain.  Skin:  Negative for rash.  BREAST: No symptoms   OBJECTIVE:   Vitals:  BP 112/80    Ht 5\' 5"  (1.651 m)    Wt 212 lb (96.2 kg)    LMP 05/06/2021 (Approximate)    BMI 35.28 kg/m   Physical Exam Vitals reviewed.  Constitutional:      Appearance: She is well-developed. She is not ill-appearing or toxic-appearing.  Pulmonary:     Effort: Pulmonary effort is normal.  Abdominal:     Tenderness: There is no right CVA tenderness or left CVA tenderness.  Genitourinary:    General: Normal vulva.     Pubic Area: No rash.      Labia:        Right: No rash, tenderness or lesion.        Left: No rash, tenderness or lesion.      Vagina: Normal. No vaginal discharge, erythema, tenderness or bleeding.     Cervix: Normal.     Uterus: Normal. Not enlarged and not tender.      Adnexa: Right adnexa normal and left adnexa normal.       Right: No mass or tenderness.         Left: No mass or tenderness.    Musculoskeletal:        General: Normal range of motion.     Cervical back: Normal range of motion.  Skin:    General: Skin is warm and dry.  Neurological:     General: No focal deficit present.     Mental Status: She is alert and oriented  to person, place, and time.     Cranial Nerves: No cranial nerve deficit.  Psychiatric:        Mood and Affect: Mood normal.        Behavior: Behavior normal.        Thought Content: Thought content normal.        Judgment:  Judgment normal.    Results: Results for orders placed or performed in visit on 05/14/21 (from the past 24 hour(s))  POCT Urinalysis Dipstick     Status: Abnormal   Collection Time: 05/14/21  4:30 PM  Result Value Ref Range   Color, UA yellow    Clarity, UA clear    Glucose, UA Positive (A) Negative   Bilirubin, UA neg    Ketones, UA neg    Spec Grav, UA 1.020 1.010 - 1.025   Blood, UA large    pH, UA 5.0 5.0 - 8.0   Protein, UA Positive (A) Negative   Urobilinogen, UA     Nitrite, UA trace    Leukocytes, UA Small (1+) (A) Negative   Appearance     Odor    POCT Glucose (CBG)     Status: Abnormal   Collection Time: 05/14/21  4:34 PM  Result Value Ref Range   POC Glucose 353 (A) 70 - 99 mg/dl     Assessment/Plan: Acute cystitis with hematuria - Plan: POCT Urinalysis Dipstick, Urine Culture; pos sx and UA. Rx macrobid, check C&S. F/u prn.   Chlamydia - Plan: Cervicovaginal ancillary only; TOC today. Will f/u if abn.   Screening for STD (sexually transmitted disease) - Plan: Cervicovaginal ancillary only  Glucosuria - Plan: POCT Glucose (CBG); hx of DM. Hasn't eaten today, not used her insulin. Pt to go home and eat/use meds. F/u with PCP prn.   Herpes simplex vulvovaginitis - Plan: valACYclovir (VALTREX) 500 MG tablet; Rx RF. Pt due for annual.    Meds ordered this encounter  Medications   valACYclovir (VALTREX) 500 MG tablet    Sig: Take 1 tablet (500 mg total) by mouth daily.    Dispense:  90 tablet    Refill:  0    ANNUAL DUE 1/23    Order Specific Question:   Supervising Provider    Answer:   Gae Dry [id number]      Return in about 2 months (around 123456) for annual.  Laurie Deniston B. Aury Scollard, PA-C 05/14/2021 4:34 PM

## 2021-05-15 ENCOUNTER — Other Ambulatory Visit: Payer: Self-pay | Admitting: Obstetrics and Gynecology

## 2021-05-15 DIAGNOSIS — N3001 Acute cystitis with hematuria: Secondary | ICD-10-CM

## 2021-05-15 MED ORDER — NITROFURANTOIN MONOHYD MACRO 100 MG PO CAPS: 100.0000 mg | ORAL_CAPSULE | Freq: Two times a day (BID) | ORAL | 0 refills | Status: AC

## 2021-05-16 LAB — CERVICOVAGINAL ANCILLARY ONLY
Chlamydia: NEGATIVE
Comment: NEGATIVE
Comment: NORMAL
Neisseria Gonorrhea: NEGATIVE

## 2021-05-18 LAB — URINE CULTURE

## 2021-05-19 ENCOUNTER — Encounter: Payer: Self-pay | Admitting: Obstetrics and Gynecology

## 2021-05-20 MED ORDER — SULFAMETHOXAZOLE-TRIMETHOPRIM 800-160 MG PO TABS: 1.0000 | ORAL_TABLET | Freq: Two times a day (BID) | ORAL | 0 refills | Status: AC

## 2021-06-14 ENCOUNTER — Other Ambulatory Visit: Payer: Self-pay | Admitting: Advanced Practice Midwife

## 2021-06-14 DIAGNOSIS — Z3041 Encounter for surveillance of contraceptive pills: Secondary | ICD-10-CM

## 2021-06-27 ENCOUNTER — Encounter: Payer: Self-pay | Admitting: Obstetrics and Gynecology

## 2021-06-27 MED ORDER — SULFAMETHOXAZOLE-TRIMETHOPRIM 800-160 MG PO TABS: 1.0000 | ORAL_TABLET | Freq: Two times a day (BID) | ORAL | 0 refills | Status: AC

## 2021-09-09 ENCOUNTER — Encounter: Payer: Self-pay | Admitting: Obstetrics and Gynecology

## 2021-09-09 DIAGNOSIS — B3731 Acute candidiasis of vulva and vagina: Secondary | ICD-10-CM

## 2021-09-10 MED ORDER — FLUCONAZOLE 150 MG PO TABS: 150.0000 mg | ORAL_TABLET | Freq: Once | ORAL | 0 refills | Status: AC

## 2021-10-09 ENCOUNTER — Other Ambulatory Visit: Payer: Self-pay | Admitting: Advanced Practice Midwife

## 2021-10-09 DIAGNOSIS — N898 Other specified noninflammatory disorders of vagina: Secondary | ICD-10-CM

## 2021-10-14 ENCOUNTER — Encounter: Payer: Self-pay | Admitting: Obstetrics and Gynecology

## 2021-10-14 ENCOUNTER — Ambulatory Visit (INDEPENDENT_AMBULATORY_CARE_PROVIDER_SITE_OTHER): Payer: Commercial Managed Care - PPO | Admitting: Obstetrics and Gynecology

## 2021-10-14 VITALS — BP 126/80 | Ht 66.0 in | Wt 221.0 lb

## 2021-10-14 DIAGNOSIS — N898 Other specified noninflammatory disorders of vagina: Secondary | ICD-10-CM | POA: Diagnosis not present

## 2021-10-14 DIAGNOSIS — A6004 Herpesviral vulvovaginitis: Secondary | ICD-10-CM | POA: Diagnosis not present

## 2021-10-14 LAB — POCT WET PREP WITH KOH
Clue Cells Wet Prep HPF POC: NEGATIVE
KOH Prep POC: NEGATIVE
Trichomonas, UA: NEGATIVE
Yeast Wet Prep HPF POC: NEGATIVE

## 2021-10-14 MED ORDER — FLUCONAZOLE 150 MG PO TABS: 150.0000 mg | ORAL_TABLET | Freq: Once | ORAL | 0 refills | Status: AC

## 2021-10-14 MED ORDER — VALACYCLOVIR HCL 500 MG PO TABS: 500.0000 mg | ORAL_TABLET | Freq: Every day | ORAL | 2 refills | Status: DC

## 2021-10-14 MED ORDER — VALACYCLOVIR HCL 500 MG PO TABS: 500.0000 mg | ORAL_TABLET | Freq: Every day | ORAL | 0 refills | Status: DC

## 2021-10-14 NOTE — Progress Notes (Signed)
[facility]   Chief Complaint  Patient presents with   Vaginal Itching    Irritation, no discharge or odor x 3 days    HPI:      [name] is a 31 y.o. G1P1001 whose LMP was Patient's last menstrual period was 09/28/2021 (exact date)., presents today for vaginal itching/irritation for 3 days, no increased d/c or odor. Pt was sick last wk and blood sugars were elevated. She started treating with monistat-7 3 days ago but not getting better. No new soaps/detergents, no new sexual partners, no urin sx.  Needs Rx RF valtrex, takes daily.  Needs to schedule annual/pap due.   Patient Active Problem List   Diagnosis Date Noted   CPAP (continuous positive airway pressure) dependence 04/26/2020   Delayed gastric emptying 04/26/2020   Gastritis without bleeding 04/26/2020   S/P gastric bypass 04/09/2020   Sleep apnea 03/20/2020   Hyperlipidemia 02/02/2020   Bacterial vaginosis 10/03/2019   Nausea    Hypokalemia 03/28/2019   Obesity, Class III, BMI 40-49.9 (morbid obesity) ([facility]) 03/28/2019   History of 2019 novel coronavirus disease (COVID-19) 03/27/2019   Uncontrolled type 1 diabetes mellitus with hyperglycemia ([facility]) 02/02/2019   Genital herpes 10/2018   DKA (diabetic ketoacidoses) 06/30/2018   Chlamydia 09/18/2016    Past Surgical History:  Procedure Laterality Date   GASTRIC BYPASS  04/09/2020   NASAL SEPTOPLASTY W/ TURBINOPLASTY Bilateral 03/04/2019   Procedure: NASAL SEPTOPLASTY WITH SUBMUCOUS RESECTION OF NASAL TURBINATES;  Surgeon: Linus Salmons, MD;  Location: [facility];  Service: ENT;  Laterality: Bilateral;  Diabetic - insulin   VAGINA SURGERY     age 78 or 78   WISDOM TOOTH EXTRACTION      Family History  Problem Relation Age of Onset   Hypertension Mother    Heart disease Maternal Grandmother    Breast cancer Paternal Grandmother        breast cancer in late 40s-died from breast cancer   Prostate  cancer Paternal Grandfather    Dementia Paternal Grandfather    Parkinson's disease Paternal Grandfather     Social History   Socioeconomic History   Marital status: Single    Spouse name: Not on file   Number of children: 1   Years of education: 12   Highest education level: Not on file  Occupational History   Occupation: Event organiser: [employer]  Tobacco Use   Smoking status: Never   Smokeless tobacco: Never  Vaping Use   Vaping Use: Never used  Substance and Sexual Activity   Alcohol use: Not Currently   Drug use: No   Sexual activity: Yes    Partners: Male    Birth control/protection: None  Other Topics Concern   Not on file  Social History Narrative   Not on file   Social Determinants of Health   Financial Resource Strain: Not on file  Food Insecurity: Not on file  Transportation Needs: Not on file  Physical Activity: Not on file  Stress: Not on file  Social Connections: Not on file  Intimate Partner Violence: Not on file    Outpatient Medications Prior to Visit  Medication Sig Dispense Refill   atorvastatin (LIPITOR) 40 MG tablet Take 40 mg by mouth daily.     Continuous Blood Gluc Sensor (FREESTYLE LIBRE 2 SENSOR) MISC by Does not apply route.     HUMALOG KWIKPEN 100 UNIT/ML KwikPen Inject into the skin.  TOUJEO MAX SOLOSTAR 300 UNIT/ML Solostar Pen SMARTSIG:60 Unit(s) SUB-Q Every Night     valACYclovir (VALTREX) 500 MG tablet Take 1 tablet (500 mg total) by mouth daily. 90 tablet 0   insulin aspart (NOVOLOG) 100 UNIT/ML injection 10 units subcu with each meals 10 mL 3   No facility-administered medications prior to visit.      ROS:  Review of Systems  Constitutional:  Negative for fever.  Gastrointestinal:  Negative for blood in stool, constipation, diarrhea, nausea and vomiting.  Genitourinary:  Negative for dyspareunia, dysuria, flank pain, frequency, hematuria, urgency, vaginal bleeding, vaginal discharge and vaginal pain.   Musculoskeletal:  Negative for back pain.  Skin:  Negative for rash.   BREAST: No symptoms   OBJECTIVE:   Vitals:  BP 126/80   Ht 5\' 6"  (1.676 m)   Wt 221 lb (100.2 kg)   LMP 09/28/2021 (Exact Date)   BMI 35.67 kg/m   Physical Exam Vitals reviewed.  Constitutional:      Appearance: She is well-developed.  Pulmonary:     Effort: Pulmonary effort is normal.  Genitourinary:    General: Normal vulva.     Pubic Area: No rash.      Labia:        Right: No rash, tenderness or lesion.        Left: No rash, tenderness or lesion.      Vagina: Vaginal discharge present. No erythema or tenderness.     Cervix: Normal.     Uterus: Normal. Not enlarged and not tender.      Adnexa: Right adnexa normal and left adnexa normal.       Right: No mass or tenderness.         Left: No mass or tenderness.    Musculoskeletal:        General: Normal range of motion.     Cervical back: Normal range of motion.  Skin:    General: Skin is warm and dry.  Neurological:     General: No focal deficit present.     Mental Status: She is alert and oriented to person, place, and time.  Psychiatric:        Mood and Affect: Mood normal.        Behavior: Behavior normal.        Thought Content: Thought content normal.        Judgment: Judgment normal.     Results: Results for orders placed or performed in visit on 10/14/21 (from the past 24 hour(s))  POCT Wet Prep with KOH     Status: Normal   Collection Time: 10/14/21 11:22 AM  Result Value Ref Range   Trichomonas, UA Negative    Clue Cells Wet Prep HPF POC neg    Epithelial Wet Prep HPF POC     Yeast Wet Prep HPF POC neg    Bacteria Wet Prep HPF POC     RBC Wet Prep HPF POC     WBC Wet Prep HPF POC     KOH Prep POC Negative Negative     Assessment/Plan: Vaginal itching - Plan: fluconazole (DIFLUCAN) 150 MG tablet, POCT Wet Prep with KOH; pos sx and neg wet prep, already treating with monistat. Rx diflucan, complete monistat. F/u prn.    Herpes simplex vulvovaginitis - Plan: valACYclovir (VALTREX) 500 MG tablet, Rx RF, pt needs to schedule annual   Meds ordered this encounter  Medications   fluconazole (DIFLUCAN) 150 MG tablet    Sig: Take 1  tablet (150 mg total) by mouth once for 1 dose.    Dispense:  1 tablet    Refill:  0    Order Specific Question:   Supervising Provider    Answer:   Hildred Laser [[id number]]   DISCONTD: valACYclovir (VALTREX) 500 MG tablet    Sig: Take 1 tablet (500 mg total) by mouth daily.    Dispense:  90 tablet    Refill:  2    Order Specific Question:   Supervising Provider    Answer:   Hildred Laser [[id number]]   valACYclovir (VALTREX) 500 MG tablet    Sig: Take 1 tablet (500 mg total) by mouth daily.    Dispense:  90 tablet    Refill:  0    Use this Rx with 0 RF, not the one with 2 RF. Pls cancel Rx with 2 RF.    Order Specific Question:   Supervising Provider    Answer:   Hildred Laser [[id number]]      Return in about 2 months (around 12/15/2021) for annual.  Odean Mcelwain B. Cellie Dardis, PA-C 10/14/2021 11:24 AM

## 2021-10-16 ENCOUNTER — Telehealth: Payer: Self-pay | Admitting: Obstetrics & Gynecology

## 2021-10-16 NOTE — Telephone Encounter (Signed)
PT is scheduled with Dr.Dove on July 11 at 8:55 for placement for Mirena.

## 2021-10-16 NOTE — Telephone Encounter (Signed)
Noted. Will order to arrive by appointment date/time. 

## 2021-10-22 ENCOUNTER — Ambulatory Visit (INDEPENDENT_AMBULATORY_CARE_PROVIDER_SITE_OTHER): Payer: Commercial Managed Care - PPO | Admitting: Obstetrics & Gynecology

## 2021-10-22 ENCOUNTER — Encounter: Payer: Self-pay | Admitting: Obstetrics & Gynecology

## 2021-10-22 VITALS — BP 122/74 | Ht 65.0 in | Wt 220.0 lb

## 2021-10-22 DIAGNOSIS — Z3202 Encounter for pregnancy test, result negative: Secondary | ICD-10-CM

## 2021-10-22 DIAGNOSIS — Z30430 Encounter for insertion of intrauterine contraceptive device: Secondary | ICD-10-CM

## 2021-10-22 DIAGNOSIS — Z975 Presence of (intrauterine) contraceptive device: Secondary | ICD-10-CM

## 2021-10-22 LAB — POCT URINE PREGNANCY: Preg Test, Ur: NEGATIVE

## 2021-10-22 MED ORDER — LEVONORGESTREL 20 MCG/DAY IU IUD
1.0000 | INTRAUTERINE_SYSTEM | Freq: Once | INTRAUTERINE | Status: AC
  Administered 2021-10-22: 1 via INTRAUTERINE

## 2021-10-22 NOTE — Progress Notes (Signed)
   Subjective:    Patient ID: Laurie Ortiz, female    DOB: 1990-12-15, 31 y.o.   MRN: [id number]  HPI 31 yo married P73 (96 yo son) here for insertion of her second Mirena. She had her Mirena removed about 3 years ago because she wanted a pregnancy, but she now feels that she doesn't want a pregnancy now. She wants to return to [place]. In the past with Mirena she didn't have periods after the first 3 months of use. She has not had any sex since her most recent period last week.   Review of Systems    Last pap normal 2020 Objective:   Physical Exam Well nourished, well hydrated White female, no apparent distress She is ambulating and conversing normally. UPT negative, consent signed, Time out procedure done. Bimanual exam revealed a uterus NSSA, no adnexal masses or tenderness Cervix prepped with betadine and Hurricaine spray and then grasped with a single tooth tenaculum. Mirena was easily placed and the strings were cut to 3-4 cm. Uterus sounded to 8 cm. She tolerated the procedure well.      Assessment & Plan:  Contraception- Mirena Rec back up method for 2 weeks Come back for annual and string check in 4 weeks.

## 2021-10-24 ENCOUNTER — Encounter: Payer: Self-pay | Admitting: Obstetrics

## 2021-10-24 ENCOUNTER — Ambulatory Visit (INDEPENDENT_AMBULATORY_CARE_PROVIDER_SITE_OTHER): Payer: Commercial Managed Care - PPO | Admitting: Obstetrics

## 2021-10-24 VITALS — BP 126/84 | Wt 212.0 lb

## 2021-10-24 DIAGNOSIS — N61 Mastitis without abscess: Secondary | ICD-10-CM

## 2021-10-24 MED ORDER — CLINDAMYCIN HCL 300 MG PO CAPS: 300.0000 mg | ORAL_CAPSULE | Freq: Two times a day (BID) | ORAL | 0 refills | Status: DC

## 2021-10-24 MED ORDER — CLINDAMYCIN HCL 300 MG PO CAPS: 300.0000 mg | ORAL_CAPSULE | Freq: Three times a day (TID) | ORAL | 0 refills | Status: AC

## 2021-10-24 NOTE — Progress Notes (Signed)
Obstetrics & Gynecology Office Visit   Chief Complaint:  Chief Complaint  Patient presents with   Breast Problem    History of Present Illness: Laurie Ortiz , who prefers Laurie Ortiz, presents today with a breast concern. She developed right breast pain overnight, and now reports that the breast hurts when touched or palpated, and is hot to the touch. She is not nursing, is not on any new medications. She is a 31 yo mother of an 14 year old. Her nipples are pierced x two years. She has no hx of mastitis;  did breastfeed her son for 7 months. No recent breast trauma. She has taken some Tylenol for the pain.There is no hx of breast cancer in her immediate family.   Review of Systems:  Review of Systems  Constitutional:        She has felt "hot to the touch"  HENT: Negative.    Eyes: Negative.   Respiratory: Negative.    Cardiovascular: Negative.   Gastrointestinal: Negative.   Genitourinary: Negative.   Musculoskeletal: Negative.   Skin:        Describes redness seen on the outer aspects of the right breast  Neurological: Negative.   Endo/Heme/Allergies: Negative.   Psychiatric/Behavioral: Negative.       Past Medical History:  Past Medical History:  Diagnosis Date   Complication of anesthesia    slow to wake after wisdom teeth extraction   COVID-19 11/12/2018   tested (+) 11/16/18,  (-) 12/01/18   Diabetes mellitus without complication ([facility])    Family history of adverse reaction to anesthesia    Mother - slow to wake   Genital herpes 10/2018   type 2 on culture   History of chlamydia 08/2015   History of Papanicolaou smear of cervix 04/22/13; 08/30/15   NEG CT/GC NEG; NEG CT-POS/GC/TR NEG   History of vaginitis    Hypercholesteremia    Obesity (BMI 30.0-34.9)     Past Surgical History:  Past Surgical History:  Procedure Laterality Date   GASTRIC BYPASS  04/09/2020   NASAL SEPTOPLASTY W/ TURBINOPLASTY Bilateral 03/04/2019   Procedure: NASAL SEPTOPLASTY WITH SUBMUCOUS  RESECTION OF NASAL TURBINATES;  Surgeon: Linus Salmons, MD;  Location: [facility];  Service: ENT;  Laterality: Bilateral;  Diabetic - insulin   VAGINA SURGERY     age 74 or 22   WISDOM TOOTH EXTRACTION      Gynecologic History: Patient's last menstrual period was 10/18/2021 (exact date).  Obstetric History: G1P1001  Family History:  Family History  Problem Relation Age of Onset   Hypertension Mother    Heart disease Maternal Grandmother    Breast cancer Paternal Grandmother        breast cancer in late 40s-died from breast cancer   Prostate cancer Paternal Grandfather    Dementia Paternal Grandfather    Parkinson's disease Paternal Grandfather     Social History:  Social History   Socioeconomic History   Marital status: Single    Spouse name: Not on file   Number of children: 1   Years of education: 12   Highest education level: Not on file  Occupational History   Occupation: Event organiser: [employer]  Tobacco Use   Smoking status: Never   Smokeless tobacco: Never  Vaping Use   Vaping Use: Never used  Substance and Sexual Activity   Alcohol use: Not Currently   Drug use: No   Sexual activity: Yes    Partners: Male  Birth control/protection: None  Other Topics Concern   Not on file  Social History Narrative   Not on file   Social Determinants of Health   Financial Resource Strain: Not on file  Food Insecurity: Not on file  Transportation Needs: Not on file  Physical Activity: Not on file  Stress: Not on file  Social Connections: Not on file  Intimate Partner Violence: Not on file    Allergies:  Allergies  Allergen Reactions   Penicillins Hives and Rash    Did it involve swelling of the face/tongue/throat, SOB, or low BP? No Did it involve sudden or severe rash/hives, skin peeling, or any reaction on the inside of your mouth or nose? Yes Did you need to seek medical attention at a hospital or doctor's office? No When did it last  happen? 1998 If all above answers are "NO", may proceed with cephalosporin use.  Doesn't take any medications ending in "cillin"   Amoxicillin Hives and Rash    (rash 09/1996)    Medications: Prior to Admission medications   Medication Sig Start Date End Date Taking? Authorizing Provider  clindamycin (CLEOCIN) 300 MG capsule Take 1 capsule (300 mg total) by mouth 2 (two) times daily for 7 days. 10/24/21 10/31/21 Yes Mirna Mires, CNM  atorvastatin (LIPITOR) 40 MG tablet Take 40 mg by mouth daily.    [provider]  Continuous Blood Gluc Sensor (FREESTYLE LIBRE 2 SENSOR) MISC by Does not apply route. 07/15/21   [provider]  HUMALOG KWIKPEN 100 UNIT/ML KwikPen Inject into the skin. 09/19/21   [provider]  insulin aspart (NOVOLOG) 100 UNIT/ML injection 10 units subcu with each meals 03/30/19 03/29/20  Delfino Lovett, MD  levonorgestrel (MIRENA) 20 MCG/DAY IUD 1 each by Intrauterine route once. 10/22/21   [provider]  TOUJEO MAX SOLOSTAR 300 UNIT/ML Solostar Pen SMARTSIG:60 Unit(s) SUB-Q Every Night 08/01/19   [provider]  valACYclovir (VALTREX) 500 MG tablet Take 1 tablet (500 mg total) by mouth daily. 10/14/21   Copland, Ilona Sorrel, PA-C    Physical Exam Vitals:  Vitals:   10/24/21 1601  BP: 126/84   Patient's last menstrual period was 10/18/2021 (exact date).  Physical Exam Vitals reviewed.  Constitutional:      Appearance: Normal appearance. She is obese.  HENT:     Head: Normocephalic and atraumatic.  Cardiovascular:     Rate and Rhythm: Normal rate and regular rhythm.  Pulmonary:     Effort: Pulmonary effort is normal.     Breath sounds: Normal breath sounds.  Chest:       Comments: Right breast has several reddened areas that are sensitive to touch. See diagram. Skin:    General: Skin is warm and dry.     Comments: Right breast: slightly pendulous, with pierced nipple Skin surface has several areas of reddened  tissue; moderately tender to touch No masses palpated, no engorgement or nipple discharge Right breast - normal exam, no unusual findings- also pierced  Neurological:     General: No focal deficit present.     Mental Status: She is alert and oriented to person, place, and time.  Psychiatric:        Mood and Affect: Mood normal.        Behavior: Behavior normal.      Assessment: 31 y.o. G1P1001  with suspected breast cellulitis   Plan: Problem List Items Addressed This Visit   None Visit Diagnoses     Cellulitis of  breast    -  Primary   Relevant Medications   clindamycin (CLEOCIN) 300 MG capsule      Discussed this case with Dr. Dalbert Garnet who is providing CNM backup today. Will start patient on Clindamycin , as she is allergic to PCN and other "cillins".  300 mg po TID for 7-10 days. This is discussed with the patient, who is advised to make an appt for follow up in 2 weeks. Should she not improve, would refer to a breast surgeon.  We did discuss the removal of her nipple piercings, which I do nt feel she must do at this point.  Time spent providing direct clinical care, in consultation, and in ordering meds and researching treatment is 35 minutes.  Mirna Mires, CNM  10/24/2021 5:15 PM

## 2021-11-19 ENCOUNTER — Ambulatory Visit: Payer: Commercial Managed Care - PPO | Admitting: Obstetrics & Gynecology

## 2021-11-22 ENCOUNTER — Ambulatory Visit: Payer: Commercial Managed Care - PPO | Admitting: Obstetrics

## 2021-12-05 ENCOUNTER — Ambulatory Visit: Payer: Commercial Managed Care - PPO | Admitting: Obstetrics

## 2022-04-18 ENCOUNTER — Other Ambulatory Visit (HOSPITAL_COMMUNITY)
Admission: RE | Admit: 2022-04-18 | Discharge: 2022-04-18 | Disposition: A | Discharge status: Home / Self Care | Payer: Medicaid Other | Source: Ambulatory Visit | Attending: Obstetrics | Admitting: Obstetrics

## 2022-04-18 ENCOUNTER — Ambulatory Visit: Payer: Medicaid Other

## 2022-04-18 DIAGNOSIS — N898 Other specified noninflammatory disorders of vagina: Secondary | ICD-10-CM

## 2022-04-18 MED ORDER — METRONIDAZOLE 500 MG PO TABS: 500.0000 mg | ORAL_TABLET | Freq: Two times a day (BID) | ORAL | 0 refills | Status: DC

## 2022-04-18 NOTE — Progress Notes (Signed)
SUBJECTIVE:  32 y.o. female complains of bloody and milky vaginal discharge for 7 days . Denies abnormal vaginal bleeding or significant pelvic pain or fever. No UTI symptoms. Denies history of known exposure to STD.   OBJECTIVE:  She appears well, afebrile.   ASSESSMENT:  bacterial vaginosis  PLAN:  GC and chlamydia DNA  probe sent to lab. Treatment: Flagyl 500 BID x 7 days and abstain from coitus during course of treatment ROV prn if symptoms persist or worsen. Will let her know what swab says and we will change treatment if it is anything other than BV.

## 2022-04-22 ENCOUNTER — Encounter: Payer: Self-pay | Admitting: Obstetrics

## 2022-04-22 LAB — CERVICOVAGINAL ANCILLARY ONLY
Bacterial Vaginitis (gardnerella): POSITIVE — AB
Candida Glabrata: POSITIVE — AB
Candida Vaginitis: NEGATIVE
Chlamydia: NEGATIVE
Comment: NEGATIVE
Comment: NEGATIVE
Comment: NEGATIVE
Comment: NEGATIVE
Comment: NEGATIVE
Comment: NORMAL
Neisseria Gonorrhea: NEGATIVE
Trichomonas: NEGATIVE

## 2022-05-06 ENCOUNTER — Emergency Department
Admission: EM | Admit: 2022-05-06 | Discharge: 2022-05-06 | Disposition: A | Discharge status: Home / Self Care | Payer: Medicaid Other | Attending: Emergency Medicine | Admitting: Emergency Medicine

## 2022-05-06 ENCOUNTER — Encounter: Payer: Self-pay | Admitting: Emergency Medicine

## 2022-05-06 ENCOUNTER — Emergency Department: Payer: Medicaid Other

## 2022-05-06 DIAGNOSIS — I471 Supraventricular tachycardia, unspecified: Secondary | ICD-10-CM | POA: Insufficient documentation

## 2022-05-06 DIAGNOSIS — E119 Type 2 diabetes mellitus without complications: Secondary | ICD-10-CM | POA: Insufficient documentation

## 2022-05-06 LAB — CBC
HCT: 38.1 % (ref 36.0–46.0)
Hemoglobin: 12.4 g/dL (ref 12.0–15.0)
MCH: 31.2 pg (ref 26.0–34.0)
MCHC: 32.5 g/dL (ref 30.0–36.0)
MCV: 96 fL (ref 80.0–100.0)
Platelets: 287 10*3/uL (ref 150–400)
RBC: 3.97 MIL/uL (ref 3.87–5.11)
RDW: 13.1 % (ref 11.5–15.5)
WBC: 4.6 10*3/uL (ref 4.0–10.5)
nRBC: 0 % (ref 0.0–0.2)

## 2022-05-06 LAB — BASIC METABOLIC PANEL
Anion gap: 14 (ref 5–15)
BUN: 11 mg/dL (ref 6–20)
CO2: 20 mmol/L — ABNORMAL LOW (ref 22–32)
Calcium: 8 mg/dL — ABNORMAL LOW (ref 8.9–10.3)
Chloride: 103 mmol/L (ref 98–111)
Creatinine, Ser: 0.62 mg/dL (ref 0.44–1.00)
GFR, Estimated: >60 mL/min (ref >60)
Glucose, Bld: 199 mg/dL — ABNORMAL HIGH (ref 70–99)
Potassium: 3.2 mmol/L — ABNORMAL LOW (ref 3.5–5.1)
Sodium: 137 mmol/L (ref 135–145)

## 2022-05-06 LAB — MAGNESIUM: Magnesium: 1.8 mg/dL (ref 1.7–2.4)

## 2022-05-06 LAB — TROPONIN I (HIGH SENSITIVITY): Troponin I (High Sensitivity): 5 ng/L (ref <18)

## 2022-05-06 NOTE — Discharge Instructions (Addendum)
Take your normal medications as prescribed.  Follow-up with cardiology; a referral order has been placed so you should get a phone call to arrange an appointment.  If you have a recurrent episode of the palpitations and elevated heart rate you can try vagal maneuvers (breathing into a syringe or bag, Valsalva maneuver which is bearing down like you are having a bowel movement, or putting her face in cold water).  If these do not work or if you have any persistent palpitations, lightheadedness, feeling like you are in a pass out, chest pain, or difficulty breathing, you should return to the ER immediately.

## 2022-05-06 NOTE — ED Provider Notes (Signed)
[facility] Provider Note    Event Date/Time   First MD Initiated Contact with Patient 05/06/22 0304     (approximate)   History   Palpitations   HPI  Laurie Ortiz is a 32 y.o. female with a history of diabetes and hyperlipidemia who presents with palpitations, acute onset this morning.  The patient states that they awoke her from sleep.  She felt like her heart was racing.  She reports associated lightheadedness and shortness of breath but denies chest pain.  She has not had any episode like this previously.  The patient initially thought she was having a panic attack but then the symptoms did not resolve.  EMS found her to be in SVT with a rate over 200.   I reviewed the past medical records.  The patient's most recent outpatient encounter was with [facility] on 04/18/2022 for vaginal discharge.  She was diagnosed with bacterial vaginosis.  She has no recent ED visits or admissions.  Physical Exam   Triage Vital Signs: ED Triage Vitals [05/06/22 0304]  Enc Vitals Group     BP      Pulse Rate 96     Resp (!) 21     Temp 98.4 F (36.9 C)     Temp Source Oral     SpO2 99 %     Weight      Height      Head Circumference      Peak Flow      Pain Score      Pain Loc      Pain Edu?      Excl. in [place]?     Most recent vital signs: Vitals:   05/06/22 0400 05/06/22 0415  Pulse: 94 100  Resp: 14 (!) 22  Temp:    SpO2: 97% 96%     General: Awake, no distress.  CV:  Good peripheral perfusion.  Normal heart sounds. Resp:  Lungs CTAB.  Normal effort.  Abd:  No distention.  Other:  No peripheral edema   ED Results / Procedures / Treatments   Labs (all labs ordered are listed, but only abnormal results are displayed) Labs Reviewed  BASIC METABOLIC PANEL - Abnormal; Notable for the following components:      Result Value   Potassium 3.2 (*)    CO2 20 (*)    Glucose, Bld 199 (*)    Calcium 8.0 (*)    All other components within  normal limits  CBC  MAGNESIUM  HCG, QUANTITATIVE, PREGNANCY  TROPONIN I (HIGH SENSITIVITY)     EKG  ED ECG REPORT I, Arta Silence, the attending physician, personally viewed and interpreted this ECG.  Date: 05/06/2022 EKG Time: 0317 Rate: 100 Rhythm: normal sinus rhythm QRS Axis: normal Intervals: Borderline prolonged QTc ST/T Wave abnormalities: normal Narrative Interpretation: no evidence of acute ischemia    RADIOLOGY  Chest x-ray: I independently viewed and interpreted the images; there is no focal consolidation or edema   PROCEDURES:  Critical Care performed: No  Procedures   MEDICATIONS ORDERED IN ED: Medications - No data to display   IMPRESSION / MDM / [place] / ED COURSE  I reviewed the triage vital signs and the nursing notes.  32 year old female with PMH as noted above presents with acute onset of palpitations that awoke her from sleep early this morning.  She was found by EMS to be in SVT.  She performed vagal maneuvers and has now  converted back to sinus rhythm.  She is currently asymptomatic.  Differential diagnosis includes, but is not limited to, SVT, less likely other narrow complex tachydysrhythmia.  I have the EMS EKG rhythm strip during the incident and it appears consistent with SVT.  We will obtain basic labs, magnesium, troponin, chest x-ray, and reassess.  Anticipate discharge home if the workup is negative.  Patient's presentation is most consistent with acute complicated illness / injury requiring diagnostic workup.  The patient is on the cardiac monitor to evaluate for evidence of arrhythmia and/or significant heart rate changes.  ----------------------------------------- 5:21 AM on 05/06/2022 -----------------------------------------  Lab workup is unremarkable.  Potassium is borderline low but this appears to be the patient's baseline.  The other electrolytes are normal.  There is no leukocytosis.  Troponin is  negative.  There is no indication for a repeat.  Chest x-ray shows no acute findings.  The patient remains within normal heart rate and in sinus rhythm.  She is asymptomatic at this time.  Presentation is consistent with uncomplicated SVT.  The patient is stable for discharge home at this time.  I have provided cardiology referral.  I gave the patient strict return precautions and she expressed understanding.  FINAL CLINICAL IMPRESSION(S) / ED DIAGNOSES   Final diagnoses:  SVT (supraventricular tachycardia)     Rx / DC Orders   ED Discharge Orders          Ordered    Ambulatory referral to Cardiology       Comments: If you have not heard from the Cardiology office within the next 72 hours please call [phone].   05/06/22 0519             Note:  This document was prepared using Dragon voice recognition software and may include unintentional dictation errors.    Arta Silence, MD 05/06/22 [phone]

## 2022-05-06 NOTE — ED Triage Notes (Signed)
Pt arrived via [facility] from home with sudden onset of palpitations and the feeling of suffocation that woke pt from her sleep. Pt initial HR 220bpm and with vasovagal maneuvers, pt converted from SVT to sinus tack at 120bpm. No prior hx. Pt denies taking new medication prior to med or in last 24 hours.

## 2022-06-12 ENCOUNTER — Other Ambulatory Visit (HOSPITAL_COMMUNITY): Payer: Self-pay

## 2022-06-29 ENCOUNTER — Encounter: Payer: Self-pay | Admitting: Obstetrics and Gynecology

## 2022-06-30 ENCOUNTER — Other Ambulatory Visit: Payer: Self-pay | Admitting: Obstetrics and Gynecology

## 2022-06-30 MED ORDER — SULFAMETHOXAZOLE-TRIMETHOPRIM 800-160 MG PO TABS: 1.0000 | ORAL_TABLET | Freq: Two times a day (BID) | ORAL | 0 refills | Status: AC

## 2022-06-30 NOTE — Telephone Encounter (Signed)
Pls advise.  

## 2022-06-30 NOTE — Progress Notes (Signed)
Rx bactrim for UTI sx. Can't come in for UA due to work schedule.

## 2022-07-31 ENCOUNTER — Ambulatory Visit (INDEPENDENT_AMBULATORY_CARE_PROVIDER_SITE_OTHER): Payer: Commercial Managed Care - PPO | Admitting: Licensed Practical Nurse

## 2022-07-31 ENCOUNTER — Other Ambulatory Visit (HOSPITAL_COMMUNITY)
Admission: RE | Admit: 2022-07-31 | Discharge: 2022-07-31 | Disposition: A | Discharge status: Home / Self Care | Payer: Commercial Managed Care - PPO | Source: Ambulatory Visit | Attending: Licensed Practical Nurse | Admitting: Licensed Practical Nurse

## 2022-07-31 VITALS — BP 124/83 | HR 94 | Ht 65.0 in | Wt 217.0 lb

## 2022-07-31 DIAGNOSIS — N898 Other specified noninflammatory disorders of vagina: Secondary | ICD-10-CM | POA: Diagnosis present

## 2022-07-31 DIAGNOSIS — T8332XA Displacement of intrauterine contraceptive device, initial encounter: Secondary | ICD-10-CM | POA: Diagnosis not present

## 2022-07-31 DIAGNOSIS — Z113 Encounter for screening for infections with a predominantly sexual mode of transmission: Secondary | ICD-10-CM

## 2022-07-31 DIAGNOSIS — Z124 Encounter for screening for malignant neoplasm of cervix: Secondary | ICD-10-CM

## 2022-07-31 NOTE — Progress Notes (Signed)
  HPI:      [name] is a 32 y.o. G1P1001 who LMP was No LMP recorded., presents today for a problem visit.  She complains of:  Vaginitis: Patient complains of an abnormal vaginal discharge for 2 days. Vaginal symptoms include none.Vulvar symptoms include  bleeding-has seen red streaks when wiping, and odor .STI Risk: Very low risk of STD exposureDischarge described as:  normal except with blood .Other associated symptoms: none.Menstrual pattern: She had been not had a cycle since about 2 months after her Mirena IUD was placed in 10/2021 . Contraception: IUD She is sexually active with 1 female partner. Denies UTI symptoms.  She was treated for BV in January. She had similar symptoms at that time.   Last pap 2020, WNL HPV not tested   PMHx: She  has a past medical history of Complication of anesthesia, COVID-19 (11/12/2018), Diabetes mellitus without complication (HCC), Family history of adverse reaction to anesthesia, Genital herpes (10/2018), History of chlamydia (08/2015), History of Papanicolaou smear of cervix (04/22/13; 08/30/15), History of vaginitis, Hypercholesteremia, and Obesity (BMI 30.0-34.9). Also,  has a past surgical history that includes Wisdom tooth extraction; Vagina surgery; Nasal septoplasty w/ turbinoplasty (Bilateral, 03/04/2019); and Gastric bypass (04/09/2020)., family history includes Breast cancer in her paternal grandmother; Dementia in her paternal grandfather; Heart disease in her maternal grandmother; Hypertension in her mother; Parkinson's disease in her paternal grandfather; Prostate cancer in her paternal grandfather.,  reports that she has never smoked. She has never used smokeless tobacco. She reports that she does not currently use alcohol. She reports that she does not use drugs.  She has a current medication list which includes the following prescription(s): atorvastatin, freestyle libre 2 sensor, humalog kwikpen, levonorgestrel, toujeo max solostar,  valacyclovir, insulin aspart, and metronidazole. Also, is allergic to penicillins and amoxicillin.  ROS see HPI   Objective: BP 124/83   Pulse 94   Ht  (1.651 m)   Wt 217 lb (98.4 kg)   BMI 36.11 kg/m  Physical Exam Constitutional:      Appearance: Normal appearance.  Genitourinary:     Vulva normal.     Genitourinary Comments: SSE cervix pink no lesions, small amount of mucus-blood discharge present. IUD strings not visualized. No obvious odor   Bimanual exam: uterus non gravid, non tender nor masses. Adnexa non tender no masses, not enlarged, No CMT   Abdominal:     General: Abdomen is flat.     Tenderness: There is no abdominal tenderness.  Neurological:     Mental Status: She is alert.     ASSESSMENT/PLAN:    Problem List Items Addressed This Visit   None Visit Diagnoses     Vaginal odor    -  Primary   Relevant Orders   Cervicovaginal ancillary only   Intrauterine contraceptive device threads lost, initial encounter       Relevant Orders   US PELVIS TRANSVAGINAL NON-OB (TV ONLY)   Cervical cancer screening       Screening examination for venereal disease       Relevant Orders   Cytology - PAP   Cervicovaginal ancillary only      -Will call or send mychart message wit any abnormal results.  -Pt does not consume alcohol on a regular basis, aware to abstain from alcohol while on Flagyl if BV is diagnosed.     Carie Caddy, CNM   [facility]  07/31/22  12:43 PM

## 2022-08-01 LAB — CERVICOVAGINAL ANCILLARY ONLY
Bacterial Vaginitis (gardnerella): POSITIVE — AB
Candida Glabrata: POSITIVE — AB
Candida Vaginitis: NEGATIVE
Comment: NEGATIVE
Comment: NEGATIVE
Comment: NEGATIVE
Comment: NEGATIVE
Trichomonas: NEGATIVE

## 2022-08-02 ENCOUNTER — Encounter: Payer: Self-pay | Admitting: Licensed Practical Nurse

## 2022-08-02 ENCOUNTER — Other Ambulatory Visit: Payer: Self-pay | Admitting: Licensed Practical Nurse

## 2022-08-02 DIAGNOSIS — B9689 Other specified bacterial agents as the cause of diseases classified elsewhere: Secondary | ICD-10-CM

## 2022-08-02 DIAGNOSIS — B3731 Acute candidiasis of vulva and vagina: Secondary | ICD-10-CM

## 2022-08-02 MED ORDER — FLUCONAZOLE 150 MG PO TABS: 150.0000 mg | ORAL_TABLET | ORAL | 0 refills | Status: AC

## 2022-08-02 MED ORDER — METRONIDAZOLE 500 MG PO TABS: 500.0000 mg | ORAL_TABLET | Freq: Two times a day (BID) | ORAL | 0 refills | Status: DC

## 2022-08-02 NOTE — Progress Notes (Signed)
Pt seen 4/18 for abnormal discharge and odor, swab shows BV and candida glabrata. Scripts sent to pharmacy on file. Mychart message sent to pt Laurie Ortiz  [facility]  08/02/22  8:37 PM

## 2022-08-05 ENCOUNTER — Ambulatory Visit (INDEPENDENT_AMBULATORY_CARE_PROVIDER_SITE_OTHER): Payer: Commercial Managed Care - PPO

## 2022-08-05 DIAGNOSIS — T8332XA Displacement of intrauterine contraceptive device, initial encounter: Secondary | ICD-10-CM

## 2022-08-06 LAB — CYTOLOGY - PAP
Chlamydia: NEGATIVE
Comment: NEGATIVE
Comment: NEGATIVE
Comment: NORMAL
High risk HPV: NEGATIVE
Neisseria Gonorrhea: NEGATIVE

## 2022-08-07 ENCOUNTER — Telehealth: Payer: Self-pay

## 2022-08-07 NOTE — Telephone Encounter (Signed)
Patient contact office wanting to speak with Laurie Ortiz in regards to her recent pap results and next steps that she needs to take. Patient advised that Laurie Ortiz will return back in office on 08/08/22. KW

## 2022-08-12 ENCOUNTER — Encounter: Payer: Self-pay | Admitting: Licensed Practical Nurse

## 2022-09-24 ENCOUNTER — Telehealth: Payer: Self-pay

## 2022-09-24 ENCOUNTER — Ambulatory Visit: Payer: Commercial Managed Care - PPO

## 2022-09-24 ENCOUNTER — Other Ambulatory Visit: Payer: Self-pay

## 2022-09-24 DIAGNOSIS — B3731 Acute candidiasis of vulva and vagina: Secondary | ICD-10-CM

## 2022-09-24 DIAGNOSIS — B9689 Other specified bacterial agents as the cause of diseases classified elsewhere: Secondary | ICD-10-CM

## 2022-09-24 MED ORDER — FLUCONAZOLE 150 MG PO TABS: 150.0000 mg | ORAL_TABLET | Freq: Once | ORAL | 0 refills | Status: AC

## 2022-09-24 MED ORDER — METRONIDAZOLE 500 MG PO TABS: 500.0000 mg | ORAL_TABLET | Freq: Two times a day (BID) | ORAL | 0 refills | Status: DC

## 2022-09-24 NOTE — Telephone Encounter (Signed)
Pt was on nurse schedule today for self swab for possible BV and yeast. She was pos BV and yeast in January and April. Per Dr. Valentino Saxon, we can send her Rx RF this time, but if sx recur again, she needs f/u appt. Pt aware.

## 2022-11-12 ENCOUNTER — Ambulatory Visit: Payer: Commercial Managed Care - PPO | Admitting: Licensed Practical Nurse

## 2022-11-13 ENCOUNTER — Encounter: Payer: Self-pay | Admitting: Obstetrics and Gynecology

## 2022-11-13 ENCOUNTER — Other Ambulatory Visit (HOSPITAL_COMMUNITY)
Admission: RE | Admit: 2022-11-13 | Discharge: 2022-11-13 | Disposition: A | Discharge status: Home / Self Care | Payer: Commercial Managed Care - PPO | Source: Ambulatory Visit | Attending: Licensed Practical Nurse | Admitting: Licensed Practical Nurse

## 2022-11-13 ENCOUNTER — Ambulatory Visit (INDEPENDENT_AMBULATORY_CARE_PROVIDER_SITE_OTHER): Payer: Commercial Managed Care - PPO | Admitting: Obstetrics and Gynecology

## 2022-11-13 VITALS — BP 110/74 | HR 92 | Ht 65.0 in | Wt 217.0 lb

## 2022-11-13 DIAGNOSIS — Z3169 Encounter for other general counseling and advice on procreation: Secondary | ICD-10-CM

## 2022-11-13 DIAGNOSIS — N898 Other specified noninflammatory disorders of vagina: Secondary | ICD-10-CM | POA: Insufficient documentation

## 2022-11-13 DIAGNOSIS — Z30432 Encounter for removal of intrauterine contraceptive device: Secondary | ICD-10-CM

## 2022-11-13 LAB — POCT WET PREP WITH KOH
Clue Cells Wet Prep HPF POC: NEGATIVE
KOH Prep POC: NEGATIVE
Trichomonas, UA: NEGATIVE
Yeast Wet Prep HPF POC: NEGATIVE

## 2022-11-13 NOTE — Progress Notes (Signed)
[facility]   Chief Complaint  Patient presents with   Vaginal Discharge    X 1 week and a half, no itching, irritation or odor   IUD removal    Plans to conceive    HPI:      [name] is a 32 y.o. G1P1001 whose LMP was No LMP recorded. (Menstrual status: IUD)., presents today for IUD removal for conception. Mirena placed 7/23, pt is amenorrheic. Not taking PNVs. Will need to stop lipitor. Has hx of DM. Takes valtrex daily as HSV prevention.   Pt also had increased vag d/c with itching/irritation over a wk ago and burning during sex. No fishy odor. Treated with monistat-3 with some improvement, but still has vag d/c . Hx of recurent BV; flagyl and diflucan eRxd 6/24. Pt uses dove sens skin soap; partner using [place]. No urin sx.   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   Gastritis without bleeding 04/26/2020   S/P gastric bypass 04/09/2020   Sleep apnea 03/20/2020   Hyperlipidemia 02/02/2020   Bacterial vaginosis 10/03/2019   Nausea    Hypokalemia 03/28/2019   Obesity, Class III, BMI 40-49.9 (morbid obesity) (HCC) 03/28/2019   History of 2019 novel coronavirus disease (COVID-19) 03/27/2019   Uncontrolled type 1 diabetes mellitus with hyperglycemia (HCC) 02/02/2019   Genital herpes 10/2018   DKA (diabetic ketoacidoses) 06/30/2018   Chlamydia 09/18/2016    Past Surgical History:  Procedure Laterality Date   GASTRIC BYPASS  04/09/2020   NASAL SEPTOPLASTY W/ TURBINOPLASTY Bilateral 03/04/2019   Procedure: NASAL SEPTOPLASTY WITH SUBMUCOUS RESECTION OF NASAL TURBINATES;  Surgeon: Linus Salmons, MD;  Location: [facility];  Service: ENT;  Laterality: Bilateral;  Diabetic - insulin   VAGINA SURGERY     age 89 or 28   WISDOM  TOOTH EXTRACTION      Family History  Problem Relation Age of Onset   Hypertension Mother    Heart disease Maternal Grandmother    Breast cancer Paternal Grandmother        breast cancer in late 40s-died from breast cancer   Prostate cancer Paternal Grandfather    Dementia Paternal Grandfather    Parkinson's disease Paternal Grandfather     Social History   Socioeconomic History   Marital status: Single    Spouse name: Not on file   Number of children: 1   Years of education: 12   Highest education level: Not on file  Occupational History   Occupation: Event organiser: [employer]  Tobacco Use   Smoking status: Never   Smokeless tobacco: Never  Vaping Use   Vaping status: Never Used  Substance and Sexual Activity   Alcohol use: Not Currently   Drug use: No   Sexual activity: Yes    Partners: Male    Birth control/protection: None  Other Topics Concern   Not on file  Social History Narrative   Not on file   Social Determinants of Health   Financial Resource Strain: Not on file  Food Insecurity: No Food Insecurity (07/05/2020)   Received from [facility]   Hunger Vital Sign    Worried About Running Out of Food in the Last Year: Never true    Ran Out of Food in the Last  Year: Never true  Transportation Needs: Not on file  Physical Activity: Not on file  Stress: Not on file  Social Connections: Unknown (08/12/2021)   [facility]   Social Network    Social Network: Not on file  Intimate Partner Violence: Unknown (07/15/2021)   Received from [facility]   HITS    Physically Hurt: Not on file    Insult or Talk Down To: Not on file    Threaten Physical Harm: Not on file    Scream or Curse: Not on file    Outpatient Medications Prior to Visit  Medication Sig Dispense Refill   atorvastatin (LIPITOR) 40 MG tablet Take 40 mg by mouth daily.     Continuous Blood Gluc Sensor (FREESTYLE LIBRE 2 SENSOR) MISC by Does not apply route.     HUMALOG  KWIKPEN 100 UNIT/ML KwikPen Inject into the skin.     levonorgestrel (MIRENA) 20 MCG/DAY IUD 1 each by Intrauterine route once.     TOUJEO MAX SOLOSTAR 300 UNIT/ML Solostar Pen SMARTSIG:60 Unit(s) SUB-Q Every Night     valACYclovir (VALTREX) 500 MG tablet Take 1 tablet (500 mg total) by mouth daily. 90 tablet 0   insulin aspart (NOVOLOG) 100 UNIT/ML injection 10 units subcu with each meals 10 mL 3   metroNIDAZOLE (FLAGYL) 500 MG tablet Take 1 tablet (500 mg total) by mouth 2 (two) times daily. 14 tablet 0   No facility-administered medications prior to visit.      ROS:  Review of Systems  Constitutional:  Negative for fever.  Gastrointestinal:  Negative for blood in stool, constipation, diarrhea, nausea and vomiting.  Genitourinary:  Positive for dyspareunia, dysuria and vaginal discharge. Negative for flank pain, frequency, hematuria, urgency, vaginal bleeding and vaginal pain.  Musculoskeletal:  Negative for back pain.  Skin:  Negative for rash.   BREAST: No symptoms   OBJECTIVE:   Vitals:  BP 110/74   Pulse 92   Ht 5\' 5"  (1.651 m)   Wt 217 lb (98.4 kg)   BMI 36.11 kg/m   Physical Exam Vitals reviewed.  Constitutional:      Appearance: She is well-developed.  Pulmonary:     Effort: Pulmonary effort is normal.  Genitourinary:    Pubic Area: No rash.      Labia:        Right: No rash, tenderness or lesion.        Left: Lesion present. No rash or tenderness.      Vagina: Vaginal discharge present. No erythema or tenderness.     Cervix: Normal.     Uterus: Normal. Not enlarged and not tender.      Adnexa: Right adnexa normal and left adnexa normal.       Right: No mass or tenderness.         Left: No mass or tenderness.         Comments: IUD STRINGS NOT IN CX OS Musculoskeletal:        General: Normal range of motion.     Cervical back: Normal range of motion.  Skin:    General: Skin is warm and dry.  Neurological:     General: No focal deficit present.      Mental Status: She is alert and oriented to person, place, and time.  Psychiatric:        Mood and Affect: Mood normal.        Behavior: Behavior normal.        Thought Content:  Thought content normal.        Judgment: Judgment normal.     Results: Results for orders placed or performed in visit on 11/13/22 (from the past 24 hour(s))  POCT Wet Prep with KOH     Status: Normal   Collection Time: 11/13/22 12:51 PM  Result Value Ref Range   Trichomonas, UA Negative    Clue Cells Wet Prep HPF POC neg    Epithelial Wet Prep HPF POC     Yeast Wet Prep HPF POC neg    Bacteria Wet Prep HPF POC     RBC Wet Prep HPF POC     WBC Wet Prep HPF POC     KOH Prep POC Negative Negative     IUD Removal Strings of IUD identified and grasped.  IUD removed without problem with Boseman forceps.  Pt tolerated this well.  IUD noted to be intact.   Assessment/Plan: Vaginal discharge - Plan: Cervicovaginal ancillary only, POCT Wet Prep with KOH; improving sx, neg wet prep. Check culture. Will treat if pos. Partner to use dove sens skin soap. F/u prn.   Encounter for IUD removal  Pre-conception counseling--start PNVs, d/c lipitor (discuss with PCP).     Return if symptoms worsen or fail to improve./ prn NOB appt  Vasilis Luhman B. Malya Cirillo, PA-C 11/13/2022 12:51 PM

## 2022-11-13 NOTE — Patient Instructions (Signed)
I value your feedback and you entrusting us with your care. If you get a [facility] patient survey, I would appreciate you taking the time to let us know about your experience today. Thank you! ? ? ?

## 2022-11-15 ENCOUNTER — Other Ambulatory Visit: Payer: Self-pay | Admitting: Obstetrics and Gynecology

## 2022-11-15 DIAGNOSIS — A6004 Herpesviral vulvovaginitis: Secondary | ICD-10-CM

## 2022-11-19 ENCOUNTER — Encounter: Payer: Self-pay | Admitting: Obstetrics and Gynecology

## 2022-11-26 MED ORDER — VALACYCLOVIR HCL 500 MG PO TABS: 500.0000 mg | ORAL_TABLET | Freq: Every day | ORAL | 0 refills | Status: DC

## 2022-11-26 NOTE — Telephone Encounter (Addendum)
Patient following up on request she had pharmacy send [place] for refill of valtrex. Rx was denied on 11/15/22 stating pt needed appointment. However, patient was seen on 11/13/22. Advised patient refill will be sent.

## 2022-11-26 NOTE — Addendum Note (Signed)
Addended by: Kathlene Cote on: 11/26/2022 03:34 PM   Modules accepted: Orders

## 2022-12-02 ENCOUNTER — Other Ambulatory Visit: Payer: Self-pay | Admitting: Obstetrics and Gynecology

## 2022-12-02 DIAGNOSIS — A6004 Herpesviral vulvovaginitis: Secondary | ICD-10-CM

## 2023-03-09 ENCOUNTER — Other Ambulatory Visit: Payer: Self-pay

## 2023-03-09 ENCOUNTER — Emergency Department
Admission: EM | Admit: 2023-03-09 | Discharge: 2023-03-09 | Disposition: A | Discharge status: Home / Self Care | Payer: Commercial Managed Care - PPO | Attending: Emergency Medicine | Admitting: Emergency Medicine

## 2023-03-09 ENCOUNTER — Encounter: Payer: Self-pay | Admitting: Emergency Medicine

## 2023-03-09 ENCOUNTER — Emergency Department: Payer: Commercial Managed Care - PPO

## 2023-03-09 DIAGNOSIS — J019 Acute sinusitis, unspecified: Secondary | ICD-10-CM | POA: Insufficient documentation

## 2023-03-09 DIAGNOSIS — E109 Type 1 diabetes mellitus without complications: Secondary | ICD-10-CM | POA: Insufficient documentation

## 2023-03-09 DIAGNOSIS — Z20822 Contact with and (suspected) exposure to covid-19: Secondary | ICD-10-CM | POA: Insufficient documentation

## 2023-03-09 DIAGNOSIS — J069 Acute upper respiratory infection, unspecified: Secondary | ICD-10-CM | POA: Diagnosis not present

## 2023-03-09 DIAGNOSIS — Z8616 Personal history of COVID-19: Secondary | ICD-10-CM | POA: Diagnosis not present

## 2023-03-09 DIAGNOSIS — R059 Cough, unspecified: Secondary | ICD-10-CM | POA: Diagnosis present

## 2023-03-09 LAB — RESP PANEL BY RT-PCR (RSV, FLU A&B, COVID)  RVPGX2
Influenza A by PCR: NEGATIVE
Influenza B by PCR: NEGATIVE
Resp Syncytial Virus by PCR: NEGATIVE
SARS Coronavirus 2 by RT PCR: NEGATIVE

## 2023-03-09 MED ORDER — ACETAMINOPHEN 325 MG PO TABS
650.0000 mg | ORAL_TABLET | Freq: Once | ORAL | Status: AC
  Administered 2023-03-09: 650 mg via ORAL

## 2023-03-09 MED ORDER — DOXYCYCLINE MONOHYDRATE 100 MG PO TABS: 100.0000 mg | ORAL_TABLET | Freq: Two times a day (BID) | ORAL | 0 refills | Status: AC

## 2023-03-09 NOTE — ED Provider Notes (Signed)
[facility] Provider Note    Event Date/Time   First MD Initiated Contact with Patient 03/09/23 1251     (approximate)   History   Cough   HPI  Laurie Ortiz is a 32 y.o. female with a past medical history of diabetes, obesity, hyperlipidemia who presents today for evaluation of cough, nasal congestion and sinus pressure for the last 3 weeks.  Patient reports that her son has been sick with a cold as well.  She also reports that she has left ear pain.  She denies sore throat.  She reports that she has a productive cough that she feels is worsening.  She denies chest pain or shortness of breath.  She denies abdominal pain, nausea, vomiting, diarrhea.  She has not had a fever or chills.  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   Gastritis without bleeding 04/26/2020   S/P gastric bypass 04/09/2020   Sleep apnea 03/20/2020   Hyperlipidemia 02/02/2020   Bacterial vaginosis 10/03/2019   Nausea    Hypokalemia 03/28/2019   Obesity, Class III, BMI 40-49.9 (morbid obesity) (HCC) 03/28/2019   History of 2019 novel coronavirus disease (COVID-19) 03/27/2019   Uncontrolled type 1 diabetes mellitus with hyperglycemia (HCC) 02/02/2019   Genital herpes 10/2018   DKA (diabetic ketoacidosis) (HCC) 06/30/2018   Chlamydia 09/18/2016          Physical Exam   Triage Vital Signs: ED Triage Vitals [03/09/23 1243]  Encounter Vitals Group     BP 129/88     Systolic BP Percentile      Diastolic BP Percentile      Pulse Rate (!) 114     Resp 18     Temp 98.4 F (36.9 C)     Temp Source Oral     SpO2 97 %     Weight 205 lb (93 kg)     Height 5\' 5"  (1.651 m)     Head Circumference      Peak Flow      Pain Score 7     Pain Loc      Pain Education      Exclude from Growth Chart     Most recent vital  signs: Vitals:   03/09/23 1243 03/09/23 1432  BP: 129/88 120/80  Pulse: (!) 114 100  Resp: 18 18  Temp: 98.4 F (36.9 C)   SpO2: 97% 98%    Physical Exam Vitals and nursing note reviewed.  Constitutional:      General: Awake and alert. No acute distress.    Appearance: Normal appearance. The patient is obese.  HENT:     Head: Normocephalic and atraumatic.     Mouth: Mucous membranes are moist. Uvula midline.  No tonsillar exudate.  No soft palate fluctuance.  No trismus.  No voice change.  No sublingual swelling.  No tender cervical lymphadenopathy.  No nuchal rigidity Bilateral ears with clear canals, normal appearing TMs.  No mastoid tenderness or erythema.  No proptosis of the pinna bilaterally. Eyes:     General: PERRL. Normal EOMs        Right eye: No discharge.        Left eye: No discharge.     Conjunctiva/sclera: Conjunctivae normal.  Cardiovascular:     Rate and Rhythm: Normal rate and regular rhythm.  Pulses: Normal pulses.  Pulmonary:     Effort: Pulmonary effort is normal. No respiratory distress.  No accessory muscle use.  Able to speak easily in complete sentences.    Breath sounds: Normal breath sounds.  Abdominal:     Abdomen is soft. There is no abdominal tenderness. No rebound or guarding. No distention. Musculoskeletal:        General: No swelling. Normal range of motion.     Cervical back: Normal range of motion and neck supple.  No lymphadenopathy, normal range of motion of neck. Skin:    General: Skin is warm and dry.     Capillary Refill: Capillary refill takes less than 2 seconds.     Findings: No rash.  Neurological:     Mental Status: The patient is awake and alert.      ED Results / Procedures / Treatments   Labs (all labs ordered are listed, but only abnormal results are displayed) Labs Reviewed  RESP PANEL BY RT-PCR (RSV, FLU A&B, COVID)  [id number]     EKG     RADIOLOGY I independently reviewed and interpreted imaging and agree  with radiologists findings.     PROCEDURES:  Critical Care performed:   Procedures   MEDICATIONS ORDERED IN ED: Medications  acetaminophen (TYLENOL) tablet 650 mg (650 mg Oral Given 03/09/23 1354)     IMPRESSION / MDM / ASSESSMENT AND PLAN / ED COURSE  I reviewed the triage vital signs and the nursing notes.   Differential diagnosis includes, but is not limited to, COVID, influenza, RSV, bronchitis, otitis media, pneumonia, sinusitis.  Patient is awake and alert, tachycardic on arrival and normotensive and afebrile.  She has normal oxygen saturation on room air.  She demonstrates no increased work of breathing.  She is very nontoxic in appearance.  Swabs obtained in triage are negative for COVID, flu, RSV.  Given duration of her symptoms, chest x-ray was also obtained.  She has no chest pain or shortness of breath, no abdominal pain, nausea, vomiting, diarrhea.  Her symptoms appear to be upper respiratory.  No evidence of otitis media on exam.  She has no sore throat, tonsillar exudates, pain with swallowing, fever, tender lymphadenopathy to suggest strep pharyngitis.  Symptoms especially in conjunction with her son having similar symptoms is most suspicious for viral etiology.  However, given the duration of her sinus pressure of 3.5 weeks, will treat for sinusitis.  She has an allergy to amoxicillin, will prescribe doxycycline which will cover sinusitis and atypical pneumonia. She has no focal neurological deficits to suggest cavernous sinus thrombosis or intracranial etiology.  No fever, altered mental status, or neck pain/stiffness to suggest meningitis.  We discussed return precautions and the importance of close outpatient follow-up.  Patient understands and agrees with plan.  She was discharged in stable condition.   Patient's presentation is most consistent with acute complicated illness / injury requiring diagnostic workup.    FINAL CLINICAL IMPRESSION(S) / ED DIAGNOSES    Final diagnoses:  Upper respiratory tract infection, unspecified type  Acute sinusitis, recurrence not specified, unspecified location     Rx / DC Orders   ED Discharge Orders          Ordered    doxycycline (ADOXA) 100 MG tablet  2 times daily        03/09/23 1412             Note:  This document was prepared using Dragon voice recognition software and may include unintentional  dictation errors.   Keturah Shavers 03/09/23 1442    Minna Antis, MD 03/09/23 1513

## 2023-03-09 NOTE — ED Triage Notes (Signed)
Patient to ED via POV fro cough, congestion, and headache x3 week. Worsening headache since yesterday.

## 2023-03-09 NOTE — Discharge Instructions (Addendum)
You may take the medications as prescribed.  Please return for any new, worsening, or change in symptoms or other concerns.  It was a pleasure caring for you today.

## 2023-05-14 ENCOUNTER — Ambulatory Visit (INDEPENDENT_AMBULATORY_CARE_PROVIDER_SITE_OTHER): Payer: BC Managed Care – PPO

## 2023-05-14 VITALS — BP 121/79 | HR 95 | Temp 97.6°F | Ht 65.0 in | Wt 213.4 lb

## 2023-05-14 DIAGNOSIS — R102 Pelvic and perineal pain: Secondary | ICD-10-CM | POA: Diagnosis not present

## 2023-05-14 DIAGNOSIS — M545 Low back pain, unspecified: Secondary | ICD-10-CM | POA: Diagnosis not present

## 2023-05-14 DIAGNOSIS — R300 Dysuria: Secondary | ICD-10-CM

## 2023-05-14 DIAGNOSIS — O26891 Other specified pregnancy related conditions, first trimester: Secondary | ICD-10-CM | POA: Insufficient documentation

## 2023-05-14 LAB — POCT URINALYSIS DIPSTICK
Appearance: NORMAL
Bilirubin, UA: NEGATIVE
Blood, UA: NEGATIVE
Glucose, UA: NEGATIVE
Ketones, UA: NEGATIVE
Leukocytes, UA: NEGATIVE
Nitrite, UA: NEGATIVE
Odor: NORMAL
Protein, UA: NEGATIVE
Spec Grav, UA: 1.01 (ref 1.010–1.025)
Urobilinogen, UA: 0.2 U/dL
pH, UA: 5 (ref 5.0–8.0)

## 2023-05-14 NOTE — Progress Notes (Signed)
    NURSE VISIT NOTE  Subjective:    Patient ID: Laurie Ortiz, female    DOB: [birth year], 33 y.o.   MRN: [id number]       HPI  Patient is a 33 y.o. G47P1001 female who presents for dysuria, urinary frequency, urinary urgency, flank pain, pelvic pain, and cloudy malordorous urine for 3 days.  Patient denies genital rash, genital irritation, and vaginal discharge.  Patient does have a history of recurrent UTI.  Patient does not have a history of pyelonephritis.    Objective:    BP 121/79   Pulse 95   Temp 97.6 F (36.4 C)   Ht 5\' 5"  (1.651 m)   Wt 213 lb 6.4 oz (96.8 kg)   LMP 05/09/2023   BMI 35.51 kg/m    Lab Review  No results found for any visits on 05/14/23.  Assessment:   1. Dysuria   2. Acute left-sided low back pain without sciatica   3. Pelvic pain      Plan:   Urine Culture Sent. Maintain adequate hydration.  May use AZO OTC prn.  Follow up if symptoms worsen or fail to improve as anticipated, and as needed.  Will await culture and notify paitent of results.     Rocco Serene, LPN

## 2023-05-14 NOTE — Patient Instructions (Signed)
Pregnancy and Urinary Tract Infection  A urinary tract infection (UTI) is an infection of any part of the urinary tract. This includes the kidneys, the tubes that connect the kidneys to the bladder (ureters), the bladder, and the tube that carries urine out of the body (urethra). These organs make, store, and get rid of urine in the body. Your health care provider may use other names to describe the infection. An upper UTI affects the ureters and kidneys (pyelonephritis). A lower UTI affects the bladder (cystitis) and urethra (urethritis). Most UTIs are caused by bacteria in the genital area, around the entrance to the urinary tract. These bacteria grow and cause irritation and inflammation of the urinary tract. You are more likely to develop a UTI during pregnancy because: The physical and hormonal changes that your body goes through make it easier for bacteria to get into your urinary tract. Your growing baby puts pressure on your bladder and can affect urine flow. Pregnant women with diabetes are at an increased risk for developing a UTI. It is important to recognize and treat UTIs in pregnancy because they can cause serious complications for both you and your baby. How does this affect me? Symptoms of a UTI include: Needing to urinate right away (urgently) and often, even if urinating a small amount. Pain, burning, or having a hard time passing urine. Blood in the urine. Unusual, cloudy, and bad-smelling urine. Pain in the abdomen or lower back. Vaginal discharge. You may also have: Vomiting or a decreased appetite. Confusion. Irritability or tiredness. A fever. Diarrhea. A low level of red blood cells (anemia). The development of high blood pressure during pregnancy (preeclampsia). How does this affect my baby? An untreated UTI during pregnancy could lead to a kidney infection or an infection throughout the mother's body (systemic infection). This can cause health problems and affect the  baby. Possible complications of an untreated UTI include: Your baby being born before 37 weeks of pregnancy (premature). Your baby being born with a low birth weight. Your baby having a higher risk of having his or her skin or the white parts of the eyes turn yellow (jaundice). What can I do to lower my risk? To prevent a UTI: Do not hold urine for long periods of time. Empty your bladder as soon as you feel the urge. Always wipe from front to back, especially after a bowel movement. Use each tissue one time when you wipe. Empty your bladder after sex. Keep your genital area dry. Drink 6 to 8 glasses of water each day. Do not douche or use deodorant sprays. Wear cotton underwear and loose clothing. How is this treated? Treatment for this condition may include: Antibiotic medicines that are safe to take during pregnancy. Other medicines to treat less common causes of UTI. Follow these instructions at home: If you were prescribed an antibiotic medicine, take it as told by your health care provider. Do not stop using the antibiotic even if you start to feel better. Keep all follow-up visits. This is important. Contact a health care provider if: Your symptoms do not improve or they get worse. You have abnormal vaginal discharge. Get help right away if you: Have a fever. Have nausea and vomiting. Have back or side pain. Have lower belly pain, tightness, or feel contractions in your uterus. Have a gush of fluid from your vagina. Have blood in your urine. Summary A UTI is an infection of any part of the urinary tract, which includes the kidneys, ureters, bladder,  and urethra. Most urinary tract infections are caused by bacteria in your genital area, around the entrance to your urinary tract (urethra). You are more likely to develop a UTI during pregnancy. It is important to recognize and treat UTIs in pregnancy because of the risk of serious complications for both you and your baby. If you  were prescribed an antibiotic medicine, take it as told by your health care provider. Do not stop using the antibiotic even if you start to feel better. This information is not intended to replace advice given to you by your health care provider. Make sure you discuss any questions you have with your health care provider. Document Revised: 11/14/2020 Document Reviewed: 11/14/2020 Elsevier Patient Education  2024 [employer].

## 2023-05-16 LAB — URINE CULTURE

## 2023-05-18 ENCOUNTER — Encounter: Payer: Self-pay | Admitting: Obstetrics and Gynecology

## 2023-05-19 ENCOUNTER — Other Ambulatory Visit: Payer: Self-pay | Admitting: Obstetrics and Gynecology

## 2023-05-19 DIAGNOSIS — N760 Acute vaginitis: Secondary | ICD-10-CM

## 2023-05-19 MED ORDER — METRONIDAZOLE 500 MG PO TABS: ORAL_TABLET | ORAL | 0 refills | Status: DC

## 2023-05-19 NOTE — Progress Notes (Signed)
Rx flagyl for BV sx.  

## 2023-06-17 ENCOUNTER — Encounter: Payer: Self-pay | Admitting: Licensed Practical Nurse

## 2023-06-17 ENCOUNTER — Ambulatory Visit (INDEPENDENT_AMBULATORY_CARE_PROVIDER_SITE_OTHER): Admitting: Licensed Practical Nurse

## 2023-06-17 VITALS — BP 124/83 | HR 102 | Ht 65.0 in | Wt 213.9 lb

## 2023-06-17 DIAGNOSIS — N760 Acute vaginitis: Secondary | ICD-10-CM | POA: Diagnosis not present

## 2023-06-17 DIAGNOSIS — B9689 Other specified bacterial agents as the cause of diseases classified elsewhere: Secondary | ICD-10-CM

## 2023-06-17 DIAGNOSIS — B3731 Acute candidiasis of vulva and vagina: Secondary | ICD-10-CM

## 2023-06-17 DIAGNOSIS — N898 Other specified noninflammatory disorders of vagina: Secondary | ICD-10-CM

## 2023-06-17 MED ORDER — FLUCONAZOLE 150 MG PO TABS: 150.0000 mg | ORAL_TABLET | ORAL | 0 refills | Status: AC

## 2023-06-17 MED ORDER — TINIDAZOLE 500 MG PO TABS: 2.0000 g | ORAL_TABLET | Freq: Every day | ORAL | 2 refills | Status: DC

## 2023-06-17 NOTE — Progress Notes (Signed)
 [facility]   No chief complaint on file.   HPI:      Laurie Ortiz is a 33 y.o. G1P1001 whose LMP was No LMP recorded. (Menstrual status: IUD)., presents today for possible yeast infection.   In the beginning of February, Laurie Ortiz was treated with Flagyl for BV. Since then she has noticed and increase in white discharged. Denies odor. She has seen a little spotting. She is on Day six of Monistat. Laurie Ortiz has had multiple episodes of BV and yeast. She had hx of T1DM, reports her sugars were high while she had BV but have improved over the last week. She is sexually active with one female partner, they do not use condoms, she is open to pregnancy. She has used probiotics in the past, she notices she less episodes of BV while on probiotics. She does wear losses clothing and  goes without underwear at night.      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   Gastritis without bleeding 04/26/2020   S/P gastric bypass 04/09/2020   Sleep apnea 03/20/2020   Hyperlipidemia 02/02/2020   Bacterial vaginosis 10/03/2019   Nausea    Hypokalemia 03/28/2019   Obesity, Class III, BMI 40-49.9 (morbid obesity) (HCC) 03/28/2019   History of 2019 novel coronavirus disease (COVID-19) 03/27/2019   Uncontrolled type 1 diabetes mellitus with hyperglycemia (HCC) 02/02/2019   Genital herpes 10/2018   DKA (diabetic ketoacidosis) (HCC) 06/30/2018   Chlamydia 09/18/2016    Past Surgical History:  Procedure Laterality Date   GASTRIC BYPASS  04/09/2020   NASAL SEPTOPLASTY W/ TURBINOPLASTY Bilateral 03/04/2019   Procedure: NASAL SEPTOPLASTY WITH SUBMUCOUS RESECTION OF NASAL  TURBINATES;  Surgeon: Linus Salmons, MD;  [facility];  Service: ENT;  Laterality: Bilateral;  Diabetic - insulin   VAGINA SURGERY     age 30 or 89   WISDOM TOOTH EXTRACTION      Family History  Problem Relation Age of Onset   Hypertension Mother    Heart disease Maternal Grandmother    Breast cancer Paternal Grandmother        breast cancer in late 40s-died from breast cancer   Prostate cancer Paternal Grandfather    Dementia Paternal Grandfather    Parkinson's disease Paternal Grandfather     Social History   Socioeconomic History   Marital status: Single    Spouse name: Not on file   Number of children: 1   Years of education: 12   Highest education level: Not on file  Occupational History   Occupation: Event organiser: [employer]  Tobacco Use   Smoking status: Never   Smokeless tobacco: Never  Vaping Use   Vaping status: Never Used  Substance and Sexual Activity   Alcohol use: Not Currently   Drug use: No   Sexual activity: Yes    Partners: Male    Birth control/protection: None  Other Topics Concern   Not on file  Social History Narrative   Not on file   Social Drivers of Health   Financial Resource Strain: Not on file  Food Insecurity: No Food Insecurity (07/05/2020)   Received from [facility], [facility]   Hunger Vital  Sign    Worried About Programme researcher, broadcasting/film/video in the Last Year: Never true    Ran Out of Food in the Last Year: Never true  Transportation Needs: Not on file  Physical Activity: Not on file  Stress: Not on file  Social Connections: Unknown (08/12/2021)   Received from [facility], [facility]   Social Network    Social Network: Not on file  Intimate Partner Violence: Unknown (07/15/2021)   Received from [facility], [facility]   HITS    Physically Hurt: Not on file    Insult or Talk Down To: Not on file    Threaten Physical Harm: Not on file    Scream or Curse: Not on file    Outpatient Medications  Prior to Visit  Medication Sig Dispense Refill   atorvastatin (LIPITOR) 40 MG tablet Take 40 mg by mouth daily.     Continuous Blood Gluc Sensor (FREESTYLE LIBRE 2 SENSOR) MISC by Does not apply route.     HUMALOG KWIKPEN 100 UNIT/ML KwikPen Inject into the skin.     TOUJEO MAX SOLOSTAR 300 UNIT/ML Solostar Pen SMARTSIG:60 Unit(s) SUB-Q Every Night     valACYclovir (VALTREX) 500 MG tablet TAKE 1 TABLET (500 MG TOTAL) BY MOUTH DAILY**MAX DAY SUPPLY 14 90 tablet 1   insulin aspart (NOVOLOG) 100 UNIT/ML injection 10 units subcu with each meals 10 mL 3   levonorgestrel (MIRENA) 20 MCG/DAY IUD 1 each by Intrauterine route once.     metroNIDAZOLE (FLAGYL) 500 MG tablet Take 1 tab BID for 7 days; NO alcohol use for 10 days after prescription start 14 tablet 0   No facility-administered medications prior to visit.      ROS:  Review of Systems see HPI   OBJECTIVE:   Vitals:  BP 124/83 (BP Location: Left Arm, Patient Position: Sitting)   Pulse (!) 102   Ht 5\' 5"  (1.651 m)   Wt 213 lb 14.4 oz (97 kg)   BMI 35.59 kg/m   Physical Exam Constitutional:      Appearance: Normal appearance.  Cardiovascular:     Rate and Rhythm: Normal rate.  Pulmonary:     Effort: Pulmonary effort is normal.  Genitourinary:    General: Normal vulva.     Comments: Some irritation noted around introitus.  SSE: cervix pink with smooth white discharge adherent to it, scant amount of menses like discharge present.   Wet prep: positive whiff and clue cells, no trich or hyphea Skin:    General: Skin is warm.  Neurological:     General: No focal deficit present.     Mental Status: She is alert.  Psychiatric:        Mood and Affect: Mood normal.     Results: No results found for this or any previous visit (from the past 24 hours).   Assessment/Plan: Vaginal discharge - Plan: NuSwab Vaginitis Plus (VG+), Mycoplasma / Ureaplasma Culture  BV (bacterial vaginosis) - Plan: tinidazole (TINDAMAX) 500 MG  tablet  Vaginal yeast infection - Plan: fluconazole (DIFLUCAN) 150 MG tablet    Meds ordered this encounter  Medications   tinidazole (TINDAMAX) 500 MG tablet    Sig: Take 4 tablets (2,000 mg total) by mouth daily with breakfast. For two days    Dispense:  8 tablet    Refill:  2   fluconazole (DIFLUCAN) 150 MG tablet    Sig: Take 1 tablet (150 mg total) by mouth every 3 (three) days for 2 doses.  Dispense:  2 tablet    Refill:  0   -Will treat for BV tinidazole as pt recently use Flagyl. Script for Diflucan given, pt will take if lab resutls show yeast.  -Consider long term course of Metrogel   Ellouise Newer [facility], CNM 06/17/2023 3:50 PM

## 2023-06-20 LAB — NUSWAB VAGINITIS PLUS (VG+)
Candida albicans, NAA: POSITIVE — AB
Candida glabrata, NAA: POSITIVE — AB
Chlamydia trachomatis, NAA: NEGATIVE
Neisseria gonorrhoeae, NAA: NEGATIVE
Trich vag by NAA: NEGATIVE

## 2023-06-23 ENCOUNTER — Encounter: Payer: Self-pay | Admitting: Licensed Practical Nurse

## 2023-06-24 LAB — MYCOPLASMA / UREAPLASMA CULTURE
Mycoplasma hominis Culture: NEGATIVE
Ureaplasma urealyticum: NEGATIVE

## 2023-08-26 ENCOUNTER — Encounter: Payer: Self-pay | Admitting: Obstetrics & Gynecology

## 2023-08-26 ENCOUNTER — Ambulatory Visit (INDEPENDENT_AMBULATORY_CARE_PROVIDER_SITE_OTHER): Admitting: Obstetrics & Gynecology

## 2023-08-26 DIAGNOSIS — A6004 Herpesviral vulvovaginitis: Secondary | ICD-10-CM

## 2023-08-26 DIAGNOSIS — N61 Mastitis without abscess: Secondary | ICD-10-CM

## 2023-08-26 MED ORDER — VALACYCLOVIR HCL 500 MG PO TABS: 500.0000 mg | ORAL_TABLET | Freq: Every day | ORAL | 5 refills | Status: AC

## 2023-08-26 MED ORDER — CLINDAMYCIN HCL 300 MG PO CAPS: 300.0000 mg | ORAL_CAPSULE | Freq: Three times a day (TID) | ORAL | 0 refills | Status: AC

## 2023-08-26 NOTE — Progress Notes (Signed)
    GYNECOLOGY PROGRESS NOTE  Subjective:    Patient ID: Laurie Ortiz, female    DOB: 1991/01/05, 33 y.o.   MRN: [id number]  HPI  Patient is a 33 y.o. G1P1001 is here for a work in visit for an area of redness and pain in her right breast. She had a similar episode in 10/2021 (but worse then) and she was treated with clindamycin  for 7 days. This fixed her issue. She is allergic to all "cillins". She cannot think of anything that has happened to her or her breast that could have instigated this infection.  She is also requesting a refill of her valtrex .  The following portions of the patient's history were reviewed and updated as appropriate: allergies, current medications, past family history, past medical history, past social history, past surgical history, and problem list.  Review of Systems Pertinent items are noted in HPI.  She is taking PNVs and having unprotected sex with the goal of a pregnancy.  Objective:   Blood pressure 115/76, pulse (!) 122, height 5\' 5"  (1.651 m), weight 212 lb (96.2 kg), last menstrual period 08/09/2023. Body mass index is 35.28 kg/m. Well nourished, well hydrated White female, no apparent distress She is ambulating and conversing normally. Right breast with a 4 cm area of mild erythema and pain with palpation c/w matitis. She has both nipples pierced.   Assessment:   Right mastitis  Plan:   Clindamycin  300 mg x 7 days If this worsens, I have rec'd a consult with general surgery or visit to ED.

## 2023-10-05 ENCOUNTER — Other Ambulatory Visit: Payer: Self-pay
